# Patient Record
Sex: Female | Born: 1980 | Race: White | Hispanic: No | Marital: Married | Smoking: Never smoker
Health system: Southern US, Community
[De-identification: ages and names within clinical notes are randomized; demographics above are authoritative.]

## PROBLEM LIST (undated history)

## (undated) ENCOUNTER — Inpatient Hospital Stay (HOSPITAL_COMMUNITY): Payer: Self-pay

## (undated) DIAGNOSIS — E079 Disorder of thyroid, unspecified: Secondary | ICD-10-CM

## (undated) DIAGNOSIS — R112 Nausea with vomiting, unspecified: Secondary | ICD-10-CM

## (undated) DIAGNOSIS — E282 Polycystic ovarian syndrome: Secondary | ICD-10-CM

## (undated) DIAGNOSIS — Z8639 Personal history of other endocrine, nutritional and metabolic disease: Secondary | ICD-10-CM

## (undated) DIAGNOSIS — D649 Anemia, unspecified: Secondary | ICD-10-CM

## (undated) DIAGNOSIS — H15009 Unspecified scleritis, unspecified eye: Secondary | ICD-10-CM

## (undated) DIAGNOSIS — N809 Endometriosis, unspecified: Secondary | ICD-10-CM

## (undated) DIAGNOSIS — N83209 Unspecified ovarian cyst, unspecified side: Secondary | ICD-10-CM

## (undated) DIAGNOSIS — Z9889 Other specified postprocedural states: Secondary | ICD-10-CM

## (undated) DIAGNOSIS — E039 Hypothyroidism, unspecified: Secondary | ICD-10-CM

## (undated) DIAGNOSIS — R519 Headache, unspecified: Secondary | ICD-10-CM

## (undated) DIAGNOSIS — D259 Leiomyoma of uterus, unspecified: Secondary | ICD-10-CM

## (undated) DIAGNOSIS — R7303 Prediabetes: Secondary | ICD-10-CM

## (undated) HISTORY — PX: OTHER SURGICAL HISTORY: SHX169

## (undated) HISTORY — PX: BREAST BIOPSY: SHX20

## (undated) HISTORY — PX: OVARIAN CYST REMOVAL: SHX89

## (undated) HISTORY — PX: TONSILLECTOMY: SUR1361

## (undated) HISTORY — PX: THYROIDECTOMY: SHX17

## (undated) HISTORY — DX: Unspecified scleritis, unspecified eye: H15.009

---

## 2004-08-23 HISTORY — PX: THYROIDECTOMY: SHX17

## 2010-10-21 ENCOUNTER — Ambulatory Visit: Payer: Self-pay | Admitting: Internal Medicine

## 2011-08-27 ENCOUNTER — Ambulatory Visit: Payer: Self-pay

## 2011-08-27 LAB — PREGNANCY, URINE: Pregnancy Test, Urine: NEGATIVE m[IU]/mL

## 2012-04-21 IMAGING — CR DG ABDOMEN 2V
1 series · 4 of 4 positions shown · non-contrast
Comparison: none

REASON FOR EXAM: Adbominal Pain; Nausea
COMMENTS:   LMP: One week ago

[Series 1: view not recorded · 0.17mm/px · 4 of 4 slices shown]
[im 1/4]
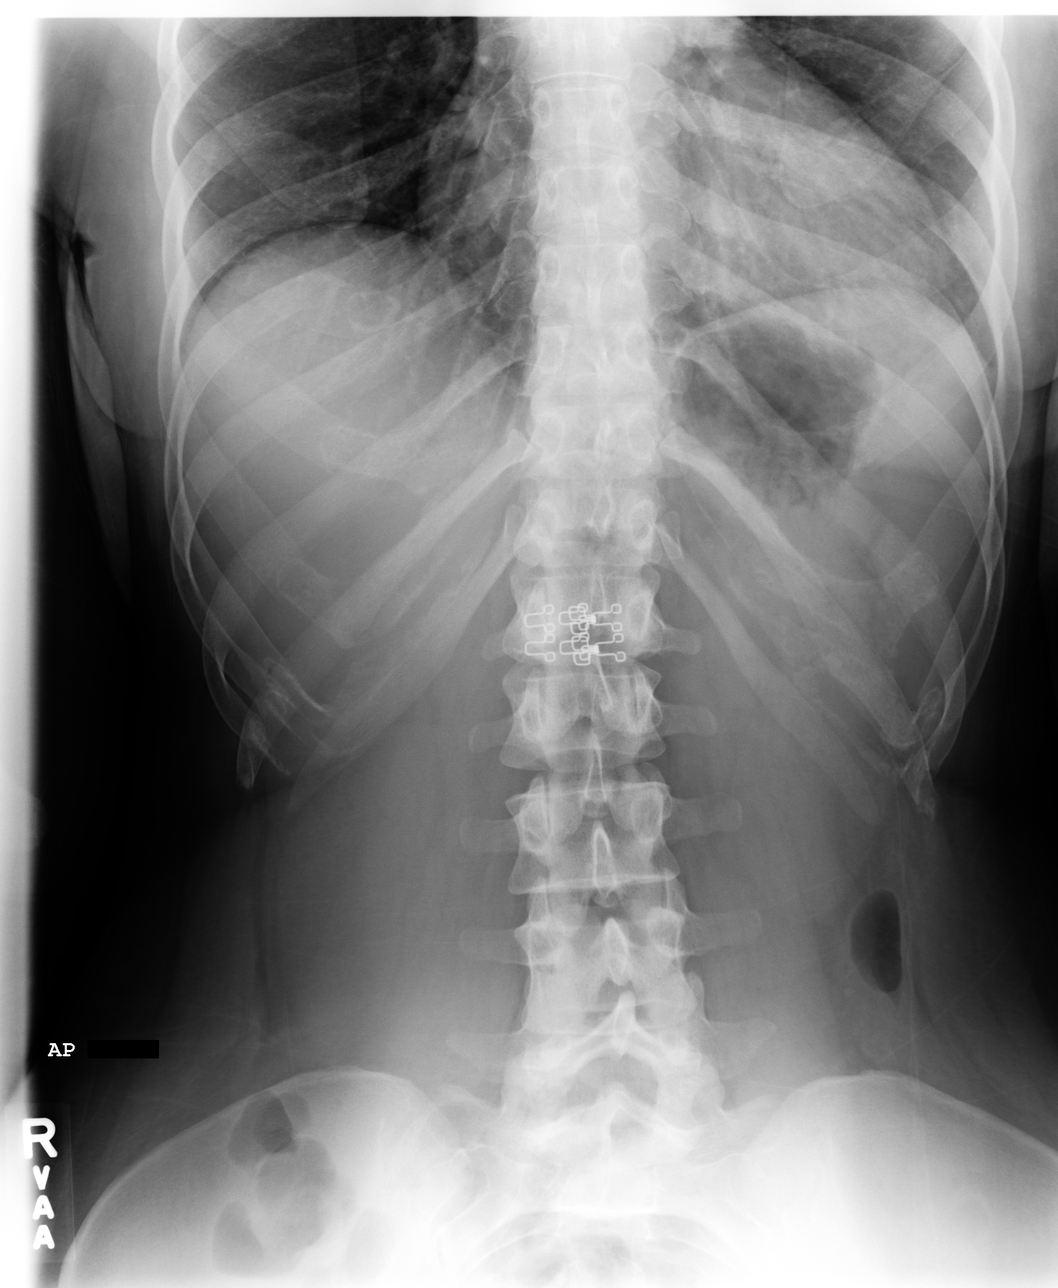
[im 2/4]
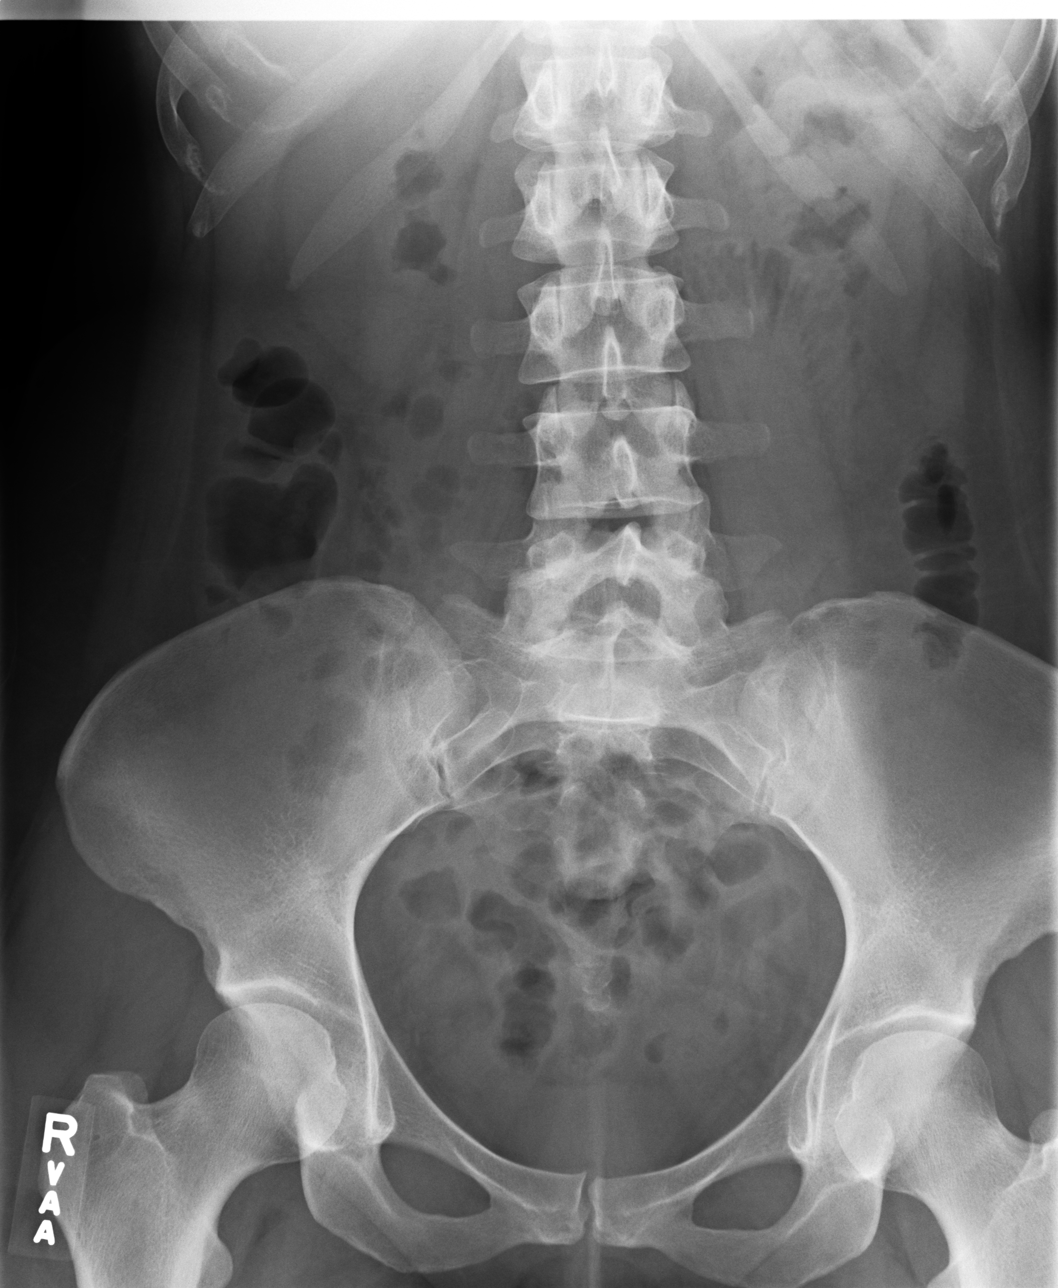
[im 3/4]
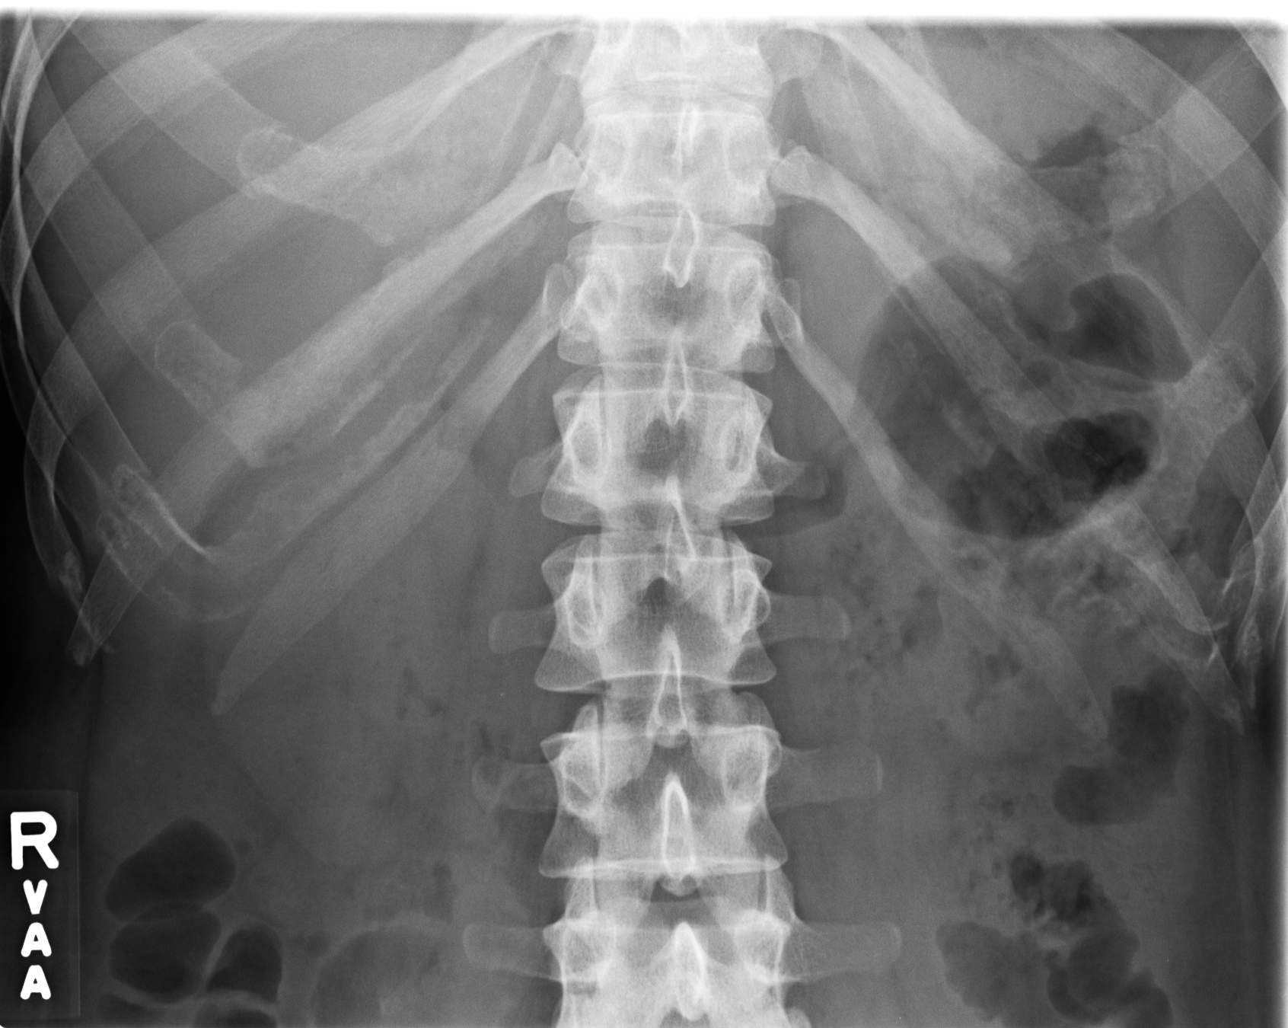
[im 4/4]
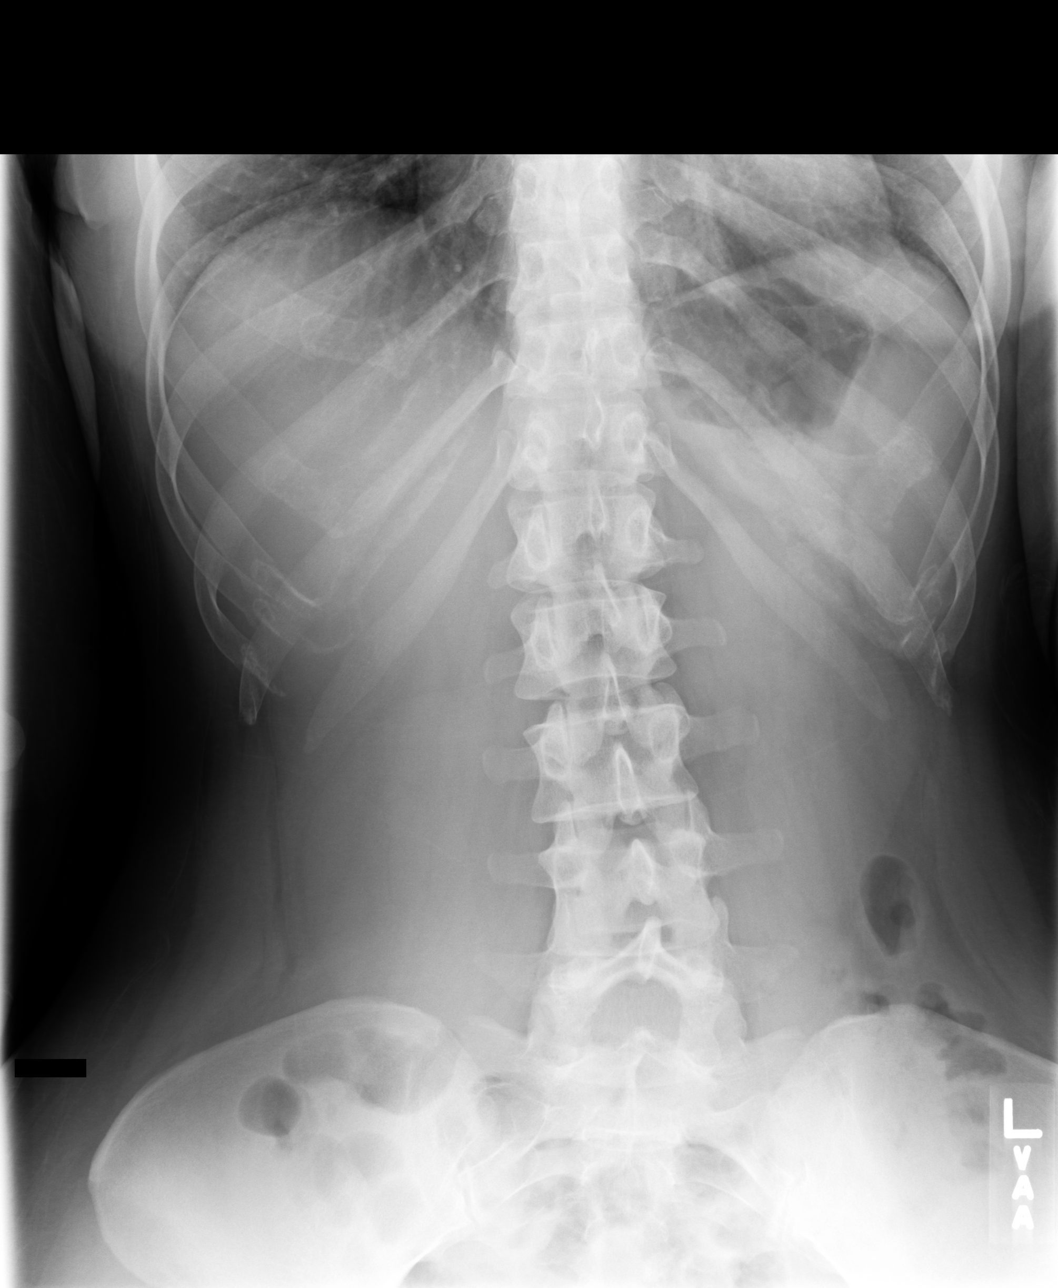

[4 of 4 positions shown; findings below may reference images not displayed]

PROCEDURE:     MDR - MDR ABDOMEN 2V FLAT AND ERECT  - October 21, 2010 [DATE]

RESULT:     Flat and erect views of the abdomen were obtained. No
subdiaphragmatic free air is seen. The bowel gas pattern is normal. No
abnormal intra-abdominal calcifications are seen. The osseous structures are
normal in appearance.
IMPRESSION: No significant abnormalities are noted.

## 2013-10-09 ENCOUNTER — Ambulatory Visit: Payer: Self-pay | Admitting: Family Medicine

## 2013-10-11 ENCOUNTER — Ambulatory Visit (INDEPENDENT_AMBULATORY_CARE_PROVIDER_SITE_OTHER): Payer: Federal, State, Local not specified - PPO | Admitting: Podiatrist

## 2013-10-11 ENCOUNTER — Ambulatory Visit (INDEPENDENT_AMBULATORY_CARE_PROVIDER_SITE_OTHER): Payer: Federal, State, Local not specified - PPO

## 2013-10-11 ENCOUNTER — Encounter: Payer: Self-pay | Admitting: Podiatrist

## 2013-10-11 VITALS — BP 132/85 | HR 81 | Resp 16 | Ht 65.0 in | Wt 200.0 lb

## 2013-10-11 DIAGNOSIS — R52 Pain, unspecified: Secondary | ICD-10-CM

## 2013-10-11 DIAGNOSIS — L923 Foreign body granuloma of the skin and subcutaneous tissue: Secondary | ICD-10-CM

## 2013-10-11 NOTE — Patient Instructions (Signed)
Wood Splinters Wood splinters need to be removed because they can cause skin irritation and infection. If they are close to the surface, splinters can usually be removed easily. Deep splinters may be hard to locate and need treatment by a surgeon. SPLINTER REMOVAL Removal of splinters by your caregiver is considered a surgical procedure.   The area is carefully cleaned. You may require a small amount of anaesthesia (medicine injected near the splinter to numb the tissue and lessen pain). After the splinter is removed, the area will be cleaned again. A bandage is applied.  If your splinter is under a fingernail or toenail, then a small section of the nail may need to be removed. As long as the splinter did not extend to the base of the nail, the nail usually grows back normally.  A splinter that is deeper, more contaminated, or that gets near a structure such as a bone, nerve or blood vessel may need to be removed by a surgeon.  You may need special X-rays or scans if the splinter is hard to locate.  Every attempt is made to remove the entire splinter. However, small particles may remain. Tell your caregiver if you feel that a part of the splinter was left behind. HOME CARE INSTRUCTIONS   Keep the injured area high up (elevated).  Use the injured area as little as possible.  Keep the injured area clean and dry. Follow any directions from your caregiver.  Keep any follow-up or wound check appointments. You might need a tetanus shot now if:  You have no idea when you had the last one.  You have never had a tetanus shot before.  The injured area had dirt in it. Even if you have already removed the splinter, call your caregiver to get a tetanus shot if you need one.  If you need a tetanus shot, and you decide not to get one, there is a rare chance of getting tetanus. Sickness from tetanus can be serious. If you did get a tetanus shot, your arm may swell, get red and warm to the touch at the  shot site. This is common and not a problem. SEEK MEDICAL CARE IF:   A splinter has been removed, but you are not better in a day or two.  You develop a temperature.  Signs of infection develop such as:  Redness, swelling or pus around the wound.  Red streaks spreading back from your wound towards your body. Document Released: 09/16/2004 Document Revised: 11/01/2011 Document Reviewed: 08/19/2008 [employer] Patient Information 2014 [employer].  

## 2013-10-11 NOTE — Progress Notes (Signed)
   Subjective:    Patient ID: Meghan Leach, female    DOB: 03/17/1981, 33 y.o.   MRN: [id number]  HPI Comments: Two weeks ago got a splinter from wood floors in my foot, thought had got it all out , but now it is raised and i cant put any pressure on that part of foot, went to urgent care on Monday they put me on a antibiotic.   patient was seen at the urgent care for bronchitis and now she's on it about for this. She was not put on antibiotics for the splinter. Relates she cannot walk normally on the foot and that she's been trying to get the splinter out and has been unable to.    Review of Systems  Allergic/Immunologic: Positive for environmental allergies.  All other systems reviewed and are negative.       Objective:   Physical Exam  GENERAL APPEARANCE: Alert, conversant. Appropriately groomed. No acute distress.  VASCULAR: Pedal pulses palpable at 2/4 DP and PT bilateral.  Capillary refill time is immediate to all digits,  Proximal to distal cooling it warm to warm.  Digital hair growth is present bilateral  NEUROLOGIC: sensation is intact epicritically and protectively to 5.07 monofilament at 5/5 sites bilateral.  Light touch is intact bilateral, vibratory sensation intact bilateral, achilles tendon reflex is intact bilateral.  MUSCULOSKELETAL: acceptable muscle strength, tone and stability bilateral.  Intrinsic muscluature intact bilateral.  Rectus appearance of foot and digits noted bilateral.   DERMATOLOGIC: Splinter/foreign body is present on the lateral aspect of the left foot mid diaphyseal region of the fifth metatarsal. Direct pressure and lateral compression is uncomfortable. X-rays reveal no sign of a foreign body at the marked site.    Assessment & Plan:  Foreign body/splint her left foot  Plan: Recommended excision of the splinter and the patient agreed I prepped the skin with alcohol and infiltrated 2% lidocaine plain in the area of the splinter. The patient tolerated  this well. I then prepped the skin with Betadine solution and was able to extract the splinter with a #15 blade. The entirety of the splinter was removed. I then applied iodine ointment and a Band-Aid. Instructed the patient to do Epsom salts soaks for a couple of days and the pain should resolve. Offloading pads were also dispensed for her use. She'll be seen back if the pain does not resolve completely.

## 2015-04-10 IMAGING — CR DG CHEST 2V
1 series · 2 of 2 positions shown · non-contrast
Comparison: None.

CLINICAL DATA: Cough.

EXAM:
CHEST  2 VIEW

[Series 1: pa · 0.17mm/px · 2 of 2 slices shown]
[im 1/2]
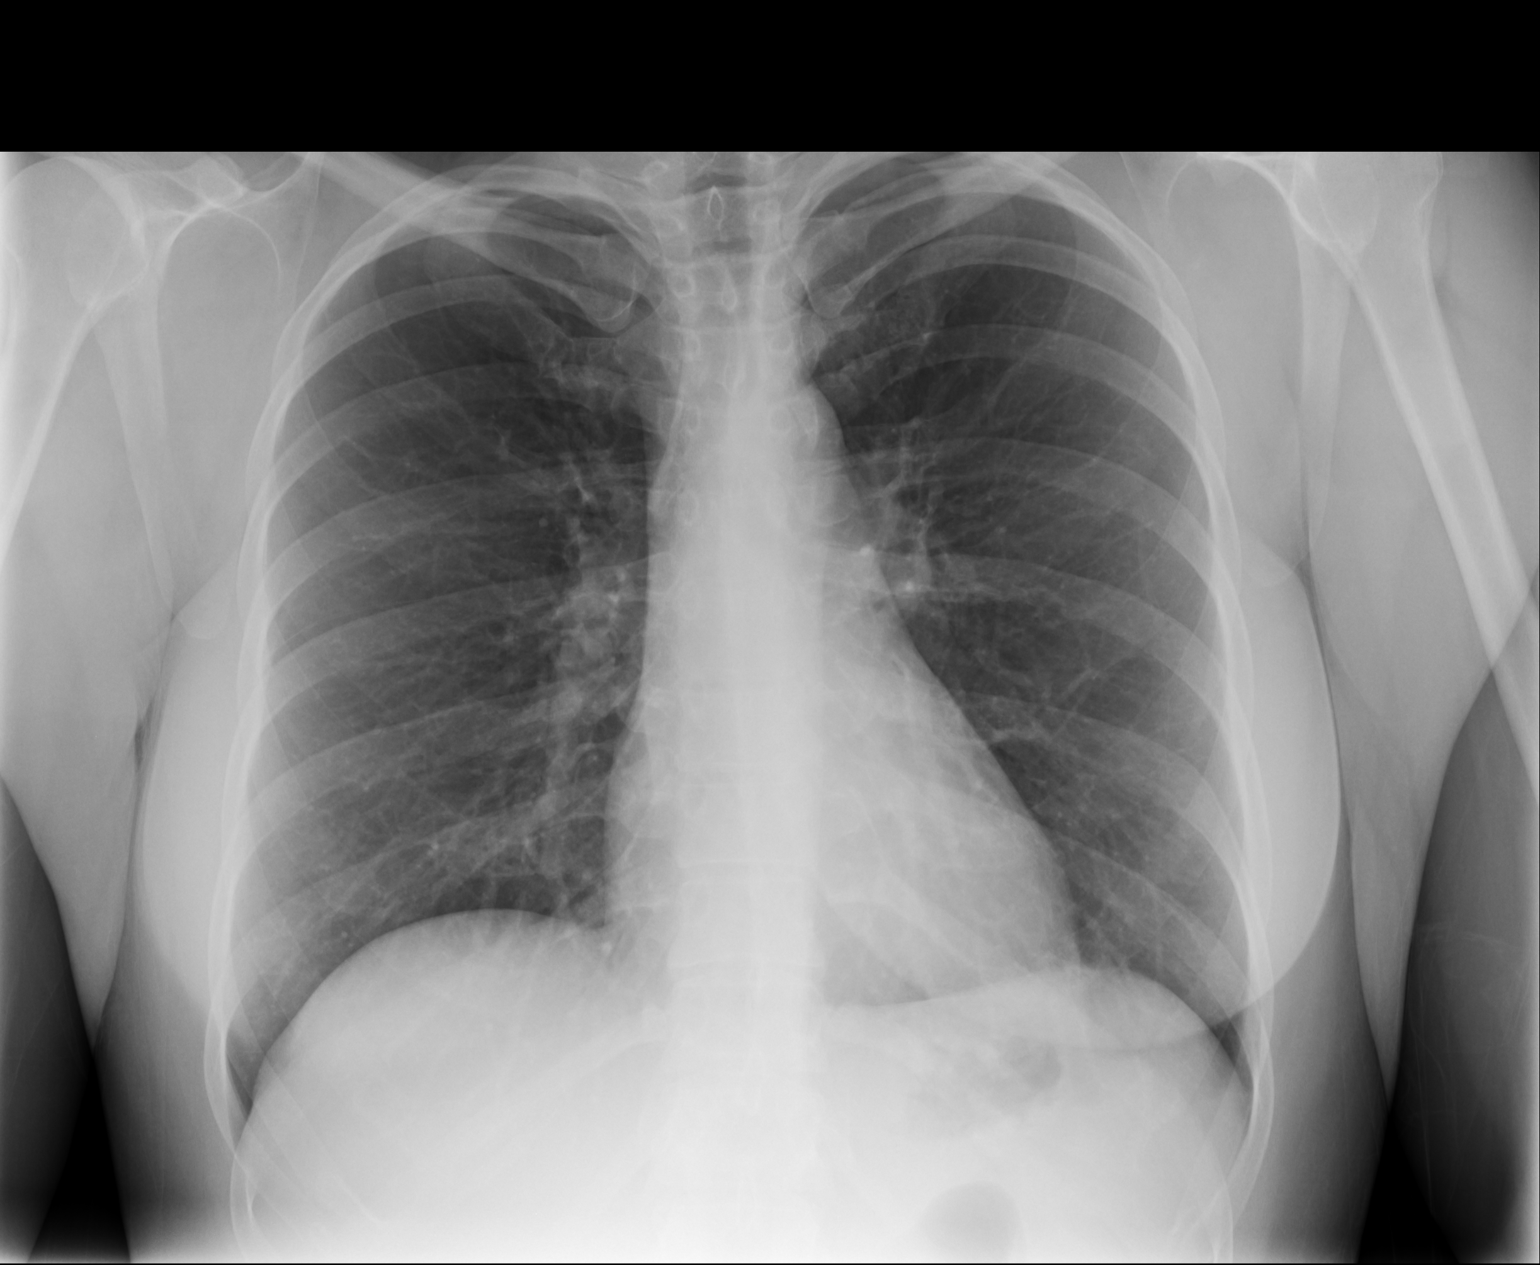
[im 2/2]
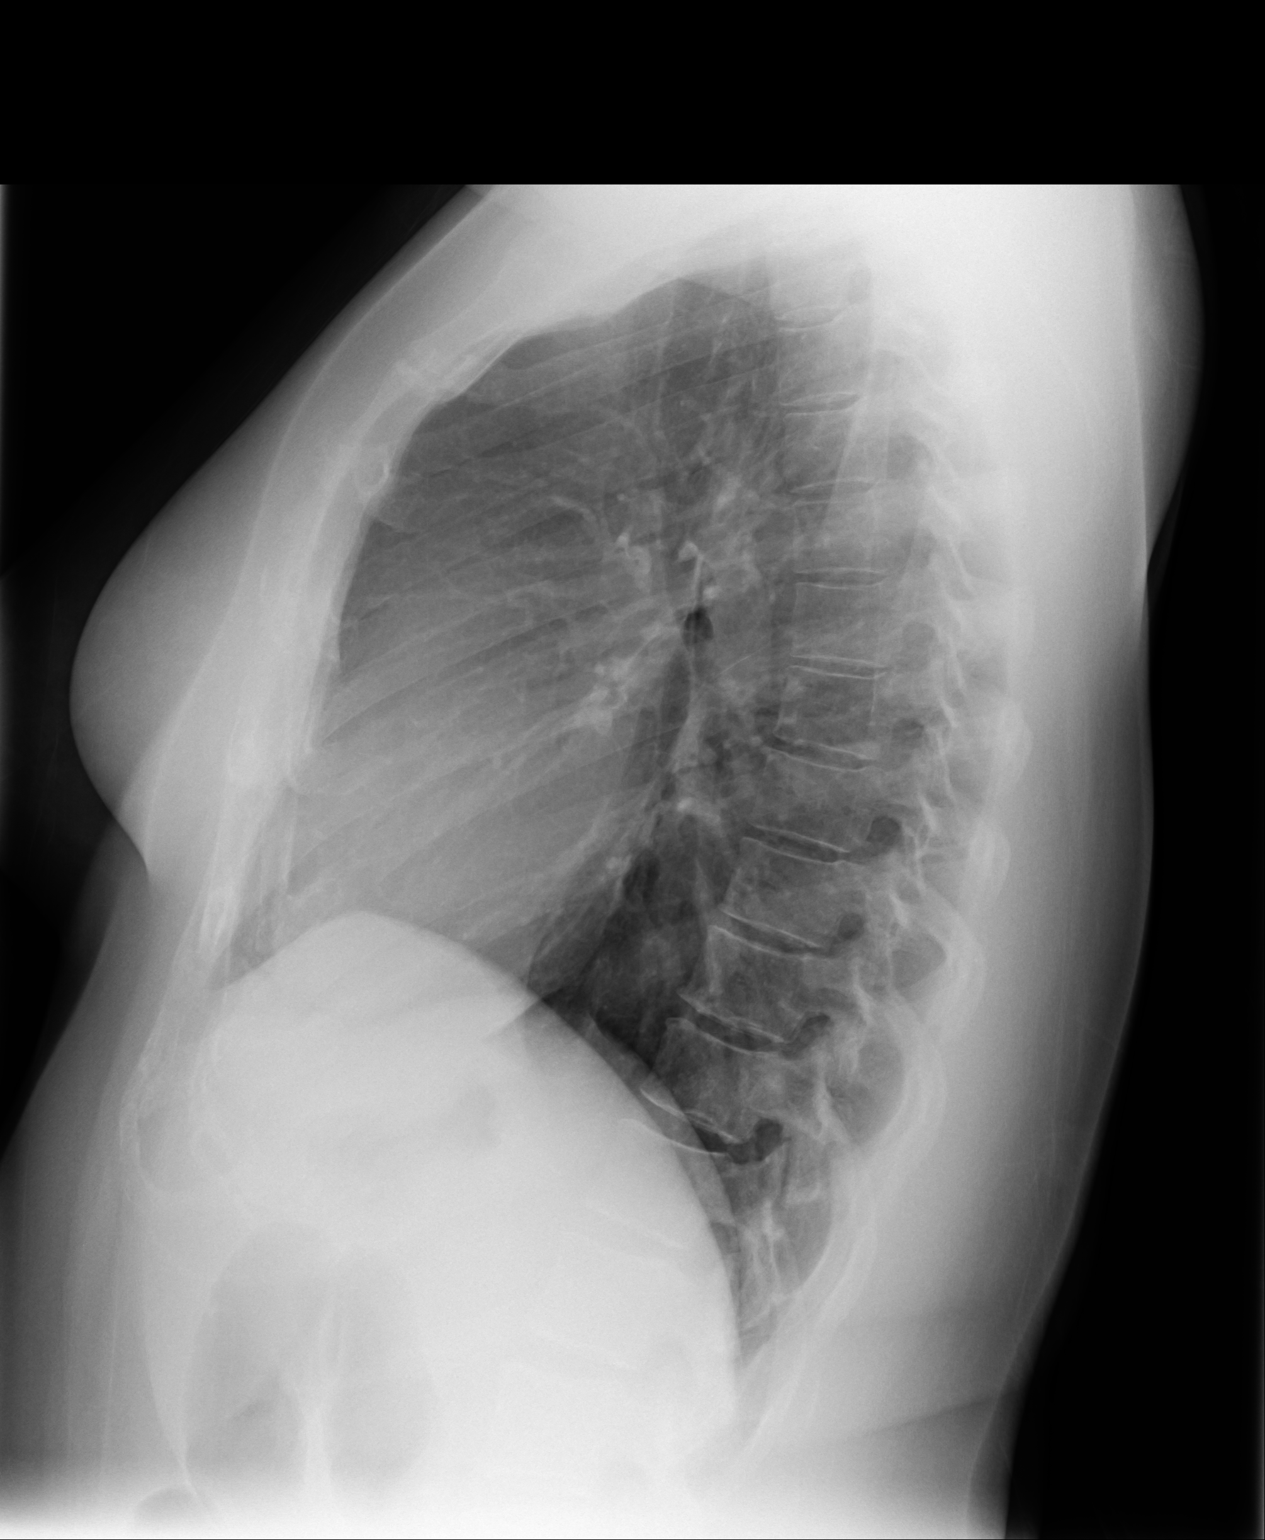

[2 of 2 positions shown; findings below may reference images not displayed]

FINDINGS: The heart size and mediastinal contours are within normal limits.
Both lungs are clear. The visualized skeletal structures are
unremarkable.
IMPRESSION: Normal chest x-ray.

## 2015-05-18 ENCOUNTER — Ambulatory Visit
Admission: EM | Admit: 2015-05-18 | Discharge: 2015-05-18 | Disposition: A | Discharge status: Home / Self Care | Payer: Federal, State, Local not specified - PPO | Attending: Internal Medicine | Admitting: Internal Medicine

## 2015-05-18 ENCOUNTER — Encounter: Payer: Self-pay | Admitting: Gynecology

## 2015-05-18 DIAGNOSIS — J0110 Acute frontal sinusitis, unspecified: Secondary | ICD-10-CM

## 2015-05-18 DIAGNOSIS — J0100 Acute maxillary sinusitis, unspecified: Secondary | ICD-10-CM | POA: Diagnosis not present

## 2015-05-18 DIAGNOSIS — J029 Acute pharyngitis, unspecified: Secondary | ICD-10-CM

## 2015-05-18 HISTORY — DX: Polycystic ovarian syndrome: E28.2

## 2015-05-18 HISTORY — DX: Prediabetes: R73.03

## 2015-05-18 HISTORY — DX: Disorder of thyroid, unspecified: E07.9

## 2015-05-18 LAB — CBC WITH DIFFERENTIAL/PLATELET
BASOS ABS: 0.1 10*3/uL (ref 0–0.1)
Basophils Relative: 1 %
EOS PCT: 1 %
Eosinophils Absolute: 0.1 10*3/uL (ref 0–0.7)
HCT: 41.4 % (ref 35.0–47.0)
Hemoglobin: 14.1 g/dL (ref 12.0–16.0)
LYMPHS PCT: 7 %
Lymphs Abs: 1.1 10*3/uL (ref 1.0–3.6)
MCH: 26.5 pg (ref 26.0–34.0)
MCHC: 34.1 g/dL (ref 32.0–36.0)
MCV: 77.6 fL — AB (ref 80.0–100.0)
MONO ABS: 1.1 10*3/uL — AB (ref 0.2–0.9)
MONOS PCT: 7 %
Neutro Abs: 12.2 10*3/uL — ABNORMAL HIGH (ref 1.4–6.5)
Neutrophils Relative %: 84 %
PLATELETS: 233 10*3/uL (ref 150–440)
RBC: 5.33 MIL/uL — ABNORMAL HIGH (ref 3.80–5.20)
RDW: 13.1 % (ref 11.5–14.5)
WBC: 14.5 10*3/uL — ABNORMAL HIGH (ref 3.6–11.0)

## 2015-05-18 LAB — MONONUCLEOSIS SCREEN: Mono Screen: NEGATIVE

## 2015-05-18 LAB — RAPID STREP SCREEN (MED CTR MEBANE ONLY): STREPTOCOCCUS, GROUP A SCREEN (DIRECT): NEGATIVE

## 2015-05-18 MED ORDER — DEXAMETHASONE SODIUM PHOSPHATE 10 MG/ML IJ SOLN
10.0000 mg | Freq: Once | INTRAMUSCULAR | Status: AC
  Administered 2015-05-18: 10 mg via INTRAMUSCULAR

## 2015-05-18 MED ORDER — AZITHROMYCIN 250 MG PO TABS: ORAL_TABLET | ORAL | Status: DC

## 2015-05-18 MED ORDER — IBUPROFEN 800 MG PO TABS: 800.0000 mg | ORAL_TABLET | Freq: Three times a day (TID) | ORAL | Status: DC | PRN

## 2015-05-18 NOTE — Discharge Instructions (Signed)
Take medication as prescribed. Rest. Drink plenty of fluids.   Follow up with your primary care physician closely this week. Return to Urgent care for new or worsening concerns.   Sinusitis Sinusitis is redness, soreness, and inflammation of the paranasal sinuses. Paranasal sinuses are air pockets within the bones of your face (beneath the eyes, the middle of the forehead, or above the eyes). In healthy paranasal sinuses, mucus is able to drain out, and air is able to circulate through them by way of your nose. However, when your paranasal sinuses are inflamed, mucus and air can become trapped. This can allow bacteria and other germs to grow and cause infection. Sinusitis can develop quickly and last only a short time (acute) or continue over a long period (chronic). Sinusitis that lasts for more than 12 weeks is considered chronic.  CAUSES  Causes of sinusitis include:  Allergies.  Structural abnormalities, such as displacement of the cartilage that separates your nostrils (deviated septum), which can decrease the air flow through your nose and sinuses and affect sinus drainage.  Functional abnormalities, such as when the small hairs (cilia) that line your sinuses and help remove mucus do not work properly or are not present. SIGNS AND SYMPTOMS  Symptoms of acute and chronic sinusitis are the same. The primary symptoms are pain and pressure around the affected sinuses. Other symptoms include:  Upper toothache.  Earache.  Headache.  Bad breath.  Decreased sense of smell and taste.  A cough, which worsens when you are lying flat.  Fatigue.  Fever.  Thick drainage from your nose, which often is green and may contain pus (purulent).  Swelling and warmth over the affected sinuses. DIAGNOSIS  Your health care provider will perform a physical exam. During the exam, your health care provider may:  Look in your nose for signs of abnormal growths in your nostrils (nasal polyps).  Tap  over the affected sinus to check for signs of infection.  View the inside of your sinuses (endoscopy) using an imaging device that has a light attached (endoscope). If your health care provider suspects that you have chronic sinusitis, one or more of the following tests may be recommended:  Allergy tests.  Nasal culture. A sample of mucus is taken from your nose, sent to a lab, and screened for bacteria.  Nasal cytology. A sample of mucus is taken from your nose and examined by your health care provider to determine if your sinusitis is related to an allergy. TREATMENT  Most cases of acute sinusitis are related to a viral infection and will resolve on their own within 10 days. Sometimes medicines are prescribed to help relieve symptoms (pain medicine, decongestants, nasal steroid sprays, or saline sprays).  However, for sinusitis related to a bacterial infection, your health care provider will prescribe antibiotic medicines. These are medicines that will help kill the bacteria causing the infection.  Rarely, sinusitis is caused by a fungal infection. In theses cases, your health care provider will prescribe antifungal medicine. For some cases of chronic sinusitis, surgery is needed. Generally, these are cases in which sinusitis recurs more than 3 times per year, despite other treatments. HOME CARE INSTRUCTIONS   Drink plenty of water. Water helps thin the mucus so your sinuses can drain more easily.  Use a humidifier.  Inhale steam 3 to 4 times a day (for example, sit in the bathroom with the shower running).  Apply a warm, moist washcloth to your face 3 to 4 times a day,  or as directed by your health care provider.  Use saline nasal sprays to help moisten and clean your sinuses.  Take medicines only as directed by your health care provider.  If you were prescribed either an antibiotic or antifungal medicine, finish it all even if you start to feel better. SEEK IMMEDIATE MEDICAL CARE  IF:  You have increasing pain or severe headaches.  You have nausea, vomiting, or drowsiness.  You have swelling around your face.  You have vision problems.  You have a stiff neck.  You have difficulty breathing. MAKE SURE YOU:   Understand these instructions.  Will watch your condition.  Will get help right away if you are not doing well or get worse. Document Released: 08/09/2005 Document Revised: 12/24/2013 Document Reviewed: 08/24/2011 [facility] Patient Information 2015 [place], [place]. This information is not intended to replace advice given to you by your health care provider. Make sure you discuss any questions you have with your health care provider.

## 2015-05-18 NOTE — ED Notes (Signed)
Patient c/o x 2 days sinus infection / sore throat / mucous drainage/ nasal drip. Per patient just came back vacation x 2 days ago.

## 2015-05-18 NOTE — ED Provider Notes (Signed)
[facility] Emergency Department Provider Note  ____________________________________________  Time seen: Approximately 5:36 PM  I have reviewed the triage vital signs and the nursing notes.   HISTORY  Chief Complaint Sore Throat and URI   HPI Meghan Leach is a 34 y.o. female  presents with a complaint of 1.5 weeks of runny nose, congestion, sore throat and sinus pressure. Patient reports intermittent cough and states cough is mostly at night with nasal drainage. States she has been taking over-the-counter Mucinex, ibuprofen, Allegra-D and Flonase with some improvement but no resolution. States that she has been trying to rest "to get this to go away" at home but does not seem to be resolving.States sore throat and nasal congestion increased over last two days.   Patient reports that she has been around her family recently this past week with several people who had bronchitis as well as sinusitis. Denies recent insect or tick bite.   Reports continues to eat and drink well. Denies chest pain, shortness of breath, abdominal pain, headache, neck or back pain, rash or other complaints. Denies recent travel out of the country. Denies dysuria, abdominal pain, pelvic pain.  Past Medical History  Diagnosis Date  . Pre-diabetes   . PCOS (polycystic ovarian syndrome)   . Thyroid disease     There are no active problems to display for this patient.   Past Surgical History  Procedure Laterality Date  . Thyriod    . Ovarian cyst removal     Denies pregnancy. Denies chance of current pregnancy.   Current Outpatient Rx  Name  Route  Sig  Dispense  Refill  . DiphenhydrAMINE HCl (BENADRYL ALLERGY PO)   Oral   Take by mouth.         . Fexofenadine HCl (ALLEGRA PO)   Oral   Take by mouth.         . liothyronine (CYTOMEL) 25 MCG tablet   Oral   Take by mouth daily.         Marland Kitchen thyroid (ARMOUR) 60 MG tablet   Oral   Take 60 mg by mouth daily before  breakfast.         .           .             Allergies Metformin and related  No family history on file.  Social History Social History  Substance Use Topics  . Smoking status: Never Smoker   . Smokeless tobacco: Never Used  . Alcohol Use: No    Review of Systems Constitutional: No fever/chills Eyes: No visual changes. JQG:BEEFEOFH runny nose, congestion, sinus pressure and sore throat.  Cardiovascular: Denies chest pain. Respiratory: Denies shortness of breath. Gastrointestinal: No abdominal pain.  No nausea, no vomiting.  No diarrhea.  No constipation. Genitourinary: Negative for dysuria. Musculoskeletal: Negative for back pain. Skin: Negative for rash. Neurological: Negative for headaches, focal weakness or numbness.  10-point ROS otherwise negative.  ____________________________________________   PHYSICAL EXAM:  VITAL SIGNS: ED Triage Vitals  Enc Vitals Group     BP 05/18/15 1552 127/84 mmHg     Pulse Rate 05/18/15 1552 108, recheck 92     Resp 05/18/15 1552 18     Temp 05/18/15 1552 98.5 F (36.9 C)     Temp Source 05/18/15 1552 Oral     SpO2 05/18/15 1552 100 %     Weight 05/18/15 1552 211 lb (95.709 kg)     Height 05/18/15  1552 5\' 5"  (1.651 m)     Head Cir --      Peak Flow --      Pain Score 05/18/15 1602 8     Pain Loc --      Pain Edu? --      Excl. in [place]? --     Constitutional: Alert and oriented. Well appearing and in no acute distress. Eyes: Conjunctivae are normal. PERRL. EOMI. Head: Atraumatic.mod TTP maxillary and frontal sinus TTP. No erythema or swelling noted.   Ears: no erythema, bilateral dullness. TM intact. Right serous otitis.   Nose: mod purulent nasal drainage, bilateral nasal turbinate edema with bogginess, nares patent.   Mouth/Throat: Mucous membranes are moist.  Mild to mod pharyngeal erythema, no tonsillar swelling or exudate. No uvular shift or deviation.  Neck: No stridor.  No cervical spine tenderness to  palpation. Hematological/Lymphatic/Immunilogical: No cervical lymphadenopathy. Cardiovascular: Normal rate, regular rhythm. Grossly normal heart sounds.  Good peripheral circulation. Respiratory: Normal respiratory effort.  No retractions. Lungs CTAB. Gastrointestinal: Soft and nontender. No distention. Normal Bowel sounds.  No abdominal bruits. No CVA tenderness.No hepatomegaly, No splenomegaly, palpated.  Musculoskeletal: No lower or upper extremity tenderness nor edema.  No joint effusions. Bilateral pedal pulses equal and easily palpated.  Neurologic:  Normal speech and language. No gross focal neurologic deficits are appreciated. No gait instability. Skin:  Skin is warm, dry and intact. No rash noted. Psychiatric: Mood and affect are normal. Speech and behavior are normal.  ____________________________________________   LABS (all labs ordered are listed, but only abnormal results are displayed)  Labs Reviewed  CBC WITH DIFFERENTIAL/PLATELET - Abnormal; Notable for the following:    WBC 14.5 (*)    RBC 5.33 (*)    MCV 77.6 (*)    Neutro Abs 12.2 (*)    Monocytes Absolute 1.1 (*)    All other components within normal limits  RAPID STREP SCREEN (NOT AT [facility])  CULTURE, GROUP A STREP ([facility] ONLY)  MONONUCLEOSIS SCREEN   _________________________________________   INITIAL IMPRESSION / ASSESSMENT AND PLAN / ED COURSE  Pertinent labs & imaging results that were available during my care of the patient were reviewed by me and considered in my medical decision making (see chart for details).  Well-appearing patient. No acute distress. Presents for the complaints of runny nose, nasal congestion, sinus pressure as well as sore throat 1.5 weeks. Reports recently several sick contacts including family members with bronchitis and sinusitis. Lungs clear throughout. Abdomen soft and nontender.   Strep negative, mono negative, CBC reviewed. WBC 14.5. Patient tolerating fluids in room.  Afebrile. Moderate tenderness palpation maxillary and frontal sinus with nasal turbinate edema and bogginess. Will treat patient sinusitis with oral azithromycin as well as supportive treatments. Discussed supportive treatments such as rest, fluids, otc ibuprofen or Tylenol as needed. Will also proceed with 10 mg IM Decadron at urgent care for pharyngitis as well as sinusitis . Counseled as patient reports pre-diabetic to drink plenty of water, and monitor glucose as decadron may increase. Discussed follow up with Primary care physician, Dr Moshe Cipro,  this week. Discussed follow up and return parameters including no resolution or any worsening concerns. Patient verbalized understanding and agreed to plan.   ____________________________________________    FINAL CLINICAL IMPRESSION(S) / ED DIAGNOSES  Final diagnoses:  Acute maxillary sinusitis, recurrence not specified  Acute frontal sinusitis, recurrence not specified  Pharyngitis       Marylene Land, NP 05/18/15 1841  Marylene Land, NP 05/18/15 1900

## 2015-05-20 LAB — CULTURE, GROUP A STREP (THRC)

## 2015-07-02 ENCOUNTER — Ambulatory Visit (INDEPENDENT_AMBULATORY_CARE_PROVIDER_SITE_OTHER): Payer: Federal, State, Local not specified - PPO

## 2015-07-02 ENCOUNTER — Encounter: Payer: Self-pay | Admitting: Podiatry

## 2015-07-02 ENCOUNTER — Ambulatory Visit (INDEPENDENT_AMBULATORY_CARE_PROVIDER_SITE_OTHER): Payer: Federal, State, Local not specified - PPO | Admitting: Podiatry

## 2015-07-02 VITALS — BP 136/92 | HR 74 | Resp 16

## 2015-07-02 DIAGNOSIS — M779 Enthesopathy, unspecified: Secondary | ICD-10-CM | POA: Diagnosis not present

## 2015-07-02 DIAGNOSIS — M79673 Pain in unspecified foot: Secondary | ICD-10-CM | POA: Diagnosis not present

## 2015-07-02 DIAGNOSIS — R600 Localized edema: Secondary | ICD-10-CM | POA: Diagnosis not present

## 2015-07-02 MED ORDER — PREDNISONE 10 MG PO TABS: ORAL_TABLET | ORAL | Status: DC

## 2015-07-02 NOTE — Patient Instructions (Addendum)

## 2015-07-02 NOTE — Progress Notes (Signed)
Subjective:     Patient ID: Meghan Leach, female   DOB: 01-21-1981, 34 y.o.   MRN: [id number]  HPI patient presents with pain in the left foot and also the right foot with the discomfort in the left being worse and present for about 2 months. States it's hard to walk comfortably and there is swelling   Review of Systems  All other systems reviewed and are negative.      Objective:   Physical Exam  Constitutional: She is oriented to person, place, and time.  Cardiovascular: Intact distal pulses.   Musculoskeletal: Normal range of motion.  Neurological: She is oriented to person, place, and time.  Skin: Skin is warm.  Nursing note and vitals reviewed.  neurovascular status intact muscle strength adequate range of motion within normal limits with patient noted to have significant discomfort in the plantar aspect left foot proximal to the metatarsal heads diffuse in nature and mild discomfort in the dorsum of the right foot. Good digital perfusion noted well oriented 3     Assessment:      inflammatory changes left which may be tenderness or capsule or related or possibly could be systemic    Plan:      H&P and x-rays reviewed with patient. Applied air fracture walker left with instructions on usage and placed on sterile DS 12 day Dosepak and reappoint to recheck again in 2 weeks.

## 2015-07-02 NOTE — Progress Notes (Signed)
   Subjective:    Patient ID: Meghan Leach, female    DOB: 1981-04-20, 34 y.o.   MRN: [id number]  HPI Pt presents with ongoing foot pain x 2 months. Left foot pain is located plantar forefoot and dorsal, also radiates laterally. Right foot pain in located on the plantar forefoot aspect. She has tried adjustments, nsaids and othe therapies with no relief   Review of Systems  All other systems reviewed and are negative.      Objective:   Physical Exam        Assessment & Plan:

## 2015-07-16 ENCOUNTER — Encounter: Payer: Self-pay | Admitting: Podiatry

## 2015-07-16 ENCOUNTER — Ambulatory Visit (INDEPENDENT_AMBULATORY_CARE_PROVIDER_SITE_OTHER): Payer: Federal, State, Local not specified - PPO | Admitting: Podiatry

## 2015-07-16 ENCOUNTER — Ambulatory Visit (INDEPENDENT_AMBULATORY_CARE_PROVIDER_SITE_OTHER): Payer: Federal, State, Local not specified - PPO

## 2015-07-16 VITALS — BP 99/71 | HR 59 | Resp 16

## 2015-07-16 DIAGNOSIS — M779 Enthesopathy, unspecified: Secondary | ICD-10-CM | POA: Diagnosis not present

## 2015-07-16 DIAGNOSIS — R600 Localized edema: Secondary | ICD-10-CM | POA: Diagnosis not present

## 2015-07-17 NOTE — Progress Notes (Signed)
Subjective:     Patient ID: Meghan Leach, female   DOB: 09/05/1980, 34 y.o.   MRN: [id number]  HPI patient states I'm still getting a lot of pain on top of my left foot and it seems swollen still. The boot helps but what I don't wear it the pain is still quite noticeable   Review of Systems     Objective:   Physical Exam Neurovascular status intact muscle strength adequate with dorsal discomfort left midfoot and distal with edema noted and pain when palpated. It is localized in nature and I did not note any proximal indications of problems or positive Homans sign    Assessment:     Appears to be localized inflammatory process which may be systemic or local in nature and cannot rule out the possibility for stress fracture or underlying bone condition    Plan:     Reviewed condition with patient and discussed continued immobilization and placed on diclofenac 75 mg twice a day. Reviewed x-rays indicating no stress fracture and at this time I applied an Unna boot to try to reduce the edematous process and advised her to wear for 4 days and less it bother her. Patient will be seen back in about 3 weeks and may need more extensive testing if symptoms persist

## 2015-07-21 ENCOUNTER — Telehealth: Payer: Self-pay | Admitting: *Deleted

## 2015-07-21 MED ORDER — DICLOFENAC SODIUM 75 MG PO TBEC: 75.0000 mg | DELAYED_RELEASE_TABLET | Freq: Two times a day (BID) | ORAL | Status: DC

## 2015-07-21 NOTE — Telephone Encounter (Signed)
Pt states the antiinflammatory medication was not called to the pharmacy after her last appt with Dr. Paulla Dolly.  Dr. Paulla Dolly ordered Voltaren 75mg  #60 1 tablet bid.

## 2015-07-24 ENCOUNTER — Encounter: Payer: Self-pay | Admitting: Emergency Medicine

## 2015-07-24 ENCOUNTER — Ambulatory Visit
Admission: EM | Admit: 2015-07-24 | Discharge: 2015-07-24 | Disposition: A | Discharge status: Home / Self Care | Payer: Federal, State, Local not specified - PPO | Attending: Emergency Medicine | Admitting: Emergency Medicine

## 2015-07-24 DIAGNOSIS — N390 Urinary tract infection, site not specified: Secondary | ICD-10-CM | POA: Diagnosis not present

## 2015-07-24 LAB — URINALYSIS COMPLETE WITH MICROSCOPIC (ARMC ONLY)
BILIRUBIN URINE: NEGATIVE
Glucose, UA: NEGATIVE mg/dL
KETONES UR: NEGATIVE mg/dL
Nitrite: NEGATIVE
Protein, ur: NEGATIVE mg/dL
SPECIFIC GRAVITY, URINE: 1.01 (ref 1.005–1.030)
pH: 7 (ref 5.0–8.0)

## 2015-07-24 LAB — PREGNANCY, URINE: Preg Test, Ur: NEGATIVE

## 2015-07-24 MED ORDER — PHENAZOPYRIDINE HCL 100 MG PO TABS: 200.0000 mg | ORAL_TABLET | Freq: Three times a day (TID) | ORAL | Status: DC | PRN

## 2015-07-24 MED ORDER — SULFAMETHOXAZOLE-TRIMETHOPRIM 800-160 MG PO TABS: 1.0000 | ORAL_TABLET | Freq: Two times a day (BID) | ORAL | Status: AC

## 2015-07-24 NOTE — Discharge Instructions (Signed)
Take medication as prescribed. Drink plenty of fluids. Rest.   Follow up your primary care physician next week.   Return to Urgent care for new or worsening concerns.   Urinary Tract Infection Urinary tract infections (UTIs) can develop anywhere along your urinary tract. Your urinary tract is your body's drainage system for removing wastes and extra water. Your urinary tract includes two kidneys, two ureters, a bladder, and a urethra. Your kidneys are a pair of bean-shaped organs. Each kidney is about the size of your fist. They are located below your ribs, one on each side of your spine. CAUSES Infections are caused by microbes, which are microscopic organisms, including fungi, viruses, and bacteria. These organisms are so small that they can only be seen through a microscope. Bacteria are the microbes that most commonly cause UTIs. SYMPTOMS  Symptoms of UTIs may vary by age and gender of the patient and by the location of the infection. Symptoms in young women typically include a frequent and intense urge to urinate and a painful, burning feeling in the bladder or urethra during urination. Older women and men are more likely to be tired, shaky, and weak and have muscle aches and abdominal pain. A fever may mean the infection is in your kidneys. Other symptoms of a kidney infection include pain in your back or sides below the ribs, nausea, and vomiting. DIAGNOSIS To diagnose a UTI, your caregiver will ask you about your symptoms. Your caregiver will also ask you to provide a urine sample. The urine sample will be tested for bacteria and white blood cells. White blood cells are made by your body to help fight infection. TREATMENT  Typically, UTIs can be treated with medication. Because most UTIs are caused by a bacterial infection, they usually can be treated with the use of antibiotics. The choice of antibiotic and length of treatment depend on your symptoms and the type of bacteria causing your  infection. HOME CARE INSTRUCTIONS  If you were prescribed antibiotics, take them exactly as your caregiver instructs you. Finish the medication even if you feel better after you have only taken some of the medication.  Drink enough water and fluids to keep your urine clear or pale yellow.  Avoid caffeine, tea, and carbonated beverages. They tend to irritate your bladder.  Empty your bladder often. Avoid holding urine for long periods of time.  Empty your bladder before and after sexual intercourse.  After a bowel movement, women should cleanse from front to back. Use each tissue only once. SEEK MEDICAL CARE IF:   You have back pain.  You develop a fever.  Your symptoms do not begin to resolve within 3 days. SEEK IMMEDIATE MEDICAL CARE IF:   You have severe back pain or lower abdominal pain.  You develop chills.  You have nausea or vomiting.  You have continued burning or discomfort with urination. MAKE SURE YOU:   Understand these instructions.  Will watch your condition.  Will get help right away if you are not doing well or get worse.   This information is not intended to replace advice given to you by your health care provider. Make sure you discuss any questions you have with your health care provider.   Document Released: 05/19/2005 Document Revised: 04/30/2015 Document Reviewed: 09/17/2011 Elsevier Interactive Patient Education [employer].

## 2015-07-24 NOTE — ED Notes (Signed)
Patient c/o lower back pain, increase in urinary frequency and bladder pressure since Tuesday.  Patient denies fevers.

## 2015-07-24 NOTE — ED Provider Notes (Signed)
[facility]  ____________________________________________  Time seen: Approximately 10:32 AM  I have reviewed the triage vital signs and the nursing notes.   HISTORY  Chief Complaint Dysuria   HPI VERALYNN SKUBIC is a 34 y.o. female presents for complaints of 2 days of dysuria. Reports urinary frequency and urgency as well as some suprapubic pressure discomfort described as 3 out of 10 pain. Also reports some burning during urination. Denies back pain, nausea, vomiting, diarrhea, fever. Reports continues to eat and drink well. Denies fall or trauma. Denies vaginal bleeding, vaginal discharge vaginal odor or other vaginal complaints. Denies recent change in sexual partners.States last uti 3-4 years ago. Denies recent sickness.   Denies other complaints.  Last menstrual 1 month ago. : Denies chance of pregnancy.    Past Medical History  Diagnosis Date  . Pre-diabetes   . PCOS (polycystic ovarian syndrome)   . Thyroid disease   Left foot walking boot following with podiatry due to pain x months, no wound and no fracture per patient.   There are no active problems to display for this patient.   Past Surgical History  Procedure Laterality Date  . Thyriod    . Ovarian cyst removal      PCP: [facility]en   Current Outpatient Rx  Name  Route  Sig  Dispense  Refill  .           .           .           .           .           . letrozole (FEMARA) 2.5 MG tablet   Oral   Take 2.5 mg by mouth daily.         Marland Kitchen liothyronine (CYTOMEL) 25 MCG tablet   Oral   Take by mouth daily.         . norethindrone (AYGESTIN) 5 MG tablet   Oral   Take by mouth daily.         .           . thyroid (ARMOUR) 60 MG tablet   Oral   Take 60 mg by mouth daily before breakfast.           Allergies Metformin and related  History reviewed. No pertinent family history.  Social History Social History  Substance Use Topics  . Smoking status: Never Smoker   . Smokeless  tobacco: Never Used  . Alcohol Use: No    Review of Systems Constitutional: No fever/chills Eyes: No visual changes. ENT: No sore throat. Cardiovascular: Denies chest pain. Respiratory: Denies shortness of breath. Gastrointestinal: No abdominal pain.  No nausea, no vomiting.  No diarrhea.  No constipation. Genitourinary: positive for dysuria. Musculoskeletal: Negative for back pain. Skin: Negative for rash. Neurological: Negative for headaches, focal weakness or numbness.  10-point ROS otherwise negative.  ____________________________________________   PHYSICAL EXAM:  VITAL SIGNS: ED Triage Vitals  Enc Vitals Group     BP 07/24/15 0943 156/89 mmHg     Pulse Rate 07/24/15 0943 100 Recheck 84     Resp 07/24/15 0943 16     Temp 07/24/15 0943 97.9 F (36.6 C)     Temp Source 07/24/15 0943 Tympanic     SpO2 07/24/15 0943 100 %     Weight 07/24/15 0943 210 lb (95.255 kg)     Height 07/24/15 0943 5\' 5"  (1.651 m)  Head Cir --      Peak Flow --      Pain Score 07/24/15 0947 4     Pain Loc --      Pain Edu? --      Excl. in [place]? --     Constitutional: Alert and oriented. Well appearing and in no acute distress. Eyes: Conjunctivae are normal. PERRL. EOMI. Head: Atraumatic.Marland Kitchen   Nose: No congestion/rhinnorhea.  Mouth/Throat: Mucous membranes are moist.  Oropharynx non-erythematous. Neck: No stridor.  No cervical spine tenderness to palpation. Hematological/Lymphatic/Immunilogical: No cervical lymphadenopathy. Cardiovascular: Normal rate, regular rhythm. Grossly normal heart sounds.  Good peripheral circulation. Respiratory: Normal respiratory effort.  No retractions. Lungs CTAB. No wheezes, rales or rhonchi. Good air movement. Gastrointestinal: Soft and nontender. No distention. Normal Bowel sounds.   No CVA tenderness. Musculoskeletal: No lower or upper extremity tenderness nor edema.  Bilateral pedal pulses equal and easily palpated. Left foot walking boot.  Neurologic:   Normal speech and language. No gross focal neurologic deficits are appreciated. No gait instability. Skin:  Skin is warm, dry and intact. No rash noted. Psychiatric: Mood and affect are normal. Speech and behavior are normal.  ____________________________________________   LABS (all labs ordered are listed, but only abnormal results are displayed)  Labs Reviewed  URINALYSIS COMPLETEWITH MICROSCOPIC ([place]) - Abnormal; Notable for the following:    Color, Urine STRAW (*)    Hgb urine dipstick TRACE (*)    Leukocytes, UA TRACE (*)    Bacteria, UA RARE (*)    Squamous Epithelial / LPF 0-5 (*)    All other components within normal limits  URINE CULTURE  PREGNANCY, URINE   _  INITIAL IMPRESSION / ASSESSMENT AND PLAN / ED COURSE  Pertinent labs & imaging results that were available during my care of the patient were reviewed by me and considered in my medical decision making (see chart for details).   Very well-appearing patient. No acute distress. Presents with complaints of 2 days of dysuria complaints. Urine pregnancy negative. Urinalysis positive for rare bacteria, trace hgb, trace leukocytes and straw urine culture. Will culture urine and treat urinary tract infection with 3 day course of Bactrim as well as when necessary pyridium. Discussed supportive treatments including rest, fluids, PCP follow up.  Discussed follow up with Primary care physician this week. Discussed follow up and return parameters including no resolution or any worsening concerns. Patient verbalized understanding and agreed to plan.   ____________________________________________   FINAL CLINICAL IMPRESSION(S) / ED DIAGNOSES  Final diagnoses:  UTI (lower urinary tract infection)       Marylene Land, NP 07/24/15 1145  Marylene Land, NP 07/24/15 1148

## 2015-07-26 LAB — URINE CULTURE
Culture: NO GROWTH
SPECIAL REQUESTS: NORMAL

## 2015-07-30 ENCOUNTER — Ambulatory Visit: Payer: Federal, State, Local not specified - PPO | Admitting: Podiatry

## 2015-08-01 ENCOUNTER — Encounter: Payer: Self-pay | Admitting: Podiatry

## 2015-08-01 ENCOUNTER — Ambulatory Visit (INDEPENDENT_AMBULATORY_CARE_PROVIDER_SITE_OTHER): Payer: Federal, State, Local not specified - PPO | Admitting: Podiatry

## 2015-08-01 VITALS — BP 131/97 | HR 103 | Resp 16

## 2015-08-01 DIAGNOSIS — M779 Enthesopathy, unspecified: Secondary | ICD-10-CM | POA: Diagnosis not present

## 2015-08-01 DIAGNOSIS — R600 Localized edema: Secondary | ICD-10-CM | POA: Diagnosis not present

## 2015-08-03 NOTE — Progress Notes (Signed)
Subjective:     Patient ID: Meghan Leach, female   DOB: [birth year], 34 y.o.   MRN: [id number]  HPI patient states my left foot seems some improved and I been able to go several days without wearing the boot   Review of Systems     Objective:   Physical Exam Neurovascular status intact muscle strength adequate with diminished discomfort on the dorsum of the left foot around the second and third metatarsals with pain still present with excessive activity    Assessment:     Inflammatory complex left improved but present    Plan:     Reviewed condition and at this point we are going to gradually increase her activity levels and I did dispensed ankle stocking to try to control edema that's present. Reappoint if symptoms persist and may require MRI if we get persistent pain swelling

## 2015-08-25 ENCOUNTER — Other Ambulatory Visit: Payer: Self-pay | Admitting: Podiatry

## 2015-08-26 NOTE — Telephone Encounter (Signed)
Pt will need an appt prior to future refills. 

## 2016-07-12 ENCOUNTER — Ambulatory Visit (INDEPENDENT_AMBULATORY_CARE_PROVIDER_SITE_OTHER): Payer: Federal, State, Local not specified - PPO | Admitting: Podiatry

## 2016-07-12 ENCOUNTER — Ambulatory Visit (INDEPENDENT_AMBULATORY_CARE_PROVIDER_SITE_OTHER): Payer: Federal, State, Local not specified - PPO

## 2016-07-12 DIAGNOSIS — M722 Plantar fascial fibromatosis: Secondary | ICD-10-CM

## 2016-07-12 DIAGNOSIS — M779 Enthesopathy, unspecified: Secondary | ICD-10-CM

## 2016-07-12 MED ORDER — TRIAMCINOLONE ACETONIDE 10 MG/ML IJ SUSP
10.0000 mg | Freq: Once | INTRAMUSCULAR | Status: AC
Start: 2016-07-12 — End: 2016-07-12
  Administered 2016-07-12: 10 mg

## 2016-07-12 MED ORDER — DICLOFENAC SODIUM 75 MG PO TBEC: 75.0000 mg | DELAYED_RELEASE_TABLET | Freq: Two times a day (BID) | ORAL | 2 refills | Status: DC

## 2016-07-12 NOTE — Progress Notes (Signed)
Subjective:     Patient ID: Meghan Leach, female   DOB: 1980-10-30, 35 y.o.   MRN: [id number]  HPI patient presents stating she's getting more problems right over left foot with inflammation that's occurred over the last year. The left foot that we worked on is doing pretty good but the right foot is becoming increasingly tender   Review of Systems     Objective:   Physical Exam Neurovascular status intact with quite a bit of discomfort around the anterior tibial tendon right as it comes across the ankle with no indication of muscle dysfunction when I tested. On the left there is mild discomfort and also pain in the plantar fascia of both feet    Assessment:     Appears to be acute anterior tibial tendinitis right with moderate plantar fascial symptomatology bilateral    Plan:     H&P condition reviewed and careful sheath injection administered after discussing chances for rupture associated with injection. I injected with 3 mg Dexon some Kenalog 5 mg Xylocaine and instructed on ice and utilization of brace to hold the arch. Patient will be seen back to recheck again in the next several weeks and also I discussed long-term orthotics to help with plantar fasciitis and reviewed exercises to do  X-ray report indicates mild depression of the arch with no indication of current bone injury

## 2016-07-26 ENCOUNTER — Ambulatory Visit (INDEPENDENT_AMBULATORY_CARE_PROVIDER_SITE_OTHER): Payer: Federal, State, Local not specified - PPO | Admitting: Podiatry

## 2016-07-26 DIAGNOSIS — M779 Enthesopathy, unspecified: Secondary | ICD-10-CM

## 2016-07-26 DIAGNOSIS — T148XXA Other injury of unspecified body region, initial encounter: Secondary | ICD-10-CM

## 2016-07-26 DIAGNOSIS — M7750 Other enthesopathy of unspecified foot: Secondary | ICD-10-CM | POA: Diagnosis not present

## 2016-07-29 NOTE — Progress Notes (Signed)
Subjective:     Patient ID: Meghan Leach, female   DOB: 10/21/1980, 35 y.o.   MRN: [id number]  HPI patient states she continues to get discomfort and she had relief for a short period of time followed by reoccurrence   Review of Systems     Objective:   Physical Exam Neurovascular status intact with continued discomfort in the right foot that's localized with no indication currently of tendon dysfunction but the possibility exists that there may be trauma to the tendon itself within the tendon    Assessment:     Possibility for interstitial tear of the tendon right foot versus an inflammatory condition that's localized in nature with no indication of tendon dysfunction at this time    Plan:     Advised on aggressive ice therapy and placed into a cam walker to immobilize and discussed possibility for MRI if symptoms persist. Reappoint after immobilization of 3 weeks to reevaluate

## 2016-08-18 ENCOUNTER — Ambulatory Visit: Payer: Federal, State, Local not specified - PPO | Admitting: Podiatry

## 2016-08-19 ENCOUNTER — Ambulatory Visit (INDEPENDENT_AMBULATORY_CARE_PROVIDER_SITE_OTHER): Payer: Federal, State, Local not specified - PPO | Admitting: Podiatry

## 2016-08-19 ENCOUNTER — Encounter: Payer: Self-pay | Admitting: Podiatry

## 2016-08-19 DIAGNOSIS — R600 Localized edema: Secondary | ICD-10-CM

## 2016-08-19 DIAGNOSIS — M779 Enthesopathy, unspecified: Secondary | ICD-10-CM | POA: Diagnosis not present

## 2016-08-19 DIAGNOSIS — T148XXA Other injury of unspecified body region, initial encounter: Secondary | ICD-10-CM

## 2016-08-19 NOTE — Progress Notes (Signed)
Subjective:     Patient ID: Meghan Leach, female   DOB: 1981-02-03, 35 y.o.   MRN: [id number]  HPI patient states that she still getting some pain in the right ankle but it seems to have eased up some and the boot definitely helped and the patient will present is more tolerable   Review of Systems     Objective:   Physical Exam Neurovascular status intact with mild edema around the anterior tibial tendon as it comes across the ankle with good function of the tendon noted upon testing today. There is still discomfort when I palpated but it has lessened    Assessment:     Probability for continued tendon irritation with no current indication of complete tear but still cannot rule out interstitial tear    Plan:     Since improvement is occurring we will continue with conservative care consisting of immobilization ice therapy and gradual range of motion activity. She will tested more fully prior to see me back next visit and at that should intensify work and the need to get MRI of this or if any issues prior to that occur or we will need to get MRI. Continue diclofenac and reappoint in 4 weeks

## 2016-09-20 ENCOUNTER — Ambulatory Visit: Payer: Federal, State, Local not specified - PPO | Admitting: Podiatry

## 2016-09-24 ENCOUNTER — Ambulatory Visit
Admission: EM | Admit: 2016-09-24 | Discharge: 2016-09-24 | Disposition: A | Discharge status: Home / Self Care | Payer: Federal, State, Local not specified - PPO | Attending: Internal Medicine | Admitting: Internal Medicine

## 2016-09-24 ENCOUNTER — Encounter: Payer: Self-pay | Admitting: Emergency Medicine

## 2016-09-24 DIAGNOSIS — R69 Illness, unspecified: Secondary | ICD-10-CM | POA: Diagnosis not present

## 2016-09-24 DIAGNOSIS — J111 Influenza due to unidentified influenza virus with other respiratory manifestations: Secondary | ICD-10-CM

## 2016-09-24 MED ORDER — BENZONATATE 200 MG PO CAPS: 200.0000 mg | ORAL_CAPSULE | Freq: Three times a day (TID) | ORAL | 1 refills | Status: DC | PRN

## 2016-09-24 MED ORDER — PREDNISONE 50 MG PO TABS: 50.0000 mg | ORAL_TABLET | Freq: Every day | ORAL | 0 refills | Status: DC

## 2016-09-24 MED ORDER — OSELTAMIVIR PHOSPHATE 75 MG PO CAPS: 75.0000 mg | ORAL_CAPSULE | Freq: Two times a day (BID) | ORAL | 0 refills | Status: DC

## 2016-09-24 NOTE — ED Triage Notes (Signed)
Patient c/o cough, sore throat, congestion, and bodyaches that started yesterday.  Patient denies fevers.

## 2016-09-24 NOTE — Discharge Instructions (Addendum)
Recheck if sustained fever >100.5, increasing phlegm production/nasal discharge, or if not starting to see some improvement in several days.

## 2016-09-24 NOTE — ED Provider Notes (Signed)
[facility]    CSN: [id number] Arrival date & time: 09/24/16  1307     History   Chief Complaint Chief Complaint  Patient presents with  . Cough    HPI Meghan Leach is a 36 y.o. female.  She presents today with onset yesterday of achiness, cough, sore throat, chest tightness. Malaise. Little bit of runny nose.  No nausea/vomiting, no diarrhea.  HPI  Past Medical History:  Diagnosis Date  . PCOS (polycystic ovarian syndrome)   . Pre-diabetes   . Thyroid disease      Past Surgical History:  Procedure Laterality Date  . OVARIAN CYST REMOVAL    . thyriod        Home Medications    Prior to Admission medications   Medication Sig Start Date End Date Taking? Authorizing Provider  benzonatate (TESSALON) 200 MG capsule Take 1 capsule (200 mg total) by mouth 3 (three) times daily as needed for cough. 09/24/16   Sherlene Shams, MD  DiphenhydrAMINE HCl (BENADRYL ALLERGY PO) Take by mouth.    Historical Provider, MD  ibuprofen (ADVIL,MOTRIN) 800 MG tablet Take 1 tablet (800 mg total) by mouth every 8 (eight) hours as needed for mild pain or moderate pain. 05/18/15   Marylene Land, NP  liothyronine (CYTOMEL) 25 MCG tablet Take by mouth daily.    Historical Provider, MD  NALTREXONE HCL PO Take 4.5 mg by mouth daily.     Historical Provider, MD  oseltamivir (TAMIFLU) 75 MG capsule Take 1 capsule (75 mg total) by mouth every 12 (twelve) hours. 09/24/16   Sherlene Shams, MD  predniSONE (DELTASONE) 50 MG tablet Take 1 tablet (50 mg total) by mouth daily. 09/24/16   Sherlene Shams, MD  thyroid (ARMOUR) 60 MG tablet Take 60 mg by mouth 2 (two) times daily.     Historical Provider, MD    Family History History reviewed. No pertinent family history.  Social History Social History  Substance Use Topics  . Smoking status: Never Smoker  . Smokeless tobacco: Never Used  . Alcohol use No     Allergies   Metformin and related   Review of Systems Review of Systems  All  other systems reviewed and are negative.    Physical Exam Triage Vital Signs ED Triage Vitals  Enc Vitals Group     BP 09/24/16 1407 (S) (!) 140/112     Pulse Rate 09/24/16 1407 (!) 114     Resp 09/24/16 1407 17     Temp 09/24/16 1407 99.1 F (37.3 C)     Temp Source 09/24/16 1407 Oral     SpO2 09/24/16 1407 100 %     Weight 09/24/16 1404 210 lb (95.3 kg)     Height 09/24/16 1404 5\' 5"  (1.651 m)     Pain Score 09/24/16 1408 7     Pain Loc --    Updated Vital Signs BP (S) (!) 140/112 (BP Location: Left Arm)   Pulse (!) 114   Temp 99.1 F (37.3 C) (Oral)   Resp 17   Ht 5\' 5"  (1.651 m)   Wt 210 lb (95.3 kg)   LMP 08/31/2016 (Exact Date)   SpO2 100%   BMI 34.95 kg/m   Physical Exam  Constitutional: She is oriented to person, place, and time. No distress.  Alert, nicely groomed  HENT:  Head: Atraumatic.  Bilateral TMs are dull, no erythema Moderate nasal congestion bilaterally Throat is a little bit red  Eyes:  Conjugate gaze, no eye drainage. Both eyes are the tiniest bit injected.  Neck: Neck supple.  Cardiovascular: Regular rhythm.   Heart rate 110s  Pulmonary/Chest: No respiratory distress.  Lungs clear, symmetric breath sounds  Abdominal: She exhibits no distension.  Musculoskeletal: Normal range of motion.  No leg swelling  Neurological: She is alert and oriented to person, place, and time.  Skin: Skin is warm and dry.  No cyanosis  Nursing note and vitals reviewed.    UC Treatments / Results   Procedures Procedures (including critical care time) None today  Final Clinical Impressions(s) / UC Diagnoses   Final diagnoses:  Influenza-like illness   Recheck if sustained fever >100.5, increasing phlegm production/nasal discharge, or if not starting to see some improvement in several days.    New Prescriptions New Prescriptions   BENZONATATE (TESSALON) 200 MG CAPSULE    Take 1 capsule (200 mg total) by mouth 3 (three) times daily as needed for  cough.   OSELTAMIVIR (TAMIFLU) 75 MG CAPSULE    Take 1 capsule (75 mg total) by mouth every 12 (twelve) hours.   PREDNISONE (DELTASONE) 50 MG TABLET    Take 1 tablet (50 mg total) by mouth daily.     Sherlene Shams, MD 09/25/16 2118

## 2016-09-29 ENCOUNTER — Ambulatory Visit
Admission: EM | Admit: 2016-09-29 | Discharge: 2016-09-29 | Disposition: A | Discharge status: Home / Self Care | Payer: Federal, State, Local not specified - PPO | Attending: Family Medicine | Admitting: Family Medicine

## 2016-09-29 ENCOUNTER — Ambulatory Visit (INDEPENDENT_AMBULATORY_CARE_PROVIDER_SITE_OTHER): Payer: Federal, State, Local not specified - PPO

## 2016-09-29 ENCOUNTER — Encounter: Payer: Self-pay | Admitting: Emergency Medicine

## 2016-09-29 DIAGNOSIS — R059 Cough, unspecified: Secondary | ICD-10-CM

## 2016-09-29 DIAGNOSIS — R69 Illness, unspecified: Secondary | ICD-10-CM | POA: Diagnosis not present

## 2016-09-29 DIAGNOSIS — R05 Cough: Secondary | ICD-10-CM | POA: Diagnosis not present

## 2016-09-29 DIAGNOSIS — J111 Influenza due to unidentified influenza virus with other respiratory manifestations: Secondary | ICD-10-CM

## 2016-09-29 NOTE — ED Triage Notes (Signed)
Patient states that she still has a dry cough and chest congestion.  Patient states that she was seen on Friday and has not improved. Patient denies fevers.

## 2016-09-29 NOTE — ED Provider Notes (Signed)
[facility]    CSN: [id number] Arrival date & time: 09/29/16  1200     History   Chief Complaint Chief Complaint  Patient presents with  . Cough    HPI Meghan Leach is a 36 y.o. female.   The history is provided by the patient.  URI  Presenting symptoms: congestion, cough and fatigue   Severity:  Moderate Onset quality:  Sudden Duration:  10 days Timing:  Constant Progression:  Unchanged Chronicity:  New Relieved by:  Prescription medications Associated symptoms: no headaches, no sinus pain and no wheezing   Risk factors: recent illness (flu last week)   Risk factors: not elderly, no chronic cardiac disease, no chronic kidney disease, no chronic respiratory disease, no diabetes mellitus, no immunosuppression, no recent travel and no sick contacts     Past Medical History:  Diagnosis Date  . PCOS (polycystic ovarian syndrome)   . Pre-diabetes   . Thyroid disease     There are no active problems to display for this patient.   Past Surgical History:  Procedure Laterality Date  . OVARIAN CYST REMOVAL    . thyriod      OB History    No data available       Home Medications    Prior to Admission medications   Medication Sig Start Date End Date Taking? Authorizing Provider  benzonatate (TESSALON) 200 MG capsule Take 1 capsule (200 mg total) by mouth 3 (three) times daily as needed for cough. 09/24/16   Sherlene Shams, MD  DiphenhydrAMINE HCl (BENADRYL ALLERGY PO) Take by mouth.    Historical Provider, MD  ibuprofen (ADVIL,MOTRIN) 800 MG tablet Take 1 tablet (800 mg total) by mouth every 8 (eight) hours as needed for mild pain or moderate pain. 05/18/15   Marylene Land, NP  liothyronine (CYTOMEL) 25 MCG tablet Take by mouth daily.    Historical Provider, MD  NALTREXONE HCL PO Take 4.5 mg by mouth daily.     Historical Provider, MD  thyroid (ARMOUR) 60 MG tablet Take 60 mg by mouth 2 (two) times daily.     Historical Provider, MD    Family  History History reviewed. No pertinent family history.  Social History Social History  Substance Use Topics  . Smoking status: Never Smoker  . Smokeless tobacco: Never Used  . Alcohol use No     Allergies   Metformin and related   Review of Systems Review of Systems  Constitutional: Positive for fatigue.  HENT: Positive for congestion. Negative for sinus pain.   Respiratory: Positive for cough. Negative for wheezing.   Neurological: Negative for headaches.     Physical Exam Triage Vital Signs ED Triage Vitals  Enc Vitals Group     BP 09/29/16 1301 (!) 155/90     Pulse Rate 09/29/16 1301 93     Resp 09/29/16 1301 16     Temp 09/29/16 1301 98.1 F (36.7 C)     Temp Source 09/29/16 1301 Oral     SpO2 09/29/16 1301 100 %     Weight 09/29/16 1259 210 lb (95.3 kg)     Height 09/29/16 1259 5\' 5"  (1.651 m)     Head Circumference --      Peak Flow --      Pain Score 09/29/16 1300 0     Pain Loc --      Pain Edu? --      Excl. in [place]? --    No data found.  Updated Vital Signs BP (!) 155/90 (BP Location: Left Arm)   Pulse 93   Temp 98.1 F (36.7 C) (Oral)   Resp 16   Ht 5\' 5"  (1.651 m)   Wt 210 lb (95.3 kg)   LMP 09/27/2016 (Exact Date) Comment: lmp now  SpO2 100%   BMI 34.95 kg/m   Visual Acuity Right Eye Distance:   Left Eye Distance:   Bilateral Distance:    Right Eye Near:   Left Eye Near:    Bilateral Near:     Physical Exam  Constitutional: She appears well-developed and well-nourished. No distress.  HENT:  Head: Normocephalic and atraumatic.  Right Ear: Tympanic membrane, external ear and ear canal normal.  Left Ear: Tympanic membrane, external ear and ear canal normal.  Nose: No mucosal edema, rhinorrhea, nose lacerations, sinus tenderness, nasal deformity, septal deviation or nasal septal hematoma. No epistaxis.  No foreign bodies. Right sinus exhibits no maxillary sinus tenderness and no frontal sinus tenderness. Left sinus exhibits no  maxillary sinus tenderness and no frontal sinus tenderness.  Mouth/Throat: Uvula is midline, oropharynx is clear and moist and mucous membranes are normal. No oropharyngeal exudate.  Eyes: Conjunctivae and EOM are normal. Pupils are equal, round, and reactive to light. Right eye exhibits no discharge. Left eye exhibits no discharge. No scleral icterus.  Neck: Normal range of motion. Neck supple. No thyromegaly present.  Cardiovascular: Normal rate, regular rhythm and normal heart sounds.   Pulmonary/Chest: Effort normal and breath sounds normal. No respiratory distress. She has no wheezes. She has no rales.  Lymphadenopathy:    She has no cervical adenopathy.  Skin: She is not diaphoretic.  Nursing note and vitals reviewed.    UC Treatments / Results  Labs (all labs ordered are listed, but only abnormal results are displayed) Labs Reviewed - No data to display  EKG  EKG Interpretation None       Radiology Dg Chest 2 View  Result Date: 09/29/2016 CLINICAL DATA:  Nonproductive cough. EXAM: CHEST  2 VIEW COMPARISON:  Radiographs of October 09, 2013. FINDINGS: The heart size and mediastinal contours are within normal limits. Both lungs are clear. No pneumothorax or pleural effusion is noted. The visualized skeletal structures are unremarkable. IMPRESSION: No active cardiopulmonary disease. Electronically Signed   By: Marijo Conception, M.D.   On: 09/29/2016 15:23    Procedures Procedures (including critical care time)  Medications Ordered in UC Medications - No data to display   Initial Impression / Assessment and Plan / UC Course  I have reviewed the triage vital signs and the nursing notes.  Pertinent labs & imaging results that were available during my care of the patient were reviewed by me and considered in my medical decision making (see chart for details).       Final Clinical Impressions(s) / UC Diagnoses   Final diagnoses:  Cough  Influenza-like illness    New  Prescriptions Discharge Medication List as of 09/29/2016  3:34 PM     1. x-ray results and diagnosis reviewed with patient 2. Recommend supportive treatment with rest, fluids, cough medication 3. Follow-up prn if symptoms worsen or don't improve   Norval Gable, MD 09/29/16 1538

## 2016-09-29 NOTE — Discharge Instructions (Signed)
Continue current cough medication

## 2017-04-05 ENCOUNTER — Ambulatory Visit
Admission: EM | Admit: 2017-04-05 | Discharge: 2017-04-05 | Disposition: A | Discharge status: Home / Self Care | Payer: Federal, State, Local not specified - PPO | Attending: Family Medicine | Admitting: Family Medicine

## 2017-04-05 DIAGNOSIS — R350 Frequency of micturition: Secondary | ICD-10-CM | POA: Diagnosis not present

## 2017-04-05 DIAGNOSIS — R300 Dysuria: Secondary | ICD-10-CM

## 2017-04-05 LAB — URINALYSIS, COMPLETE (UACMP) WITH MICROSCOPIC
BILIRUBIN URINE: NEGATIVE
Bacteria, UA: NONE SEEN
Glucose, UA: NEGATIVE mg/dL
HGB URINE DIPSTICK: NEGATIVE
Ketones, ur: NEGATIVE mg/dL
Leukocytes, UA: NEGATIVE
NITRITE: NEGATIVE
PROTEIN: NEGATIVE mg/dL
RBC / HPF: NONE SEEN RBC/hpf (ref 0–5)
Specific Gravity, Urine: 1.015 (ref 1.005–1.030)
WBC UA: NONE SEEN WBC/hpf (ref 0–5)
pH: 7 (ref 5.0–8.0)

## 2017-04-05 MED ORDER — TRIAMCINOLONE ACETONIDE 0.1 % EX CREA: 1.0000 "application " | TOPICAL_CREAM | Freq: Two times a day (BID) | CUTANEOUS | 0 refills | Status: DC

## 2017-04-05 MED ORDER — CEPHALEXIN 500 MG PO CAPS: 500.0000 mg | ORAL_CAPSULE | Freq: Two times a day (BID) | ORAL | 0 refills | Status: DC

## 2017-04-05 NOTE — Discharge Instructions (Signed)
Use triamcinolone twice daily to the labia. Do Not use more than 7 days. If you are not improving return to our clinic for further evaluation. Urine culture results will be available in 48 hours.

## 2017-04-05 NOTE — ED Provider Notes (Signed)
[facility]    CSN: [id number] Arrival date & time: 04/05/17  1713     History   Chief Complaint Chief Complaint  Patient presents with  . Dysuria    HPI Meghan Leach is a 36 y.o. female.   HPI   36 year old female who presents with frequency and urgency pressure with urination. Symptoms started this morning. She performed a home test for UTI and a dipstick was positive for leukocyte Estrace. She has recently returned from [facility]. He sates that she do would wear her wet bathing suit for longer periods of time. He also used suntan lotion and may have actually gotten some  on the genitalia. He denies any vaginal discharge or odor. She states that she is near time to start her period. He usually will have pelvic pain prior to starting the period.       Past Medical History:  Diagnosis Date  . PCOS (polycystic ovarian syndrome)   . Pre-diabetes   . Thyroid disease     There are no active problems to display for this patient.   Past Surgical History:  Procedure Laterality Date  . OVARIAN CYST REMOVAL    . thyriod      OB History    No data available       Home Medications    Prior to Admission medications   Medication Sig Start Date End Date Taking? Authorizing Provider  benzonatate (TESSALON) 200 MG capsule Take 1 capsule (200 mg total) by mouth 3 (three) times daily as needed for cough. 09/24/16   Sherlene Shams, MD  cephALEXin (KEFLEX) 500 MG capsule Take 1 capsule (500 mg total) by mouth 2 (two) times daily. 04/05/17   Lorin Picket, PA-C  DiphenhydrAMINE HCl (BENADRYL ALLERGY PO) Take by mouth.    [provider]  ibuprofen (ADVIL,MOTRIN) 800 MG tablet Take 1 tablet (800 mg total) by mouth every 8 (eight) hours as needed for mild pain or moderate pain. 05/18/15   Marylene Land, NP  liothyronine (CYTOMEL) 25 MCG tablet Take by mouth daily.    [provider]  NALTREXONE HCL PO Take 4.5 mg by mouth daily.     [provider]  thyroid (ARMOUR) 60 MG tablet Take 60 mg by mouth 2 (two) times daily.     [provider]  triamcinolone cream (KENALOG) 0.1 % Apply 1 application topically 2 (two) times daily. 04/05/17   Lorin Picket, PA-C    Family History No family history on file.  Social History Social History  Substance Use Topics  . Smoking status: Never Smoker  . Smokeless tobacco: Never Used  . Alcohol use No     Allergies   Metformin and related   Review of Systems Review of Systems  Constitutional: Negative for activity change, appetite change, fatigue and fever.  Genitourinary: Positive for dysuria, frequency and urgency. Negative for vaginal discharge and vaginal pain.  All other systems reviewed and are negative.    Physical Exam Triage Vital Signs ED Triage Vitals  Enc Vitals Group     BP 04/05/17 1727 (!) 146/87     Pulse Rate 04/05/17 1727 75     Resp 04/05/17 1727 18     Temp 04/05/17 1727 97.8 F (36.6 C)     Temp Source 04/05/17 1727 Oral     SpO2 04/05/17 1727 100 %     Weight 04/05/17 1725 220 lb (99.8 kg)     Height 04/05/17 1725  5\' 5"  (1.651 m)     Head Circumference --      Peak Flow --      Pain Score --      Pain Loc --      Pain Edu? --      Excl. in [place]? --    No data found.   Updated Vital Signs BP (!) 146/87 (BP Location: Left Arm)   Pulse 75   Temp 97.8 F (36.6 C) (Oral)   Resp 18   Ht 5\' 5"  (1.651 m)   Wt 220 lb (99.8 kg)   LMP 04/03/2017   SpO2 100%   BMI 36.61 kg/m   Visual Acuity Right Eye Distance:   Left Eye Distance:   Bilateral Distance:    Right Eye Near:   Left Eye Near:    Bilateral Near:     Physical Exam  Constitutional: She is oriented to person, place, and time. She appears well-developed and well-nourished. No distress.  HENT:  Head: Normocephalic.  Eyes: Pupils are equal, round, and reactive to light.  Neck: Normal range of motion.  Musculoskeletal: Normal range of motion.  Neurological: She  is alert and oriented to person, place, and time.  Skin: Skin is warm and dry. She is not diaphoretic.  Psychiatric: She has a normal mood and affect. Her behavior is normal. Judgment and thought content normal.  Nursing note and vitals reviewed.    UC Treatments / Results  Labs (all labs ordered are listed, but only abnormal results are displayed) Labs Reviewed  URINALYSIS, COMPLETE (UACMP) WITH MICROSCOPIC - Abnormal; Notable for the following:       Result Value   Color, Urine STRAW (*)    Squamous Epithelial / LPF 0-5 (*)    All other components within normal limits  URINE CULTURE    EKG  EKG Interpretation None       Radiology No results found.  Procedures Procedures (including critical care time)  Medications Ordered in UC Medications - No data to display   Initial Impression / Assessment and Plan / UC Course  I have reviewed the triage vital signs and the nursing notes.  Pertinent labs & imaging results that were available during my care of the patient were reviewed by me and considered in my medical decision making (see chart for details). Plan: 1. Test/x-ray results and diagnosis reviewed with patient 2. rx as per orders; risks, benefits, potential side effects reviewed with patient 3. Recommend supportive treatment with the area clean and dry. Apply triamcinolone cream to the labia twice daily. Urine cultures should be available in 48 hours. Because of her symptoms we will also treat her with Keflex empirically for UTI pending results of the urine. Told her that it's likely that she does not have a urinary tract infection and since started irritated from the lotion that she applied for suntan. May be from the wet bathing suits. If she is not improving she should follow-up in our clinic for further evaluation. 4. F/u prn if symptoms worsen or don't improve       Final Clinical Impressions(s) / UC Diagnoses   Final diagnoses:  Dysuria    New  Prescriptions Discharge Medication List as of 04/05/2017  6:21 PM    START taking these medications   Details  cephALEXin (KEFLEX) 500 MG capsule Take 1 capsule (500 mg total) by mouth 2 (two) times daily., Starting Tue 04/05/2017, Normal    triamcinolone cream (KENALOG) 0.1 % Apply 1 application  topically 2 (two) times daily., Starting Tue 04/05/2017, Normal         Controlled Substance Prescriptions [place] Controlled Substance Registry consulted? Not Applicable   Lorin Picket, PA-C 04/05/17 1831

## 2017-04-05 NOTE — ED Triage Notes (Signed)
Pt with frequency and urgency and some pressure with urination. Sx started this morning.

## 2017-04-07 LAB — URINE CULTURE: Culture: <10000 — AB

## 2017-08-05 ENCOUNTER — Inpatient Hospital Stay (HOSPITAL_COMMUNITY)
Admission: AD | Admit: 2017-08-05 | Discharge: 2017-08-05 | Disposition: A | Discharge status: Home / Self Care | Payer: Federal, State, Local not specified - PPO | Source: Ambulatory Visit | Attending: Family Medicine | Admitting: Family Medicine

## 2017-08-05 ENCOUNTER — Inpatient Hospital Stay (HOSPITAL_COMMUNITY): Payer: Federal, State, Local not specified - PPO

## 2017-08-05 ENCOUNTER — Encounter (HOSPITAL_COMMUNITY): Payer: Self-pay | Admitting: *Deleted

## 2017-08-05 DIAGNOSIS — O26851 Spotting complicating pregnancy, first trimester: Secondary | ICD-10-CM | POA: Diagnosis not present

## 2017-08-05 DIAGNOSIS — O3411 Maternal care for benign tumor of corpus uteri, first trimester: Secondary | ICD-10-CM | POA: Insufficient documentation

## 2017-08-05 DIAGNOSIS — N83201 Unspecified ovarian cyst, right side: Secondary | ICD-10-CM | POA: Diagnosis not present

## 2017-08-05 DIAGNOSIS — D259 Leiomyoma of uterus, unspecified: Secondary | ICD-10-CM

## 2017-08-05 DIAGNOSIS — Z3A01 Less than 8 weeks gestation of pregnancy: Secondary | ICD-10-CM | POA: Insufficient documentation

## 2017-08-05 DIAGNOSIS — D252 Subserosal leiomyoma of uterus: Secondary | ICD-10-CM | POA: Insufficient documentation

## 2017-08-05 DIAGNOSIS — N939 Abnormal uterine and vaginal bleeding, unspecified: Secondary | ICD-10-CM | POA: Diagnosis present

## 2017-08-05 DIAGNOSIS — O3481 Maternal care for other abnormalities of pelvic organs, first trimester: Secondary | ICD-10-CM | POA: Insufficient documentation

## 2017-08-05 DIAGNOSIS — R109 Unspecified abdominal pain: Secondary | ICD-10-CM | POA: Diagnosis not present

## 2017-08-05 DIAGNOSIS — Z3491 Encounter for supervision of normal pregnancy, unspecified, first trimester: Secondary | ICD-10-CM

## 2017-08-05 DIAGNOSIS — N83202 Unspecified ovarian cyst, left side: Secondary | ICD-10-CM | POA: Diagnosis not present

## 2017-08-05 DIAGNOSIS — O26899 Other specified pregnancy related conditions, unspecified trimester: Secondary | ICD-10-CM

## 2017-08-05 DIAGNOSIS — O26891 Other specified pregnancy related conditions, first trimester: Secondary | ICD-10-CM

## 2017-08-05 HISTORY — DX: Endometriosis, unspecified: N80.9

## 2017-08-05 HISTORY — DX: Unspecified ovarian cyst, unspecified side: N83.209

## 2017-08-05 HISTORY — DX: Leiomyoma of uterus, unspecified: D25.9

## 2017-08-05 LAB — CBC
HCT: 43.5 % (ref 36.0–46.0)
Hemoglobin: 14.4 g/dL (ref 12.0–15.0)
MCH: 27.6 pg (ref 26.0–34.0)
MCHC: 33.1 g/dL (ref 30.0–36.0)
MCV: 83.3 fL (ref 78.0–100.0)
Platelets: 248 10*3/uL (ref 150–400)
RBC: 5.22 MIL/uL — ABNORMAL HIGH (ref 3.87–5.11)
RDW: 13.6 % (ref 11.5–15.5)
WBC: 11 10*3/uL — ABNORMAL HIGH (ref 4.0–10.5)

## 2017-08-05 LAB — URINALYSIS, ROUTINE W REFLEX MICROSCOPIC
Bilirubin Urine: NEGATIVE
GLUCOSE, UA: NEGATIVE mg/dL
HGB URINE DIPSTICK: NEGATIVE
Ketones, ur: 5 mg/dL — AB
LEUKOCYTES UA: NEGATIVE
Nitrite: NEGATIVE
PH: 6 (ref 5.0–8.0)
PROTEIN: NEGATIVE mg/dL
Specific Gravity, Urine: 1.004 — ABNORMAL LOW (ref 1.005–1.030)

## 2017-08-05 LAB — WET PREP, GENITAL
Clue Cells Wet Prep HPF POC: NONE SEEN
Sperm: NONE SEEN
Trich, Wet Prep: NONE SEEN
Yeast Wet Prep HPF POC: NONE SEEN

## 2017-08-05 LAB — ABO/RH: ABO/RH(D): A POS

## 2017-08-05 LAB — POCT PREGNANCY, URINE: Preg Test, Ur: POSITIVE — AB

## 2017-08-05 LAB — HCG, QUANTITATIVE, PREGNANCY: HCG, BETA CHAIN, QUANT, S: 7796 m[IU]/mL — AB (ref <5)

## 2017-08-05 NOTE — MAU Provider Note (Signed)
History     CSN: [id number]  Arrival date and time: 08/05/17 1510   Chief Complaint  Patient presents with  . Possible Pregnancy  . Vaginal Bleeding   HPI Meghan Leach is a 36 y.o. G1P0 at [redacted]w[redacted]d who presents with vaginal spotting and lower abdominal pain. She states she noticed the bleeding when she wiped today and it is light pink. She also reports lower abdominal cramping x2 days that she rates a 5/10 and has not tried anything for the pain. She reports feeling frequency with urination and feeling like she cannot completely empty her bladder. She reports a hx of fibroids, cysts and endometriosis. She plans to [facility].  OB History    Gravida Para Term Preterm AB Living   1             SAB TAB Ectopic Multiple Live Births                  Past Medical History:  Diagnosis Date  . Endometriosis   . Fibroid, uterine   . Ovarian cyst   . PCOS (polycystic ovarian syndrome)   . Pre-diabetes   . Thyroid disease     Past Surgical History:  Procedure Laterality Date  . OVARIAN CYST REMOVAL    . thyriod    . THYROIDECTOMY    . TONSILLECTOMY      No family history on file.  Social History   Tobacco Use  . Smoking status: Never Smoker  . Smokeless tobacco: Never Used  Substance Use Topics  . Alcohol use: No  . Drug use: No    Allergies:  Allergies  Allergen Reactions  . Metformin And Related     sick    Medications Prior to Admission  Medication Sig Dispense Refill Last Dose  . benzonatate (TESSALON) 200 MG capsule Take 1 capsule (200 mg total) by mouth 3 (three) times daily as needed for cough. 30 capsule 1   . cephALEXin (KEFLEX) 500 MG capsule Take 1 capsule (500 mg total) by mouth 2 (two) times daily. 6 capsule 0   . DiphenhydrAMINE HCl (BENADRYL ALLERGY PO) Take by mouth.   Taking  . ibuprofen (ADVIL,MOTRIN) 800 MG tablet Take 1 tablet (800 mg total) by mouth every 8 (eight) hours as needed for mild pain or moderate  pain. 15 tablet 0 Taking  . liothyronine (CYTOMEL) 25 MCG tablet Take by mouth daily.   Taking  . NALTREXONE HCL PO Take 4.5 mg by mouth daily.      Marland Kitchen thyroid (ARMOUR) 60 MG tablet Take 60 mg by mouth 2 (two) times daily.    Taking  . triamcinolone cream (KENALOG) 0.1 % Apply 1 application topically 2 (two) times daily. 30 g 0     Review of Systems  Constitutional: Negative.  Negative for fatigue and fever.  HENT: Negative.   Respiratory: Negative.  Negative for shortness of breath.   Cardiovascular: Negative.  Negative for chest pain.  Gastrointestinal: Positive for abdominal pain. Negative for constipation, diarrhea, nausea and vomiting.  Genitourinary: Positive for frequency and vaginal bleeding. Negative for dysuria.  Neurological: Negative.  Negative for dizziness and headaches.   Physical Exam   Blood pressure (!) 156/86, pulse (!) 118, temperature 98.6 F (37 C), temperature source Oral, resp. rate 18, height 5\' 5"  (1.651 m), weight 225 lb (102.1 kg), last menstrual period 06/23/2017, SpO2 100 %.  Physical Exam  Nursing note and vitals reviewed. Constitutional: She  is oriented to person, place, and time. She appears well-developed and well-nourished. No distress.  HENT:  Head: Normocephalic.  Eyes: Pupils are equal, round, and reactive to light.  Cardiovascular: Normal rate, regular rhythm and normal heart sounds.  Respiratory: Effort normal and breath sounds normal. No respiratory distress.  GI: Soft. Bowel sounds are normal. She exhibits no distension. There is no tenderness.  Genitourinary: Vaginal discharge found.  Neurological: She is alert and oriented to person, place, and time.  Skin: Skin is warm and dry.  Psychiatric: She has a normal mood and affect. Her behavior is normal. Judgment and thought content normal.   Pelvic exam: Cervix pink, visually closed, without lesion, scant white creamy discharge, vaginal walls and external genitalia normal Bimanual exam:  Cervix 0/long/high, firm, anterior, neg CMT, uterus nontender, nonenlarged, adnexa without tenderness, enlargement, or mass  MAU Course  Procedures Results for orders placed or performed during the hospital encounter of 08/05/17 (from the past 24 hour(s))  Urinalysis, Routine w reflex microscopic     Status: Abnormal   Collection Time: 08/05/17  3:50 PM  Result Value Ref Range   Color, Urine YELLOW YELLOW   APPearance CLEAR CLEAR   Specific Gravity, Urine 1.004 (L) 1.005 - 1.030   pH 6.0 5.0 - 8.0   Glucose, UA NEGATIVE NEGATIVE mg/dL   Hgb urine dipstick NEGATIVE NEGATIVE   Bilirubin Urine NEGATIVE NEGATIVE   Ketones, ur 5 (A) NEGATIVE mg/dL   Protein, ur NEGATIVE NEGATIVE mg/dL   Nitrite NEGATIVE NEGATIVE   Leukocytes, UA NEGATIVE NEGATIVE  Pregnancy, urine POC     Status: Abnormal   Collection Time: 08/05/17  4:10 PM  Result Value Ref Range   Preg Test, Ur POSITIVE (A) NEGATIVE  CBC     Status: Abnormal   Collection Time: 08/05/17  4:43 PM  Result Value Ref Range   WBC 11.0 (H) 4.0 - 10.5 K/uL   RBC 5.22 (H) 3.87 - 5.11 MIL/uL   Hemoglobin 14.4 12.0 - 15.0 g/dL   HCT 43.5 36.0 - 46.0 %   MCV 83.3 78.0 - 100.0 fL   MCH 27.6 26.0 - 34.0 pg   MCHC 33.1 30.0 - 36.0 g/dL   RDW 13.6 11.5 - 15.5 %   Platelets 248 150 - 400 K/uL  hCG, quantitative, pregnancy     Status: Abnormal   Collection Time: 08/05/17  4:43 PM  Result Value Ref Range   hCG, Beta Chain, Quant, S 7,796 (H) <5 mIU/mL  ABO/Rh     Status: None (Preliminary result)   Collection Time: 08/05/17  4:43 PM  Result Value Ref Range   ABO/RH(D) A POS   Wet prep, genital     Status: Abnormal   Collection Time: 08/05/17  5:50 PM  Result Value Ref Range   Yeast Wet Prep HPF POC NONE SEEN NONE SEEN   Trich, Wet Prep NONE SEEN NONE SEEN   Clue Cells Wet Prep HPF POC NONE SEEN NONE SEEN   WBC, Wet Prep HPF POC MODERATE (A) NONE SEEN   Sperm NONE SEEN    US Ob Comp Less 14 Wks  Result Date: 08/05/2017 CLINICAL DATA:   Abdominal and pelvic pain/ cramping and vaginal bleeding for 1 week. EXAM: OBSTETRIC <14 WK [place] AND TRANSVAGINAL OB US TECHNIQUE: Both transabdominal and transvaginal ultrasound examinations were performed for complete evaluation of the gestation as well as the maternal uterus, adnexal regions, and pelvic cul-de-sac. Transvaginal technique was performed to assess early pregnancy. COMPARISON:  None.  FINDINGS: Intrauterine gestational sac: Single Yolk sac:  Present Embryo:  Not visualized MSD: 6.8  mm   5 w   2  d Subchorionic hemorrhage:  None visualized. Maternal uterus/adnexae: Several intramural or subserosal fibroids were noted, the largest measuring 3.6 x 3.5 x 3.1 cm on the right. Benign-appearing bilateral ovarian cysts measure 3.4 x 2.8 x 2.6 cm on the left and 2.1 x 2.6 x 2.2 cm on the right. No free fluid. IMPRESSION: 1. Early IUP with yolk sac but no embryo yet visualized. 2. Multiple uterine fibroids measuring up to 3.6 cm. Electronically Signed   By: Logan Bores M.D.   On: 08/05/2017 17:45   US Ob Transvaginal  Result Date: 08/05/2017 CLINICAL DATA:  Abdominal and pelvic pain/ cramping and vaginal bleeding for 1 week. EXAM: OBSTETRIC <14 WK [place] AND TRANSVAGINAL OB US TECHNIQUE: Both transabdominal and transvaginal ultrasound examinations were performed for complete evaluation of the gestation as well as the maternal uterus, adnexal regions, and pelvic cul-de-sac. Transvaginal technique was performed to assess early pregnancy. COMPARISON:  None. FINDINGS: Intrauterine gestational sac: Single Yolk sac:  Present Embryo:  Not visualized MSD: 6.8  mm   5 w   2  d Subchorionic hemorrhage:  None visualized. Maternal uterus/adnexae: Several intramural or subserosal fibroids were noted, the largest measuring 3.6 x 3.5 x 3.1 cm on the right. Benign-appearing bilateral ovarian cysts measure 3.4 x 2.8 x 2.6 cm on the left and 2.1 x 2.6 x 2.2 cm on the right. No free fluid. IMPRESSION: 1. Early IUP with yolk sac  but no embryo yet visualized. 2. Multiple uterine fibroids measuring up to 3.6 cm. Electronically Signed   By: Logan Bores M.D.   On: 08/05/2017 17:45   MDM UA, UPT Urine culture CBC, HCG, ABO/Rh Wet prep and gc/chlamydia US OB Transvaginal  US OB Comp Less 14 weeks- Normal IUP with yolk sac, no HR yet, multiple uterine fibroids  Assessment and Plan   1. Normal intrauterine pregnancy on prenatal ultrasound in first trimester   2. Vaginal spotting   3. Abdominal pain affecting pregnancy   4. Uterine fibroids affecting pregnancy in first trimester    -Discharge home in stable condition -Vaginal bleeding and abdominal pain precautions discussed -Patient advised to follow-up with [facility] for prenatal care -Patient may return to MAU as needed or if her condition were to change or worsen  Wende Mott CNM 08/05/2017, 6:10 PM

## 2017-08-05 NOTE — MAU Note (Signed)
Pt reports brown discharge for one week and cramping on right side. Today had some pink discharge.

## 2017-08-05 NOTE — Discharge Instructions (Signed)

## 2017-08-06 LAB — CULTURE, OB URINE: Culture: NO GROWTH

## 2017-08-08 LAB — GC/CHLAMYDIA PROBE AMP (~~LOC~~) NOT AT ARMC
Chlamydia: NEGATIVE
NEISSERIA GONORRHEA: NEGATIVE

## 2017-10-22 ENCOUNTER — Other Ambulatory Visit: Payer: Self-pay

## 2017-10-22 ENCOUNTER — Emergency Department: Payer: Federal, State, Local not specified - PPO

## 2017-10-22 ENCOUNTER — Encounter: Payer: Self-pay | Admitting: Emergency Medicine

## 2017-10-22 ENCOUNTER — Emergency Department
Admission: EM | Admit: 2017-10-22 | Discharge: 2017-10-22 | Disposition: A | Discharge status: Home / Self Care | Payer: Federal, State, Local not specified - PPO | Attending: Emergency Medicine | Admitting: Emergency Medicine

## 2017-10-22 DIAGNOSIS — R102 Pelvic and perineal pain: Secondary | ICD-10-CM

## 2017-10-22 DIAGNOSIS — Z79899 Other long term (current) drug therapy: Secondary | ICD-10-CM | POA: Insufficient documentation

## 2017-10-22 DIAGNOSIS — N83202 Unspecified ovarian cyst, left side: Secondary | ICD-10-CM | POA: Diagnosis not present

## 2017-10-22 DIAGNOSIS — N730 Acute parametritis and pelvic cellulitis: Secondary | ICD-10-CM

## 2017-10-22 LAB — COMPREHENSIVE METABOLIC PANEL
ALT: 20 U/L (ref 14–54)
ANION GAP: 10 (ref 5–15)
AST: 19 U/L (ref 15–41)
Albumin: 4.3 g/dL (ref 3.5–5.0)
Alkaline Phosphatase: 58 U/L (ref 38–126)
BUN: 7 mg/dL (ref 6–20)
CALCIUM: 8.8 mg/dL — AB (ref 8.9–10.3)
CHLORIDE: 104 mmol/L (ref 101–111)
CO2: 25 mmol/L (ref 22–32)
Creatinine, Ser: 0.59 mg/dL (ref 0.44–1.00)
GFR calc non Af Amer: >60 mL/min (ref >60)
GLUCOSE: 88 mg/dL (ref 65–99)
POTASSIUM: 3.7 mmol/L (ref 3.5–5.1)
SODIUM: 139 mmol/L (ref 135–145)
Total Bilirubin: 1 mg/dL (ref 0.3–1.2)
Total Protein: 7.4 g/dL (ref 6.5–8.1)

## 2017-10-22 LAB — POCT PREGNANCY, URINE: Preg Test, Ur: NEGATIVE

## 2017-10-22 LAB — URINALYSIS, COMPLETE (UACMP) WITH MICROSCOPIC
BILIRUBIN URINE: NEGATIVE
Glucose, UA: NEGATIVE mg/dL
Hgb urine dipstick: NEGATIVE
KETONES UR: NEGATIVE mg/dL
LEUKOCYTES UA: NEGATIVE
NITRITE: NEGATIVE
PH: 7 (ref 5.0–8.0)
PROTEIN: NEGATIVE mg/dL
Specific Gravity, Urine: 1.003 — ABNORMAL LOW (ref 1.005–1.030)

## 2017-10-22 LAB — CHLAMYDIA/NGC RT PCR (ARMC ONLY)
Chlamydia Tr: NOT DETECTED
N gonorrhoeae: NOT DETECTED

## 2017-10-22 LAB — CBC WITH DIFFERENTIAL/PLATELET
BASOS PCT: 1 %
Basophils Absolute: 0.1 10*3/uL (ref 0–0.1)
EOS ABS: 0.1 10*3/uL (ref 0–0.7)
EOS PCT: 2 %
HCT: 42.2 % (ref 35.0–47.0)
HEMOGLOBIN: 14.2 g/dL (ref 12.0–16.0)
Lymphocytes Relative: 14 %
Lymphs Abs: 1.2 10*3/uL (ref 1.0–3.6)
MCH: 27 pg (ref 26.0–34.0)
MCHC: 33.8 g/dL (ref 32.0–36.0)
MCV: 80.1 fL (ref 80.0–100.0)
Monocytes Absolute: 0.6 10*3/uL (ref 0.2–0.9)
Monocytes Relative: 6 %
NEUTROS PCT: 77 %
Neutro Abs: 6.8 10*3/uL — ABNORMAL HIGH (ref 1.4–6.5)
PLATELETS: 285 10*3/uL (ref 150–440)
RBC: 5.27 MIL/uL — ABNORMAL HIGH (ref 3.80–5.20)
RDW: 13 % (ref 11.5–14.5)
WBC: 8.8 10*3/uL (ref 3.6–11.0)

## 2017-10-22 LAB — WET PREP, GENITAL
SPERM: NONE SEEN
TRICH WET PREP: NONE SEEN

## 2017-10-22 MED ORDER — ACETAMINOPHEN 500 MG PO TABS
ORAL_TABLET | ORAL | Status: AC
  Filled 2017-10-22: qty 2

## 2017-10-22 MED ORDER — DOXYCYCLINE HYCLATE 100 MG PO TABS
100.0000 mg | ORAL_TABLET | Freq: Once | ORAL | Status: AC
  Administered 2017-10-22: 100 mg via ORAL
  Filled 2017-10-22: qty 1

## 2017-10-22 MED ORDER — IOPAMIDOL (ISOVUE-300) INJECTION 61%
100.0000 mL | Freq: Once | INTRAVENOUS | Status: AC | PRN
  Administered 2017-10-22: 100 mL via INTRAVENOUS

## 2017-10-22 MED ORDER — DOXYCYCLINE HYCLATE 100 MG PO TABS: 100.0000 mg | ORAL_TABLET | Freq: Two times a day (BID) | ORAL | 0 refills | Status: DC

## 2017-10-22 MED ORDER — DEXTROSE 5 % IV SOLN
250.0000 mg | Freq: Once | INTRAVENOUS | Status: DC
  Filled 2017-10-22: qty 250

## 2017-10-22 MED ORDER — ACETAMINOPHEN 500 MG PO TABS
1000.0000 mg | ORAL_TABLET | Freq: Once | ORAL | Status: AC
  Administered 2017-10-22: 1000 mg via ORAL

## 2017-10-22 MED ORDER — SODIUM CHLORIDE 0.9 % IV SOLN
250.0000 mg | Freq: Once | INTRAVENOUS | Status: AC
Start: 2017-10-22 — End: 2017-10-22
  Administered 2017-10-22: 250 mg via INTRAVENOUS
  Filled 2017-10-22: qty 250

## 2017-10-22 MED ORDER — OXYCODONE-ACETAMINOPHEN 5-325 MG PO TABS: 1.0000 | ORAL_TABLET | ORAL | 0 refills | Status: DC | PRN

## 2017-10-22 MED ORDER — DOXYCYCLINE HYCLATE 100 MG PO TABS: 100.0000 mg | ORAL_TABLET | Freq: Two times a day (BID) | ORAL | 0 refills | Status: AC

## 2017-10-22 MED ORDER — FLUCONAZOLE 50 MG PO TABS
150.0000 mg | ORAL_TABLET | Freq: Once | ORAL | Status: AC
  Administered 2017-10-22: 18:00:00 150 mg via ORAL
  Filled 2017-10-22: qty 1

## 2017-10-22 MED ORDER — SODIUM CHLORIDE 0.9 % IV BOLUS (SEPSIS)
1000.0000 mL | Freq: Once | INTRAVENOUS | Status: AC
  Administered 2017-10-22: 1000 mL via INTRAVENOUS

## 2017-10-22 NOTE — Consult Note (Signed)
Consult History and Physical   SERVICE: Gynecology   Patient Name: Meghan Leach Patient MRN:   [id number]  CC: LLQ pain  HPI: Meghan Leach is a 37 y.o. G1P0010 with sudden onset LLQ pain Friday morning at 3am that did not wake her, but that she found on waking. It worsened through the day and limited her movement. It is worse with walking, moving her legs. It was 8/10 at worst. She tried Excedrin "6 pills", heating pad without relief. No flank pain, no hx of kidney stones. No constipation, diarrhea, no hx of diverticulosis. 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 She did have upper abdominal bloating 2 days ago that resolved spontaneously. Normal bowel movement last night.  She does have hx of infertility, and just had her first pregnancy with a subsequent miscarriage in December. She does note frequent pain with ovulation but that resolves within an hour; it feels similar but not as bad as this pain. She is not on hormones. Last birth control last year when she stopped them to try to conceive. She is seeing REI Carmela Rima. Her OBGYN is in [place].  She also has a hx of endometriosis and chronic pelvic pain.  No vaginal discharge, burning, irritation, discharge. +pain with urination in the area of her LLQ pain (internally painful) if she has to strain to release urine, but no dysuria.  She is sitting quietly in bed, and pain is improved significantly with holding still.  Her TVUS found 3 small fibroids, intramural, no free fluid or complex fluid in pelvis. Normal right ovary, left ovary with a 62mm complex cyst with tiny internal septations and a 3cm complex cyst with internal ground glass appearance, but no evidence of torsion or cyst rupture.   Her WBC is normal, other labs wnl. Pregnancy test is negative.   Review of Systems: positives in bold GEN:   fevers, chills, weight changes, appetite changes, fatigue, night sweats HEENT:  HA, vision changes, hearing loss, congestion,  rhinorrhea, sinus pressure, dysphagia CV:   CP, palpitations PULM:  SOB, cough GI:  abd pain, N/V/D/C GU:  dysuria, urgency, frequency MSK:  arthralgias, myalgias, back pain, swelling SKIN:  rashes, color changes, pallor NEURO:  numbness, weakness, tingling, seizures, dizziness, tremors PSYCH:  depression, anxiety, behavioral problems, confusion  HEME/LYMPH:  easy bruising or bleeding ENDO:  heat/cold intolerance  Past Obstetrical History: OB History    Gravida Para Term Preterm AB Living   1             SAB TAB Ectopic Multiple Live Births                  Past Gynecologic History: Patient's last menstrual period was 10/07/2017.    Past Medical History: Past Medical History:  Diagnosis Date  . Endometriosis   . Fibroid, uterine   . Ovarian cyst   . PCOS (polycystic ovarian syndrome)   . Pre-diabetes   . Thyroid disease     Past Surgical History:   Past Surgical History:  Procedure Laterality Date  . OVARIAN CYST REMOVAL    . thyriod    . THYROIDECTOMY    . TONSILLECTOMY      Family History:  family history is not on file.  Social History:  Social History   Socioeconomic History  . Marital status: Single    Spouse name: Not on file  . Number of children: Not on file  . Years of education: Not on file  . Highest education level: Not  on file  Social Needs  . Financial resource strain: Not on file  . Food insecurity - worry: Not on file  . Food insecurity - inability: Not on file  . Transportation needs - medical: Not on file  . Transportation needs - non-medical: Not on file  Occupational History  . Not on file  Tobacco Use  . Smoking status: Never Smoker  . Smokeless tobacco: Never Used  Substance and Sexual Activity  . Alcohol use: No  . Drug use: No  . Sexual activity: Yes  Other Topics Concern  . Not on file  Social History Narrative  . Not on file    Home Medications:  Medications reconciled in EPIC  No current facility-administered  medications on file prior to encounter.    Current Outpatient Medications on File Prior to Encounter  Medication Sig Dispense Refill  . DiphenhydrAMINE HCl (BENADRYL ALLERGY PO) Take 1 capsule by mouth daily as needed.     . progesterone (PROMETRIUM) 100 MG capsule Take 100 mg by mouth at bedtime. Take 1 capsule at bedtime on days 17-28 of menstrual cycle.    . thyroid (ARMOUR) 60 MG tablet Take 60 mg by mouth 2 (two) times daily.       Allergies:  Allergies  Allergen Reactions  . Metformin And Related     sick    Physical Exam:  Temp:  [98.2 F (36.8 C)] 98.2 F (36.8 C) (03/02 0941) Pulse Rate:  [79-104] 79 (03/02 1000) Resp:  [20] 20 (03/02 0941) BP: (148-157)/(92-101) 148/92 (03/02 1000) SpO2:  [95 %-97 %] 97 % (03/02 1000) Weight:  [99.8 kg (220 lb)] 99.8 kg (220 lb) (03/02 0942)   General Appearance:  Well developed, well nourished, no acute distress, alert and oriented x3. Sitting quietly in bed, does not appear distressed. HEENT:  Normocephalic atraumatic, extraocular movements intact, moist mucous membranes Cardiovascular:  Normal S1/S2, regular rate and rhythm, no murmurs Pulmonary:  clear to auscultation, no wheezes, rales or rhonchi, symmetric air entry, good air exchange Abdomen:  Bowel sounds present, soft, nondistended, no abnormal masses, no epigastric pain. No peritoneal signs. +pain over LLQ with palpation, Carnett's negative, rovsings negative.No rebound tenderness. No RUQ pain.  Extremities:  Full range of motion, no pedal edema, 2+ distal pulses, no tenderness Skin:  normal coloration and turgor, no rashes, no suspicious skin lesions noted  Neurologic:  Cranial nerves 2-12 grossly intact, normal muscle tone, strength 5/5 all four extremities Psychiatric:  Normal mood and affect, appropriate, no AH/VH Pelvic:  NEFG,    Labs/Studies:   CBC and Coags:  Lab Results  Component Value Date   WBC 8.8 10/22/2017   NEUTOPHILPCT 77 10/22/2017   EOSPCT 2  10/22/2017   BASOPCT 1 10/22/2017   LYMPHOPCT 14 10/22/2017   HGB 14.2 10/22/2017   HCT 42.2 10/22/2017   MCV 80.1 10/22/2017   PLT 285 10/22/2017   CMP:  Lab Results  Component Value Date   NA 139 10/22/2017   K 3.7 10/22/2017   CL 104 10/22/2017   CO2 25 10/22/2017   BUN 7 10/22/2017   CREATININE 0.59 10/22/2017   PROT 7.4 10/22/2017   BILITOT 1.0 10/22/2017   ALT 20 10/22/2017   AST 19 10/22/2017   ALKPHOS 58 10/22/2017    Other Imaging: US Pelvis Transvanginal Non-ob (tv Only)  Result Date: 10/22/2017 CLINICAL DATA:  Left pelvic pain for 1 day. EXAM: TRANSABDOMINAL AND TRANSVAGINAL ULTRASOUND OF PELVIS DOPPLER ULTRASOUND OF OVARIES TECHNIQUE: Both transabdominal and transvaginal  ultrasound examinations of the pelvis were performed. Transabdominal technique was performed for global imaging of the pelvis including uterus, ovaries, adnexal regions, and pelvic cul-de-sac. It was necessary to proceed with endovaginal exam following the transabdominal exam to visualize the endometrium and ovaries to better advantage. Color and duplex Doppler ultrasound was utilized to evaluate blood flow to the ovaries. COMPARISON:  None. FINDINGS: Uterus Measurements: 9.1 x 5.1 x 4.5 cm. Three discrete fibroids are noted, a mural fibroid from the posterior mid uterine segment measuring 3.1 x 2.5 x 2.6 cm, a mural to slightly subserosal fibroid, left mid to upper uterine segment, measuring 12 mm and a subserosal fundal fibroid measuring 12 mm. No other uterine masses. Endometrium Thickness: 8 mm.  No focal abnormality visualized. Right ovary Measurements: 2.4 x 1.5 x 1.8 cm. Normal appearance/no adnexal mass. Left ovary Measurements: 5.3 x 3.1 x 3.8 cm. Dominant cyst measuring 2.8 x 2.9 x 3.5 cm with some internal debris. There is a smaller adjacent hemorrhagic cyst with internal septations measuring 1.1 x 1.8 x 1.3 cm. No left adnexal masses. Pulsed Doppler evaluation of both ovaries demonstrates normal  low-resistance arterial and venous waveforms. Other findings No abnormal free fluid. IMPRESSION: 1. No acute findings. 2. 3 discrete uterine fibroids. 3. Left ovarian cysts, 1 appears to be a small, 18 mm hemorrhagic cyst. Largest cyst measures 3.5 cm. This is almost certainly benign, and no specific imaging follow up is recommended according to the Society of Radiologists in [place] Statement (D Clovis Riley et al. Management of Asymptomatic Ovarian and Other Adnexal Cysts Imaged at [place]: Society of Radiologists in [place] Statement 2010. Radiology 256 (Sept 2010): 943-954.). 4. No other abnormalities. Electronically Signed   By: Lajean Manes M.D.   On: 10/22/2017 12:14   US Pelvis Complete  Result Date: 10/22/2017 CLINICAL DATA:  Left pelvic pain for 1 day. EXAM: TRANSABDOMINAL AND TRANSVAGINAL ULTRASOUND OF PELVIS DOPPLER ULTRASOUND OF OVARIES TECHNIQUE: Both transabdominal and transvaginal ultrasound examinations of the pelvis were performed. Transabdominal technique was performed for global imaging of the pelvis including uterus, ovaries, adnexal regions, and pelvic cul-de-sac. It was necessary to proceed with endovaginal exam following the transabdominal exam to visualize the endometrium and ovaries to better advantage. Color and duplex Doppler ultrasound was utilized to evaluate blood flow to the ovaries. COMPARISON:  None. FINDINGS: Uterus Measurements: 9.1 x 5.1 x 4.5 cm. Three discrete fibroids are noted, a mural fibroid from the posterior mid uterine segment measuring 3.1 x 2.5 x 2.6 cm, a mural to slightly subserosal fibroid, left mid to upper uterine segment, measuring 12 mm and a subserosal fundal fibroid measuring 12 mm. No other uterine masses. Endometrium Thickness: 8 mm.  No focal abnormality visualized. Right ovary Measurements: 2.4 x 1.5 x 1.8 cm. Normal appearance/no adnexal mass. Left ovary Measurements: 5.3 x 3.1 x 3.8 cm. Dominant cyst measuring  2.8 x 2.9 x 3.5 cm with some internal debris. There is a smaller adjacent hemorrhagic cyst with internal septations measuring 1.1 x 1.8 x 1.3 cm. No left adnexal masses. Pulsed Doppler evaluation of both ovaries demonstrates normal low-resistance arterial and venous waveforms. Other findings No abnormal free fluid. IMPRESSION: 1. No acute findings. 2. 3 discrete uterine fibroids. 3. Left ovarian cysts, 1 appears to be a small, 18 mm hemorrhagic cyst. Largest cyst measures 3.5 cm. This is almost certainly benign, and no specific imaging follow up is recommended according to the Society of Radiologists in [place] Statement Gordy Levan et  al. Management of Asymptomatic Ovarian and Other Adnexal Cysts Imaged at [place]: Society of Radiologists in [place] Statement 2010. Radiology 256 (Sept 2010): 943-954.). 4. No other abnormalities. Electronically Signed   By: Lajean Manes M.D.   On: 10/22/2017 12:14   US Pelvic Doppler (torsion R/o Or Mass Arterial Flow)  Result Date: 10/22/2017 CLINICAL DATA:  Left pelvic pain for 1 day. EXAM: TRANSABDOMINAL AND TRANSVAGINAL ULTRASOUND OF PELVIS DOPPLER ULTRASOUND OF OVARIES TECHNIQUE: Both transabdominal and transvaginal ultrasound examinations of the pelvis were performed. Transabdominal technique was performed for global imaging of the pelvis including uterus, ovaries, adnexal regions, and pelvic cul-de-sac. It was necessary to proceed with endovaginal exam following the transabdominal exam to visualize the endometrium and ovaries to better advantage. Color and duplex Doppler ultrasound was utilized to evaluate blood flow to the ovaries. COMPARISON:  None. FINDINGS: Uterus Measurements: 9.1 x 5.1 x 4.5 cm. Three discrete fibroids are noted, a mural fibroid from the posterior mid uterine segment measuring 3.1 x 2.5 x 2.6 cm, a mural to slightly subserosal fibroid, left mid to upper uterine segment, measuring 12 mm and a subserosal  fundal fibroid measuring 12 mm. No other uterine masses. Endometrium Thickness: 8 mm.  No focal abnormality visualized. Right ovary Measurements: 2.4 x 1.5 x 1.8 cm. Normal appearance/no adnexal mass. Left ovary Measurements: 5.3 x 3.1 x 3.8 cm. Dominant cyst measuring 2.8 x 2.9 x 3.5 cm with some internal debris. There is a smaller adjacent hemorrhagic cyst with internal septations measuring 1.1 x 1.8 x 1.3 cm. No left adnexal masses. Pulsed Doppler evaluation of both ovaries demonstrates normal low-resistance arterial and venous waveforms. Other findings No abnormal free fluid. IMPRESSION: 1. No acute findings. 2. 3 discrete uterine fibroids. 3. Left ovarian cysts, 1 appears to be a small, 18 mm hemorrhagic cyst. Largest cyst measures 3.5 cm. This is almost certainly benign, and no specific imaging follow up is recommended according to the Society of Radiologists in [place] Statement (D Clovis Riley et al. Management of Asymptomatic Ovarian and Other Adnexal Cysts Imaged at [place]: Society of Radiologists in [place] Statement 2010. Radiology 256 (Sept 2010): 943-954.). 4. No other abnormalities. Electronically Signed   By: Lajean Manes M.D.   On: 10/22/2017 12:14     Assessment / Plan:   Shonta A Leach is a 37 y.o. G1P0010 who presents with LLQ Pain x2 days, worsened with movement and using core muscles, without radiation. Her pregnancy test was negative.  1. We discussed her findings on ultrasound of a small left ovarian cyst. Her pain does not appear to be bowel related, nor does it appear to be musculoskeletal. It does not appear to be bladder related. It is reasonable that she is having left ovarian pain, but without a ruptured cyst and no evidence of torsion either on imaging or on my exam or with her symptoms, I have given her a 50% chance of resolving her pain with a ovarian cystectomy.   Her pain is significant enough that it is reasonable to proceed  with diagnostic laparoscopy.  However, I am not convinced that this cyst is causing her pain, and with our joint decision making we have decided to proceed with expectant management.  Because she has only taken Tylenol for pain control, I recommended that she try stronger narcotics, scheduled ibuprofen x48hrs 800mg  q8 hrs and tylenol 1000mg  q8 hours.   We have talked through the concerning symptoms of pain that is not controlled with oxycodone,  systemic symptoms, peritoneal signs, or fever. If any of these develop, I have recommended that she return promptly. All of her questions and her family's questions were answered to best of my ability.

## 2017-10-22 NOTE — ED Notes (Signed)
ED Provider at bedside. 

## 2017-10-22 NOTE — ED Provider Notes (Signed)
[facility] Emergency Department Provider Note  ____________________________________________   First MD Initiated Contact with Patient 10/22/17 [phone]     (approximate)  I have reviewed the triage vital signs and the nursing notes.   HISTORY  Chief Complaint Abdominal Pain   HPI Meghan Leach is a 37 y.o. female with a history of ovarian cysts as well as endometriosis and fibroids who is presenting to the emergency department today with left lower quadrant abdominal pain.  She says that the abdominal pain started Friday morning at 3 AM.  She says it is an 8 out of 10 right now but sometimes becomes more severe when she moves/walks.  Denies any radiation through to the back.  Denies any nausea vomiting or diarrhea.  Says that she had a miscarriage several months ago and then her last period on February 15.  Does sometimes get midcycle/ovulation pain but usually last for only an hour.  She denies any vaginal bleeding or discharge.  Past Medical History:  Diagnosis Date  . Endometriosis   . Fibroid, uterine   . Ovarian cyst   . PCOS (polycystic ovarian syndrome)   . Pre-diabetes   . Thyroid disease     There are no active problems to display for this patient.   Past Surgical History:  Procedure Laterality Date  . OVARIAN CYST REMOVAL    . thyriod    . THYROIDECTOMY    . TONSILLECTOMY      Prior to Admission medications   Medication Sig Start Date End Date Taking? Authorizing Provider  DiphenhydrAMINE HCl (BENADRYL ALLERGY PO) Take 1 capsule by mouth daily as needed.    Yes [provider]  progesterone (PROMETRIUM) 100 MG capsule Take 100 mg by mouth at bedtime. Take 1 capsule at bedtime on days 17-28 of menstrual cycle. 10/22/17  Yes [provider]  thyroid (ARMOUR) 60 MG tablet Take 60 mg by mouth 2 (two) times daily.    Yes [provider]    Allergies Metformin and related  History reviewed. No pertinent family  history.  Social History Social History   Tobacco Use  . Smoking status: Never Smoker  . Smokeless tobacco: Never Used  Substance Use Topics  . Alcohol use: No  . Drug use: No    Review of Systems  Constitutional: No fever/chills Eyes: No visual changes. ENT: No sore throat. Cardiovascular: Denies chest pain. Respiratory: Denies shortness of breath. Gastrointestinal: No nausea, no vomiting.  No diarrhea.  No constipation. Genitourinary: Negative for dysuria. Musculoskeletal: Negative for back pain. Skin: Negative for rash. Neurological: Negative for headaches, focal weakness or numbness.   ____________________________________________   PHYSICAL EXAM:  VITAL SIGNS: ED Triage Vitals  Enc Vitals Group     BP 10/22/17 0944 (!) 157/101     Pulse Rate 10/22/17 0941 (!) 104     Resp 10/22/17 0941 20     Temp 10/22/17 0941 98.2 F (36.8 C)     Temp Source 10/22/17 0941 Oral     SpO2 10/22/17 0941 95 %     Weight 10/22/17 0942 220 lb (99.8 kg)     Height 10/22/17 0942 5\' 5"  (1.651 m)     Head Circumference --      Peak Flow --      Pain Score 10/22/17 0942 7     Pain Loc --      Pain Edu? --      Excl. in [place]? --     Constitutional:  Alert and oriented. Well appearing and in no acute distress. Eyes: Conjunctivae are normal.  Head: Atraumatic. Nose: No congestion/rhinnorhea. Mouth/Throat: Mucous membranes are moist.  Neck: No stridor.   Cardiovascular: Normal rate, regular rhythm. Grossly normal heart sounds.   Respiratory: Normal respiratory effort.  No retractions. Lungs CTAB. Gastrointestinal: Soft moderate left lower quadrant tenderness to palpation without any rebound or guarding.  No distention. No CVA tenderness. Musculoskeletal: No lower extremity tenderness nor edema.  No joint effusions. Neurologic:  Normal speech and language. No gross focal neurologic deficits are appreciated. Skin:  Skin is warm, dry and intact. No rash noted. Psychiatric: Mood and  affect are normal. Speech and behavior are normal.  ____________________________________________   LABS (all labs ordered are listed, but only abnormal results are displayed)  Labs Reviewed  CBC WITH DIFFERENTIAL/PLATELET - Abnormal; Notable for the following components:      Result Value   RBC 5.27 (*)    Neutro Abs 6.8 (*)    All other components within normal limits  COMPREHENSIVE METABOLIC PANEL - Abnormal; Notable for the following components:   Calcium 8.8 (*)    All other components within normal limits  URINALYSIS, COMPLETE (UACMP) WITH MICROSCOPIC - Abnormal; Notable for the following components:   Color, Urine YELLOW (*)    APPearance CLEAR (*)    Specific Gravity, Urine 1.003 (*)    Bacteria, UA FEW (*)    Squamous Epithelial / LPF 0-5 (*)    All other components within normal limits  POC URINE PREG, ED  POCT PREGNANCY, URINE   ____________________________________________  EKG  ____________________________________________  RADIOLOGY  No acute finding in the ultrasound of the pelvis.  Left ovarian cysts.  18 mm hemorrhagic cyst and another 3.5 cm cyst that appears benign. ____________________________________________   PROCEDURES  Procedure(s) performed:   Procedures  Critical Care performed:   ____________________________________________   INITIAL IMPRESSION / ASSESSMENT AND PLAN / ED COURSE  Pertinent labs & imaging results that were available during my care of the patient were reviewed by me and considered in my medical decision making (see chart for details).  Differential diagnosis includes, but is not limited to, ovarian cyst, ovarian torsion, acute appendicitis, diverticulitis, urinary tract infection/pyelonephritis, endometriosis, bowel obstruction, colitis, renal colic, gastroenteritis, hernia, fibroids, endometriosis, pregnancy related pain including ectopic pregnancy, etc. As part of my medical decision making, I reviewed the following data  within the electronic MEDICAL RECORD NUMBER Notes from prior ED visits   Patient asked and does not want any pain meds at this time.   ----------------------------------------- 3:11 PM on 10/22/2017 -----------------------------------------  Patient evaluated by Dr. Leafy Ro who is suspicious about other causes of pain including diverticulitis.  Patient discussed CAT scan with Dr. Leafy Ro.  We will CAT scan the patient.  However, if the scan is negative she will follow-up with her OB/GYN.  Dr. Leafy Ro recommends several days worth of opiates as needed at home for pain control and that the patient should return for any worsening or concerning symptoms.  Signed out to Dr. Corky Downs for follow-up of CT results and re-eval.  ____________________________________________   FINAL CLINICAL IMPRESSION(S) / ED DIAGNOSES  Final diagnoses:  Pelvic pain  Pelvic pain      NEW MEDICATIONS STARTED DURING THIS VISIT:  New Prescriptions   No medications on file     Note:  This document was prepared using Dragon voice recognition software and may include unintentional dictation errors.     Orbie Pyo, MD 10/22/17 [phone]

## 2017-10-22 NOTE — ED Notes (Signed)
Patient transported to US 

## 2017-10-22 NOTE — ED Notes (Signed)
Pt c/o abd pain in LLQ, however has diffuse abd pain. Pain worse in lower abd. Pt is guarding upon palpation to LLQ and RLQ. Pt reports hx of ovarian cysts.

## 2017-10-22 NOTE — ED Triage Notes (Signed)
Here for LLQ pain since yesterday. Hx PCOS and ovarian cyst.  Not improved with motrin.  No urinary sx. Denies NVD.  Ambulatory. No fevers

## 2017-10-22 NOTE — ED Provider Notes (Signed)
CT scan shows pelvic mesenteric stranding..non-specific. Performed pelvic exam, significant whitish discharge, minimal cmt. D/w dr. Leafy Ro, recommends tx for PID and outpatient followup   Lavonia Drafts, MD 10/22/17 1743

## 2017-10-22 NOTE — ED Notes (Signed)
Patient transported to X-ray 

## 2018-03-17 ENCOUNTER — Other Ambulatory Visit (HOSPITAL_COMMUNITY): Payer: Self-pay | Admitting: Obstetrics and Gynecology

## 2018-03-17 DIAGNOSIS — Z3141 Encounter for fertility testing: Secondary | ICD-10-CM

## 2018-03-24 ENCOUNTER — Ambulatory Visit (HOSPITAL_COMMUNITY)
Admission: RE | Admit: 2018-03-24 | Discharge: 2018-03-24 | Disposition: A | Discharge status: Home / Self Care | Payer: Federal, State, Local not specified - PPO | Source: Ambulatory Visit | Attending: Obstetrics and Gynecology | Admitting: Obstetrics and Gynecology

## 2018-03-24 DIAGNOSIS — Z3141 Encounter for fertility testing: Secondary | ICD-10-CM | POA: Diagnosis not present

## 2018-03-24 MED ORDER — IOPAMIDOL (ISOVUE-300) INJECTION 61%
30.0000 mL | Freq: Once | INTRAVENOUS | Status: AC | PRN
Start: 2018-03-24 — End: 2018-03-24
  Administered 2018-03-24: 11 mL

## 2018-03-31 IMAGING — CR DG CHEST 2V
2 series · 2 of 2 positions shown · non-contrast
Comparison: Radiographs October 09, 2013.

CLINICAL DATA: Nonproductive cough.

EXAM:
CHEST  2 VIEW

[chest pa]
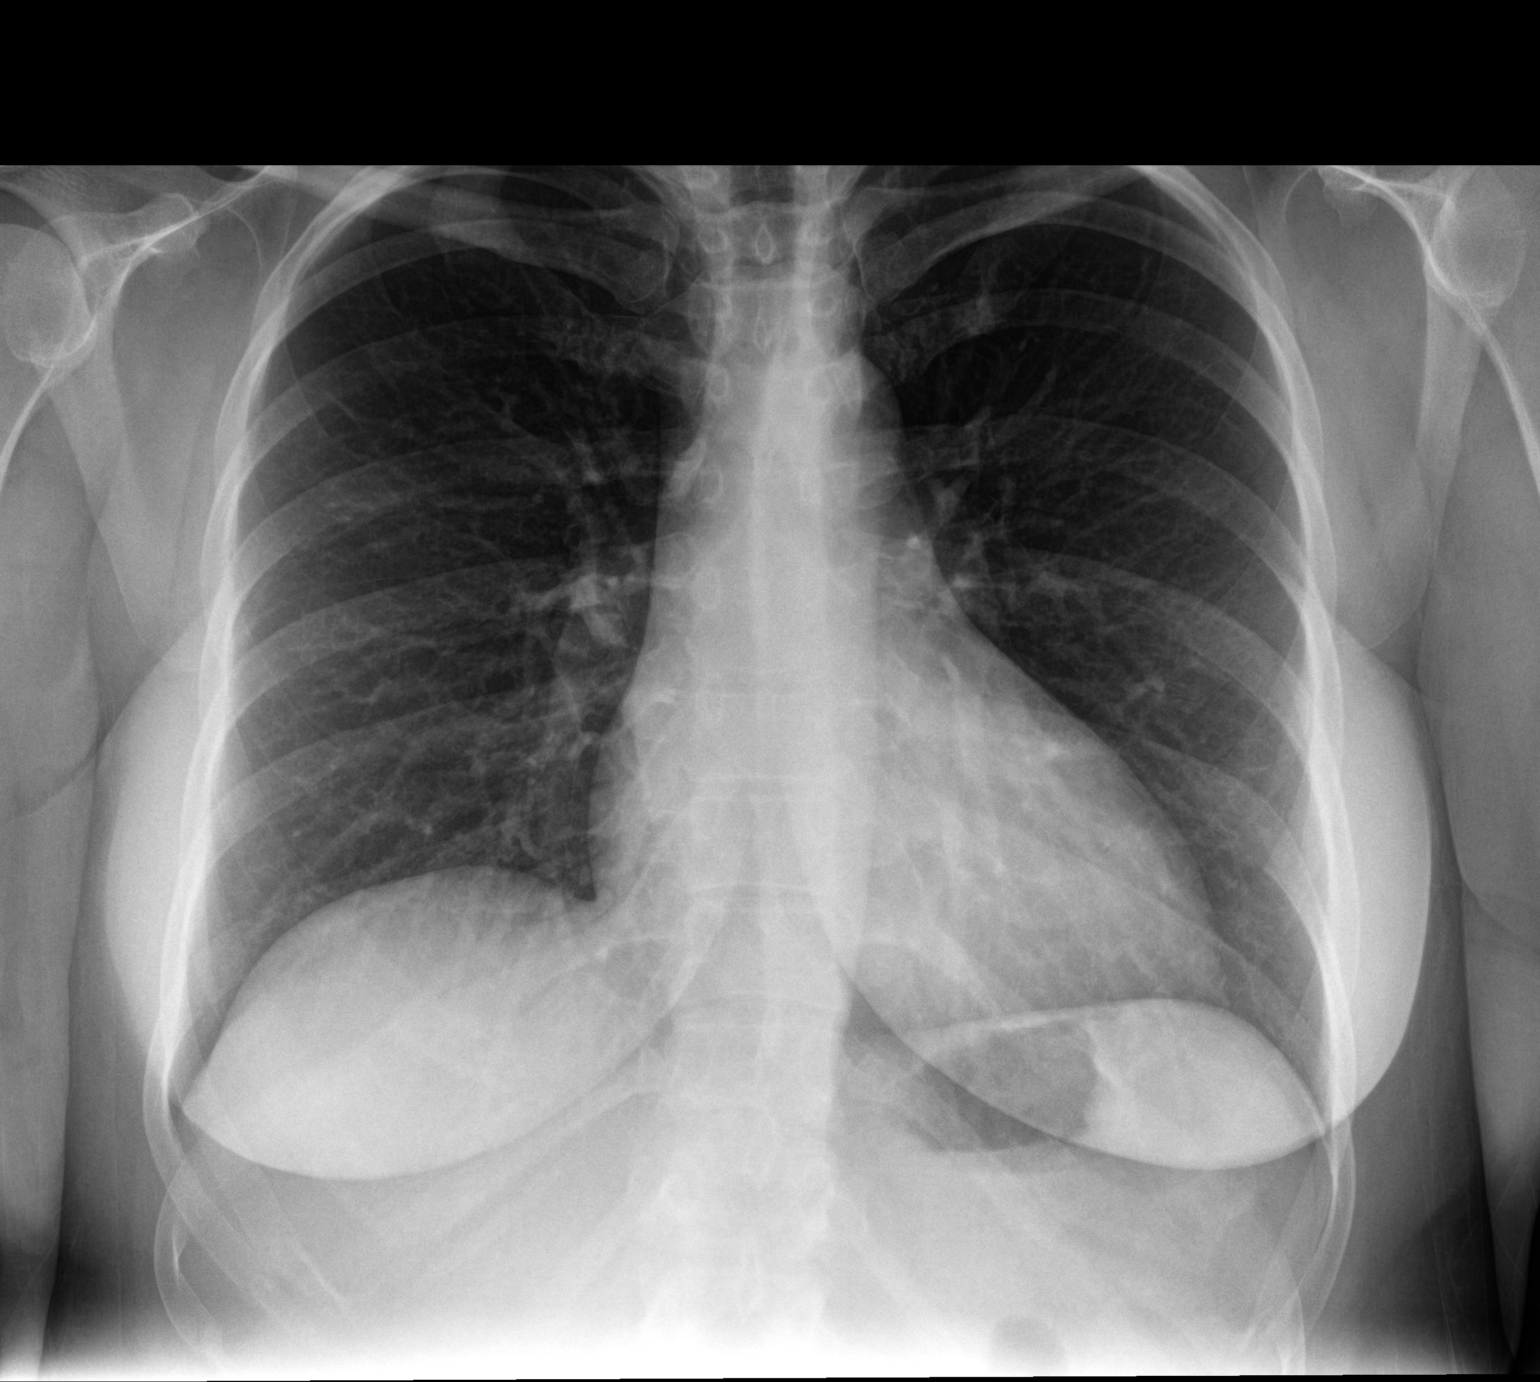

[chest lat]
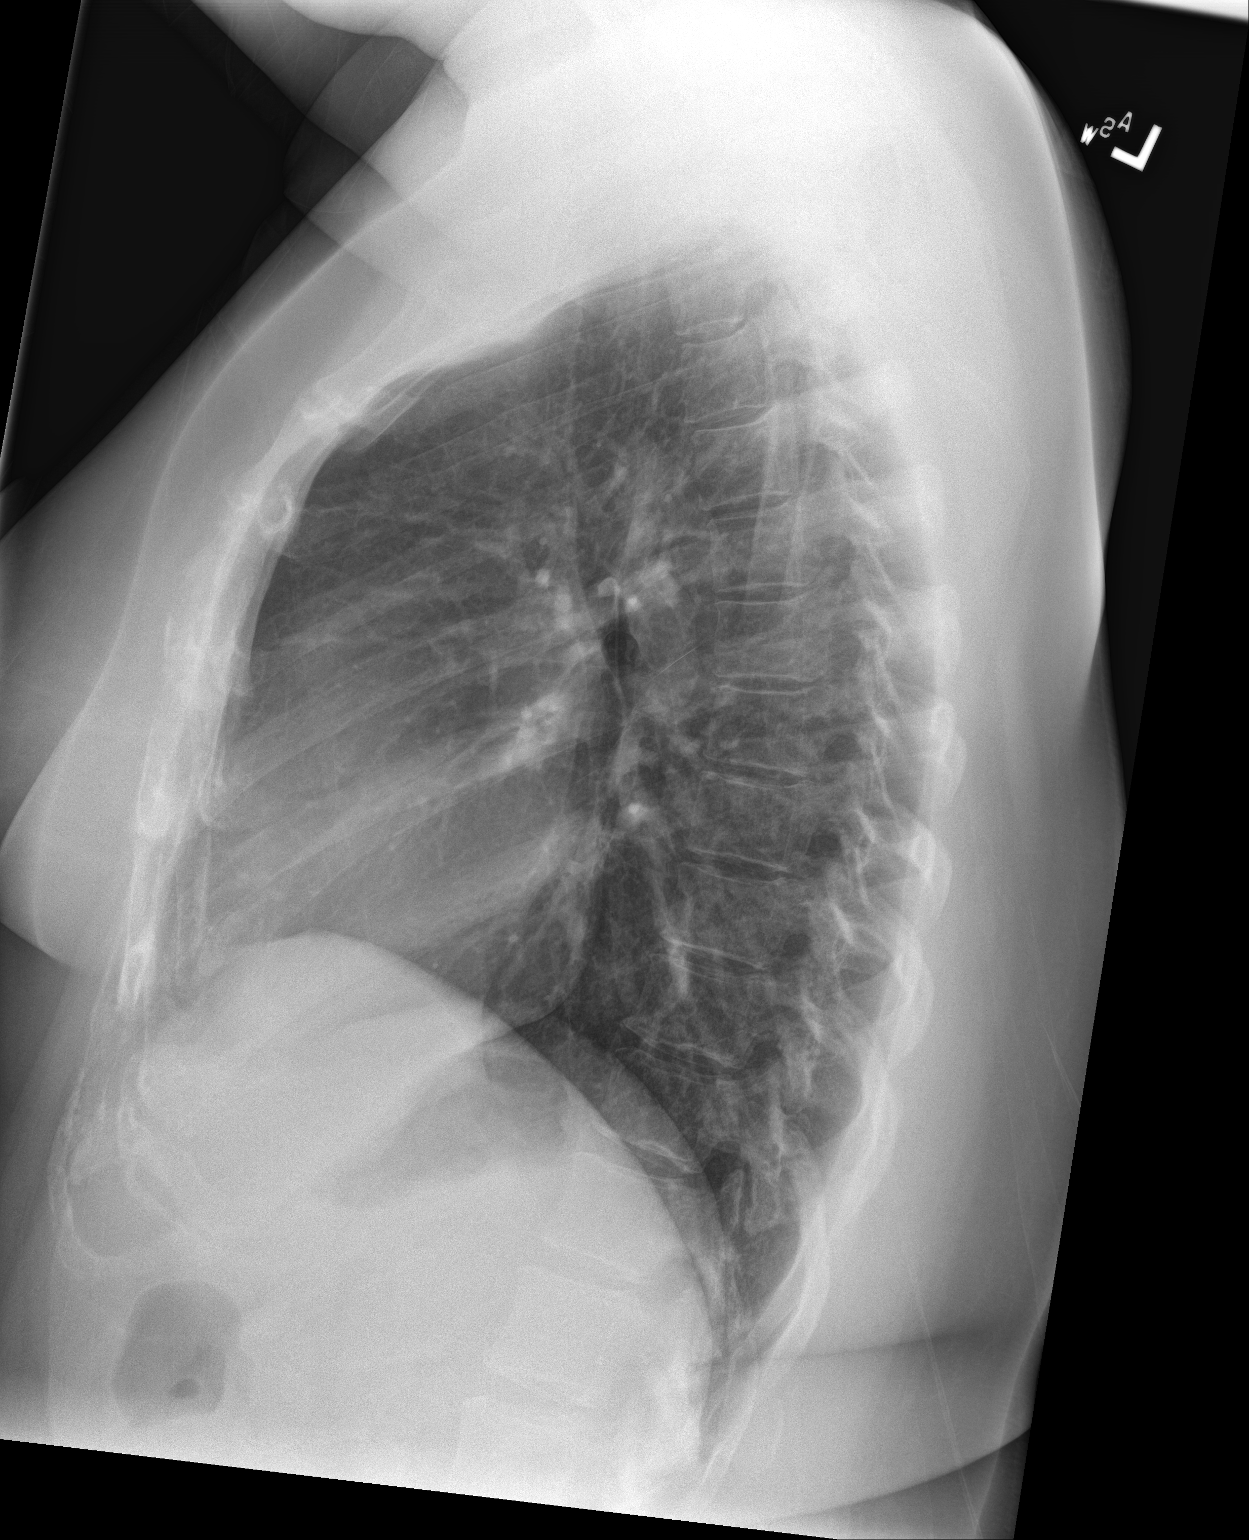

[2 of 2 positions shown; findings below may reference images not displayed]

FINDINGS: The heart size and mediastinal contours are within normal limits.
Both lungs are clear. No pneumothorax or pleural effusion is noted.
The visualized skeletal structures are unremarkable.
IMPRESSION: No active cardiopulmonary disease.

## 2019-02-04 IMAGING — US US OB TRANSVAGINAL
1 series · 15 of 28 positions shown · non-contrast
Comparison: None.

CLINICAL DATA: Abdominal and pelvic pain/ cramping and vaginal
bleeding for 1 week.

EXAM:
OBSTETRIC <14 WK US AND TRANSVAGINAL OB US
TECHNIQUE: Both transabdominal and transvaginal ultrasound examinations were
performed for complete evaluation of the gestation as well as the
maternal uterus, adnexal regions, and pelvic cul-de-sac.
Transvaginal technique was performed to assess early pregnancy.

[Series 1: us ob transvaginal · 15 of 81 slices shown]
[im 1/81]
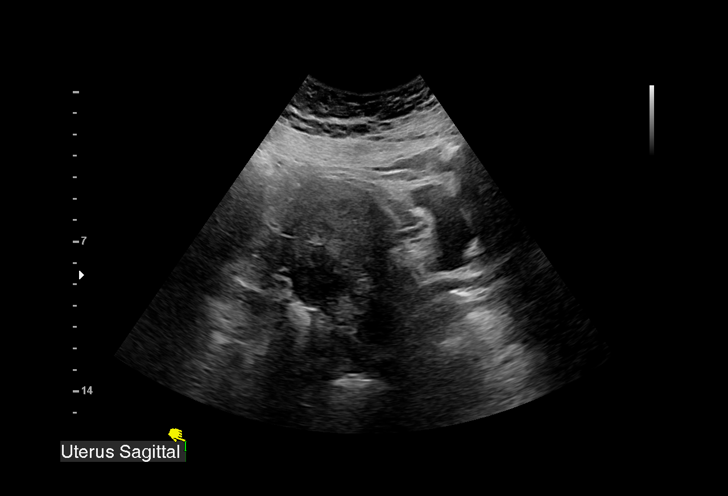
[im 6/81]
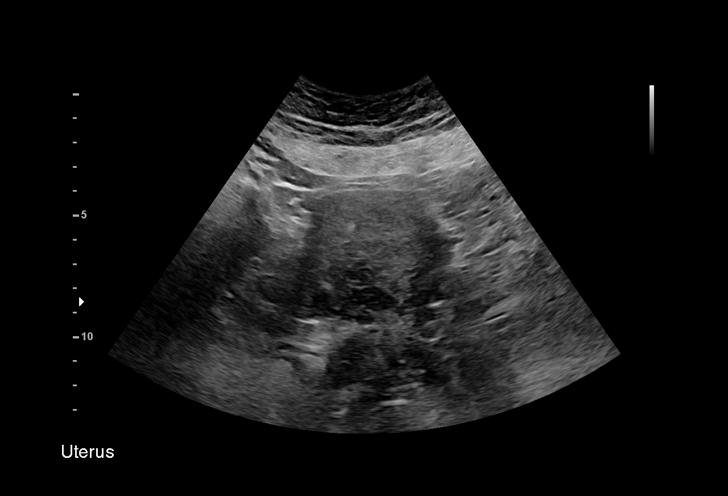
[im 12/81]
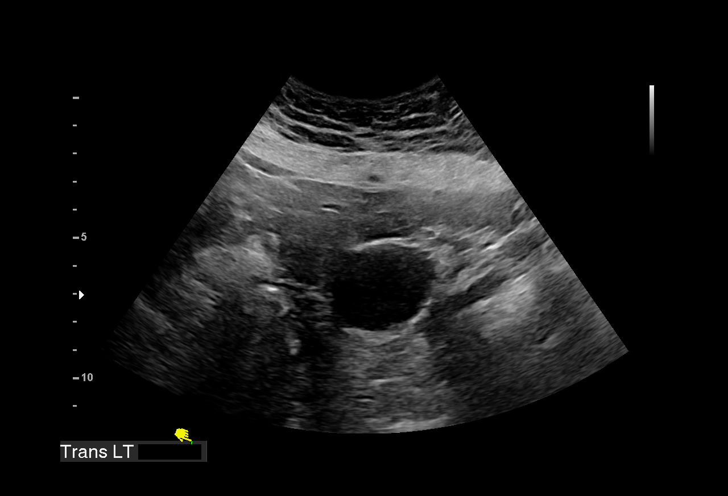
[im 18/81]
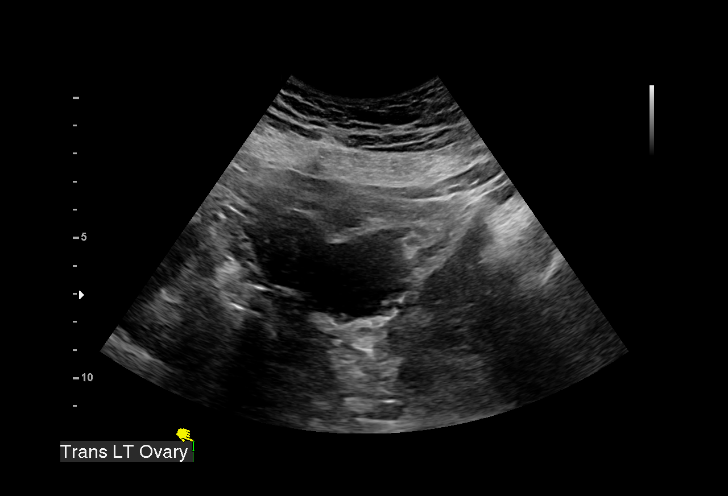
[im 24/81]
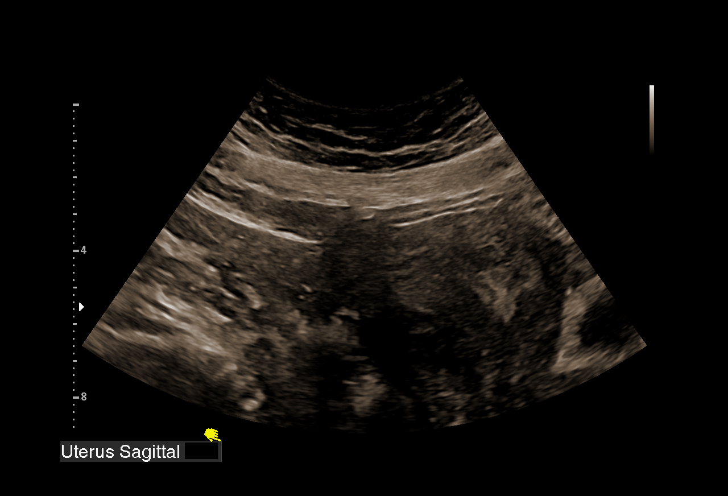
[im 30/81]
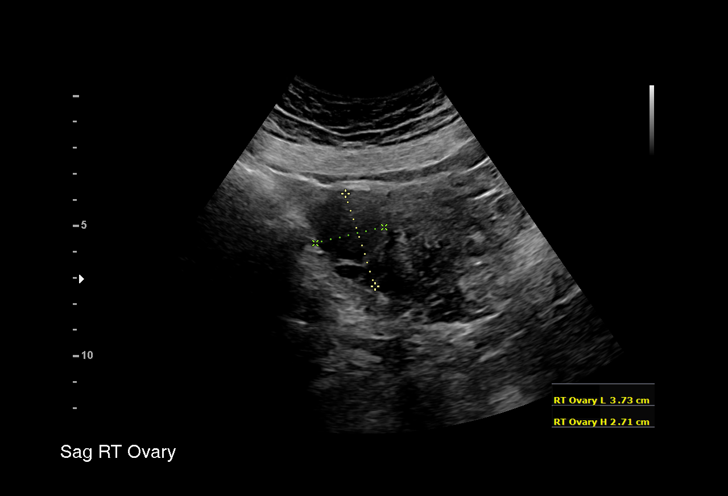
[im 36/81]
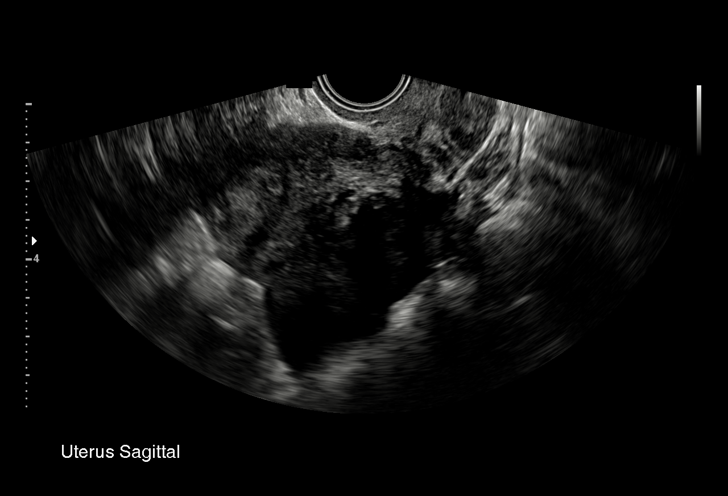
[im 42/81]
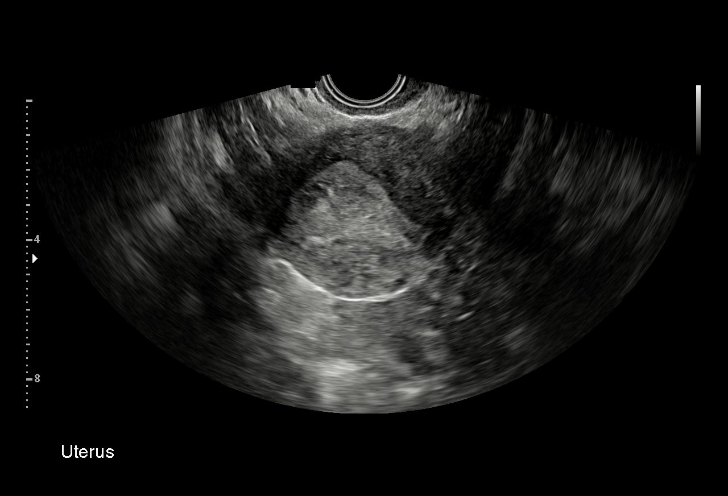
[im 45/81]
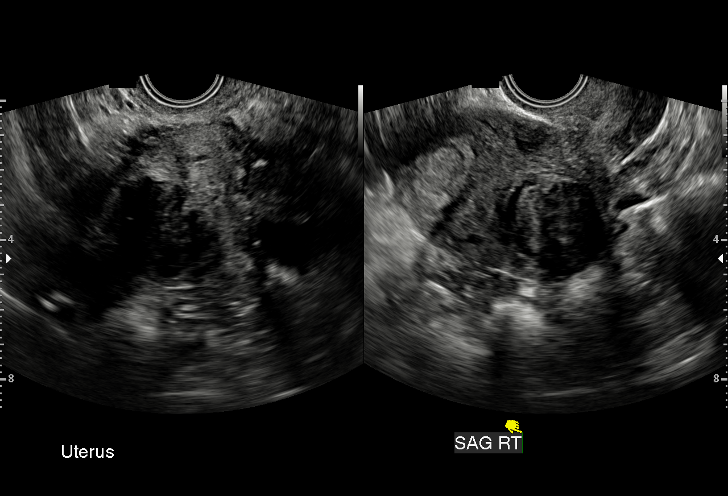
[im 51/81]
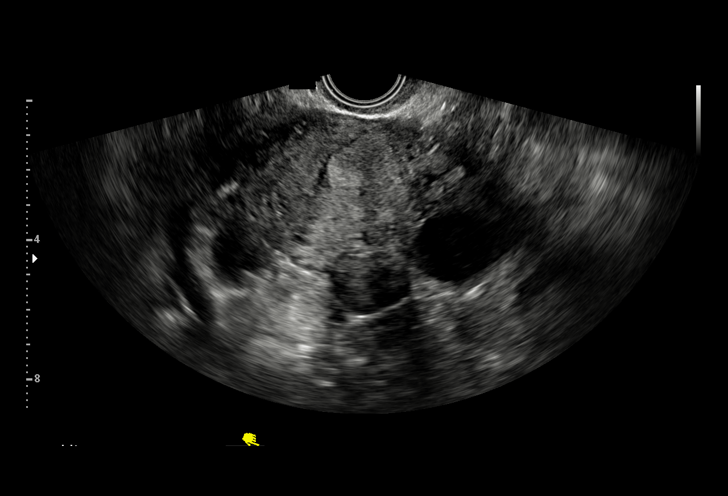
[im 57/81]
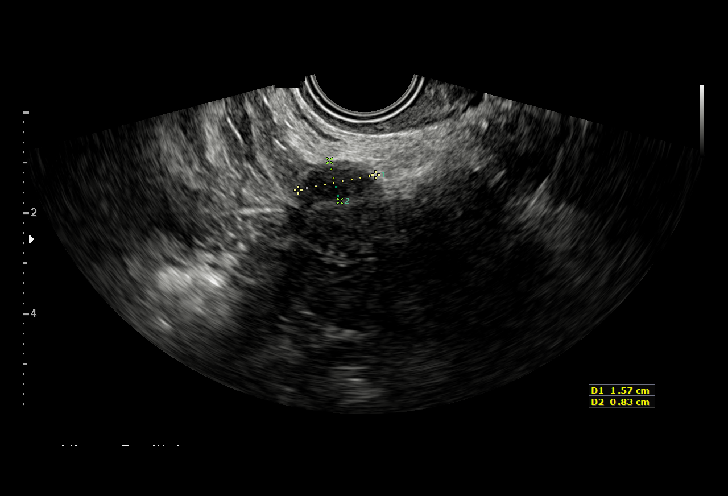
[im 63/81]
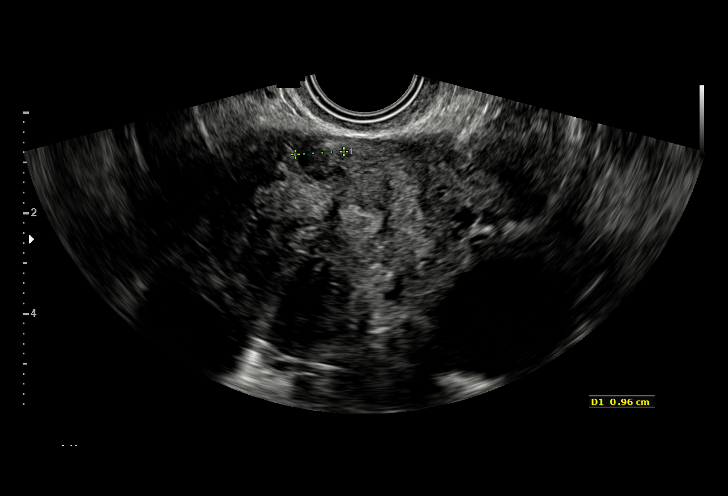
[im 69/81]
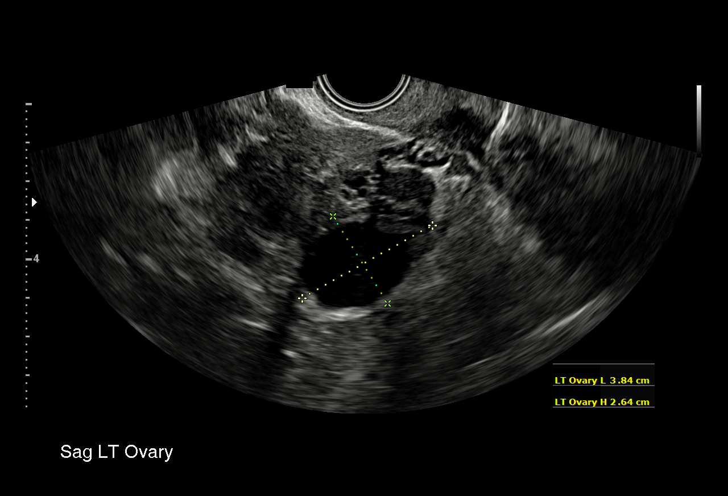
[im 75/81]
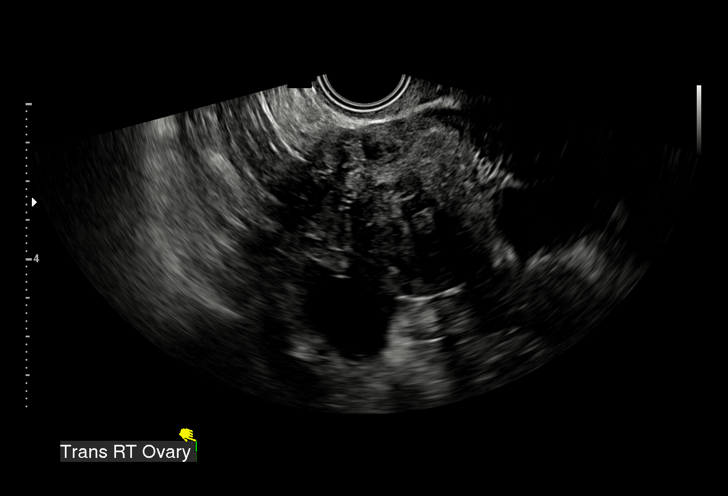
[im 81/81]
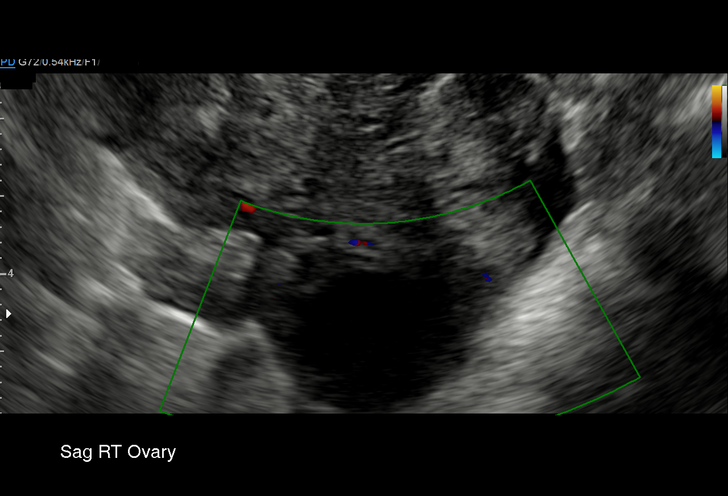

[15 of 28 positions shown; findings below may reference images not displayed]

FINDINGS: Intrauterine gestational sac: Single

Yolk sac:  Present

Embryo:  Not visualized

MSD: 6.8  mm   5 w   2  d

Subchorionic hemorrhage:  None visualized.

Maternal uterus/adnexae: Several intramural or subserosal fibroids
were noted, the largest measuring 3.6 x 3.5 x 3.1 cm on the right.
Benign-appearing bilateral ovarian cysts measure 3.4 x 2.8 x 2.6 cm
on the left and 2.1 x 2.6 x 2.2 cm on the right. No free fluid.
IMPRESSION: 1. Early IUP with yolk sac but no embryo yet visualized.
2. Multiple uterine fibroids measuring up to 3.6 cm.

## 2019-04-23 IMAGING — CT CT ABD-PELV W/ CM
2 of 4 series · 16 of 46 positions shown, 18 images · IV contrast (APPLIED)
Comparison: Pelvic ultrasound dated 10/22/2017.

CLINICAL DATA: Left lower quadrant abdominal pain, history of
endometriosis, uterine fibroids, and ovarian cysts.

EXAM:
CT ABDOMEN AND PELVIS WITH CONTRAST
TECHNIQUE: Multidetector CT imaging of the abdomen and pelvis was performed
using the standard protocol following bolus administration of
intravenous contrast.
CONTRAST:  100mL BWWKJ2-GMM IOPAMIDOL (BWWKJ2-GMM) INJECTION 61%

[Series 2: routine abd/pel with · axial · 0.78mm/px · z∈[-536,-86]mm · 13 of 100 slices shown, 15 images]
[im 5/100  soft-tissue]
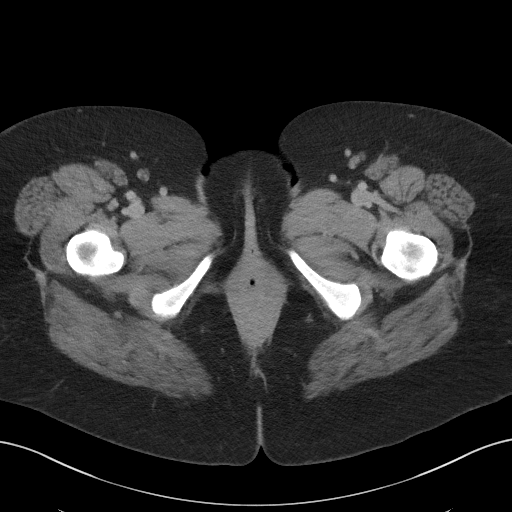
[im 5/100  bone]
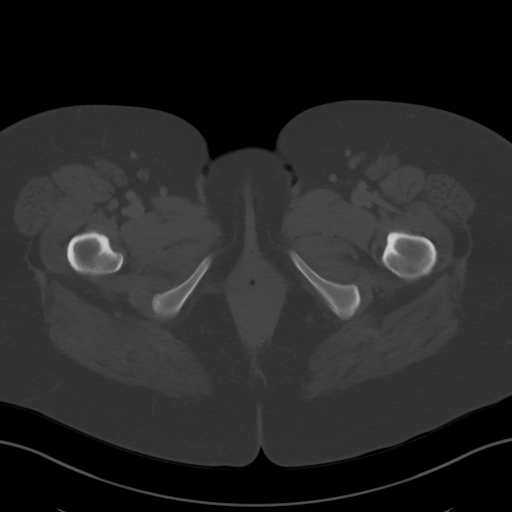
[im 13/100  soft-tissue]
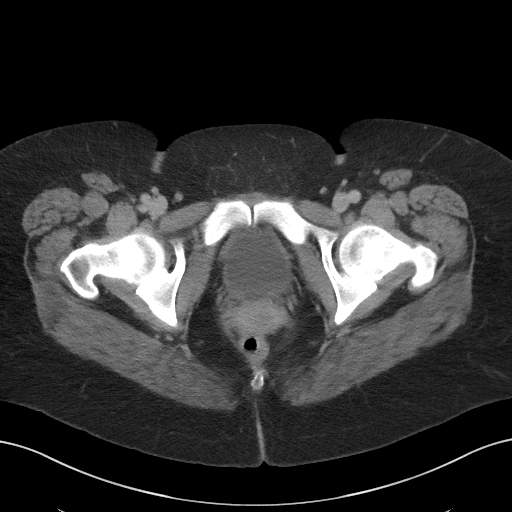
[im 22/100  soft-tissue]
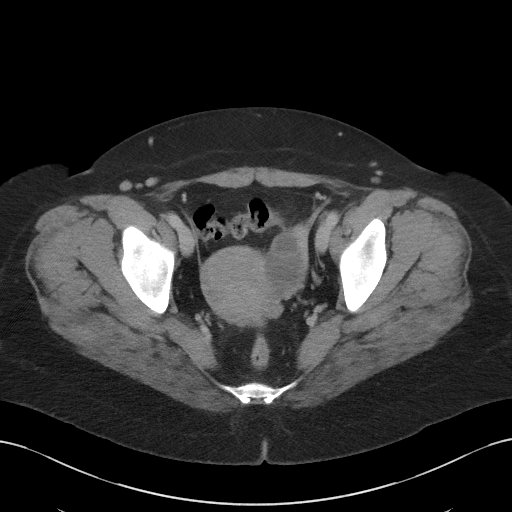
[im 26/100  soft-tissue]
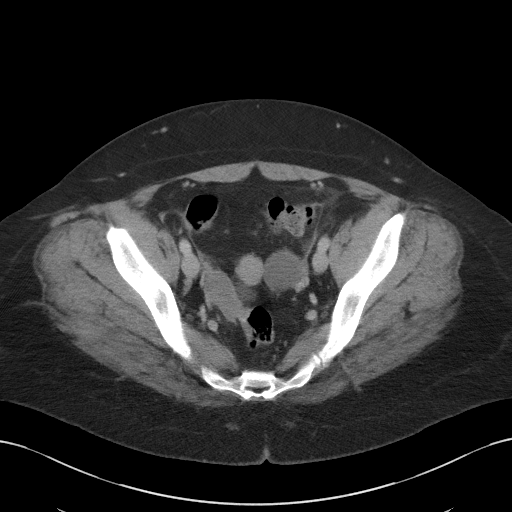
[im 35/100  soft-tissue]
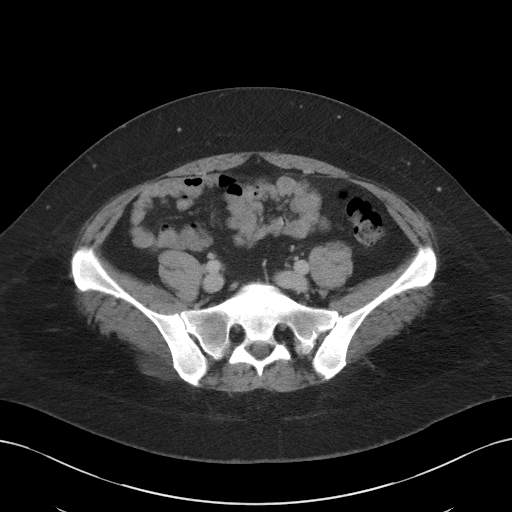
[im 44/100  soft-tissue]
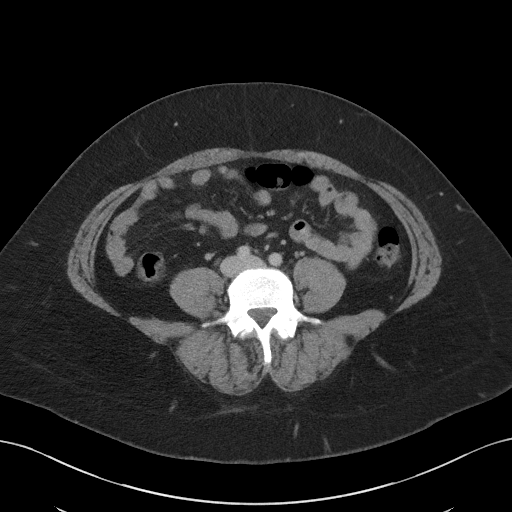
[im 52/100  soft-tissue]
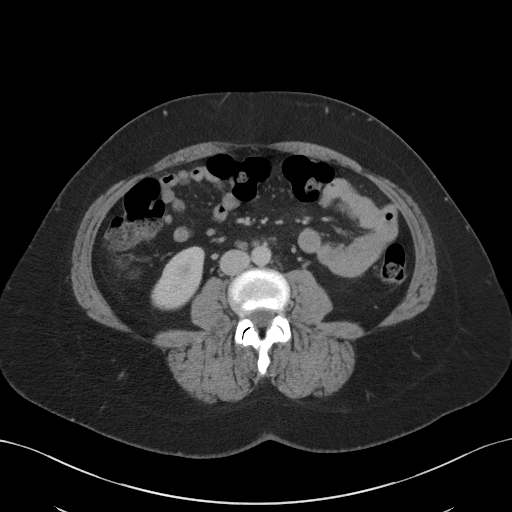
[im 56/100  soft-tissue]
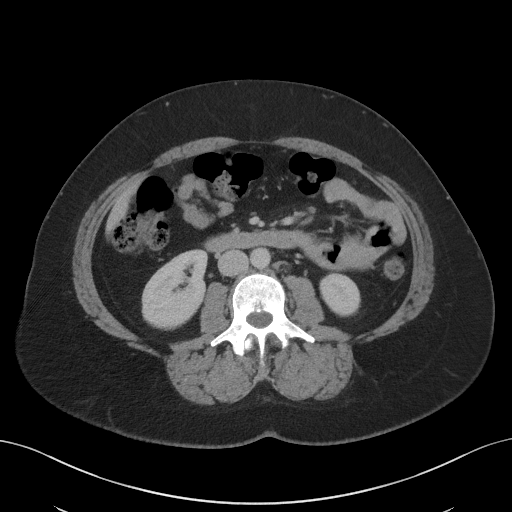
[im 65/100  soft-tissue]
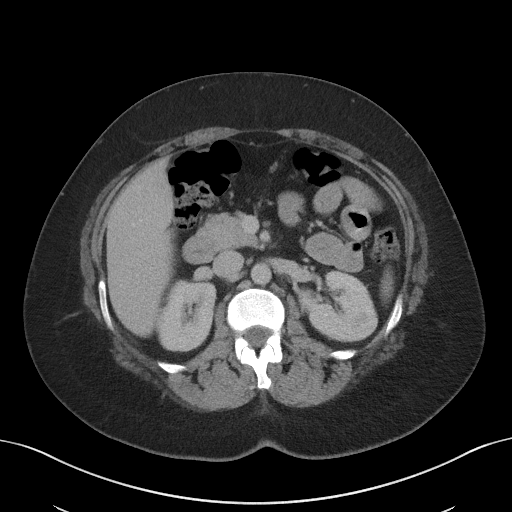
[im 65/100  bone]
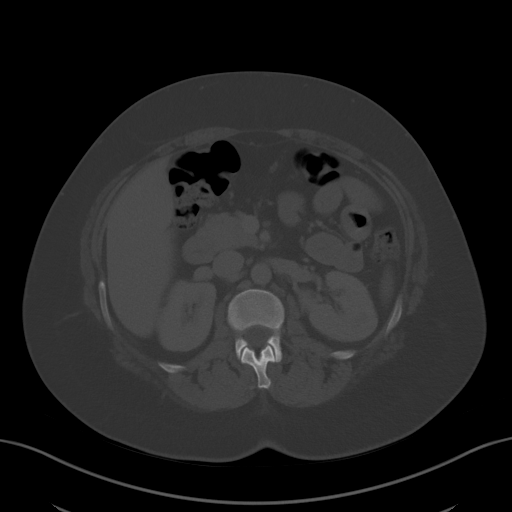
[im 74/100  soft-tissue]
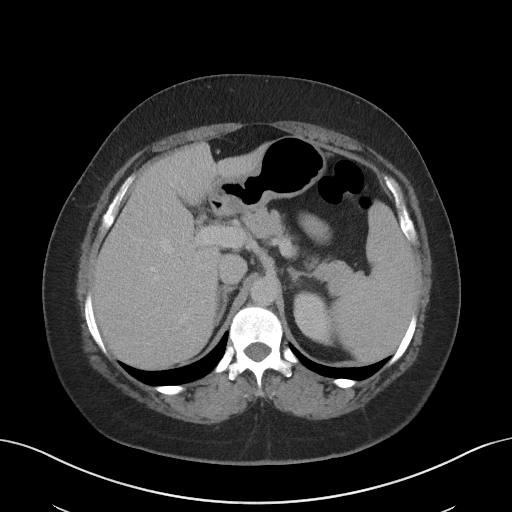
[im 78/100  soft-tissue]
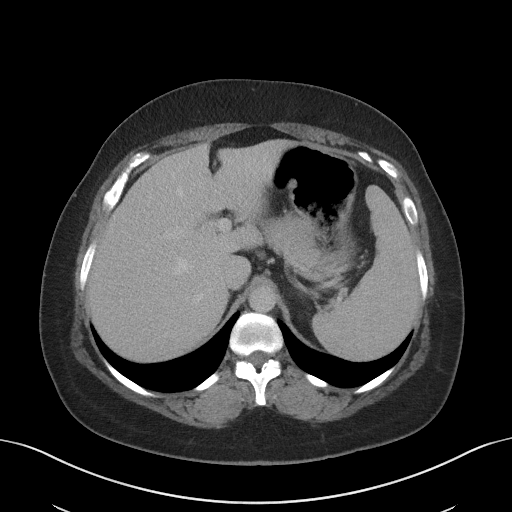
[im 87/100  soft-tissue]
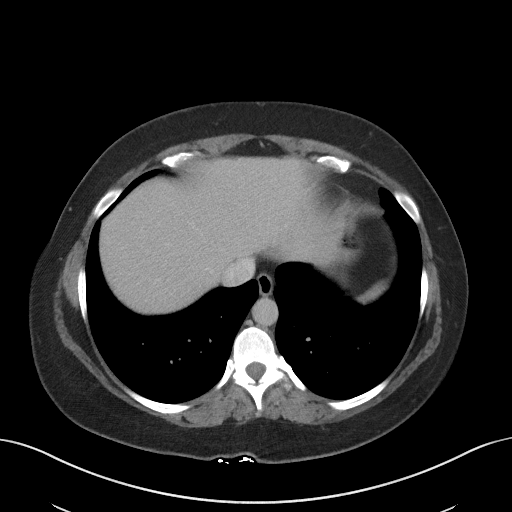
[im 95/100  soft-tissue]
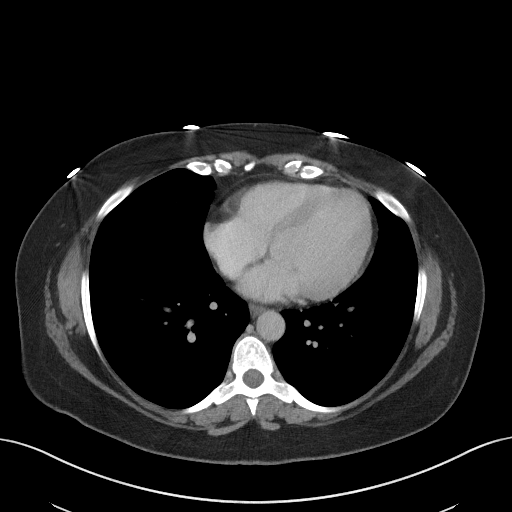

[Series 5: coronal st · coronal · 0.75mm/px · 3 of 104 slices shown]
[im 35/104  soft-tissue]
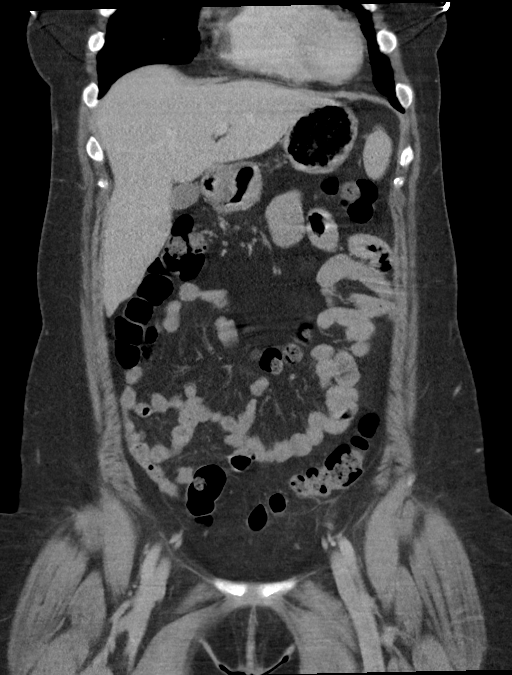
[im 46/104  soft-tissue]
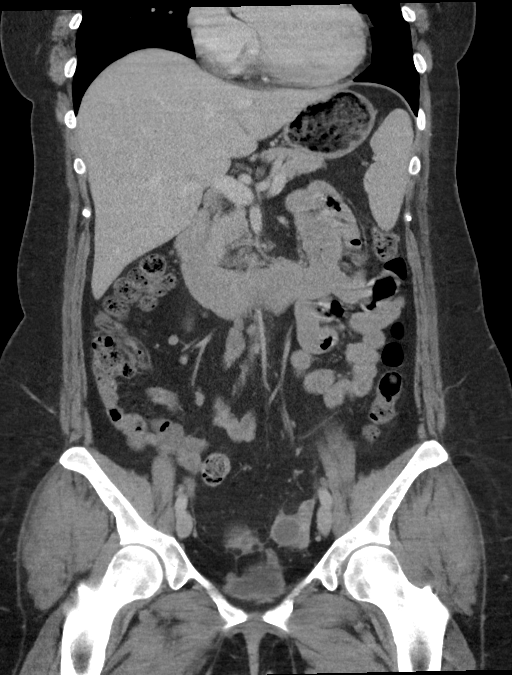
[im 58/104  soft-tissue]
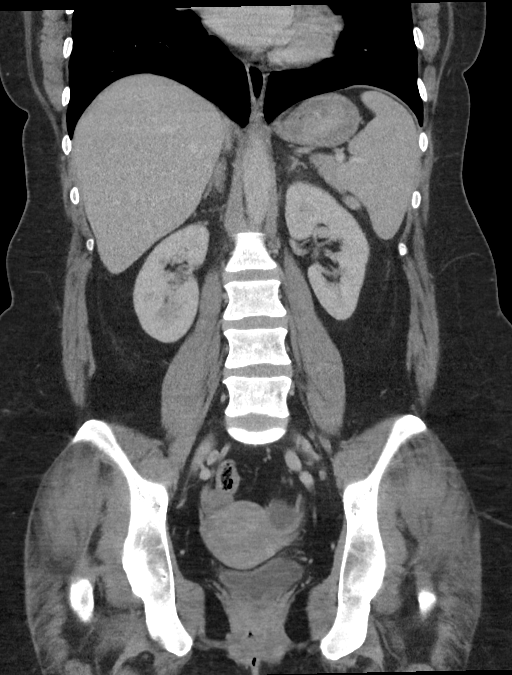

[16 of 46 positions shown; findings below may reference images not displayed]

FINDINGS: Lower chest: Lung bases are clear.

Hepatobiliary: Liver is within normal limits.

Gallbladder is unremarkable. No intrahepatic or extrahepatic ductal
dilatation.

Pancreas: Within normal limits.

Spleen: Within normal limits.

Adrenals/Urinary Tract: Adrenal glands are within normal limits.

Kidneys are within normal limits.  No hydronephrosis.

Bladder is underdistended but unremarkable.

Stomach/Bowel: Stomach is within normal limits.

No evidence of bowel obstruction.

Normal appendix (series 2/image 54).

No colonic wall thickening to suggest colitis/diverticulitis.

Vascular/Lymphatic: No evidence of abdominal aortic aneurysm.

No suspicious abdominopelvic lymphadenopathy.

Reproductive: Uterine fibroids, including a 2.2 cm subserosal left
fundal fibroid (series 2/image 36), better evaluated on ultrasound.

Bilateral ovarian cysts, measuring up to 3.5 cm on the left, likely
physiologic.

Other: No abdominopelvic ascites.

Mild left pelvic mesenteric stranding (series 2/image 70 and 78),
not clearly associated with the sigmoid colon, but possibly
involving the left ovary/adnexa.

Musculoskeletal: Visualized osseous structures are within normal
limits.
IMPRESSION: Mild left pelvic mesenteric stranding, possibly involving the left
ovary/adnexa. Although equivocal, correlate for PID.

Bilateral ovarian cysts, measuring up to 3.5 cm on the left, likely
physiologic.

Uterine fibroids, better evaluated on ultrasound.

No colonic wall thickening to suggest colitis/diverticulitis.

## 2019-08-29 ENCOUNTER — Ambulatory Visit: Payer: Federal, State, Local not specified - PPO | Attending: Internal Medicine

## 2019-08-29 DIAGNOSIS — Z20822 Contact with and (suspected) exposure to covid-19: Secondary | ICD-10-CM

## 2019-08-30 LAB — NOVEL CORONAVIRUS, NAA: SARS-CoV-2, NAA: NOT DETECTED

## 2019-09-13 IMAGING — US US ART/VEN ABD/PELV/SCROTUM DOPPLER LTD
1 series · 13 of 25 positions shown · non-contrast
Comparison: None.

CLINICAL DATA: Left pelvic pain for 1 day.

EXAM:
TRANSABDOMINAL AND TRANSVAGINAL ULTRASOUND OF PELVIS
DOPPLER ULTRASOUND OF OVARIES
TECHNIQUE: Both transabdominal and transvaginal ultrasound examinations of the
pelvis were performed. Transabdominal technique was performed for
global imaging of the pelvis including uterus, ovaries, adnexal
regions, and pelvic cul-de-sac.
It was necessary to proceed with endovaginal exam following the
transabdominal exam to visualize the endometrium and ovaries to
better advantage. Color and duplex Doppler ultrasound was utilized
to evaluate blood flow to the ovaries.

[Series 1: us art/ven abd/pelv/scrotum doppler ltd · 0.27mm/px · 13 of 75 slices shown]
[im 1/75]
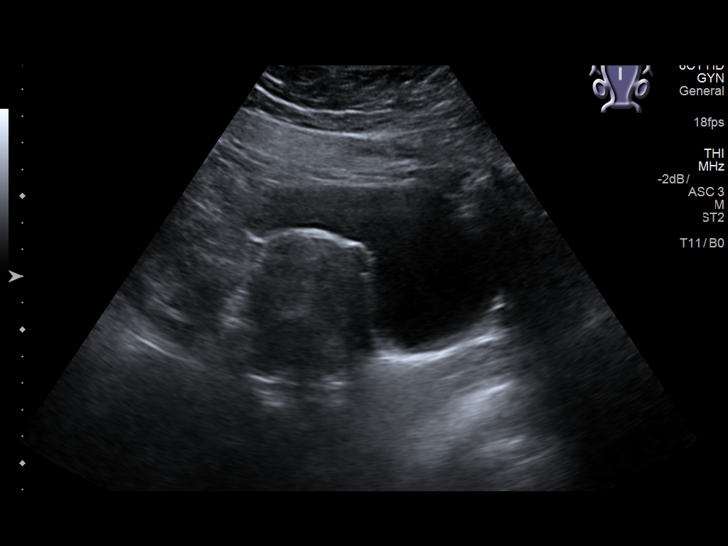
[im 7/75]
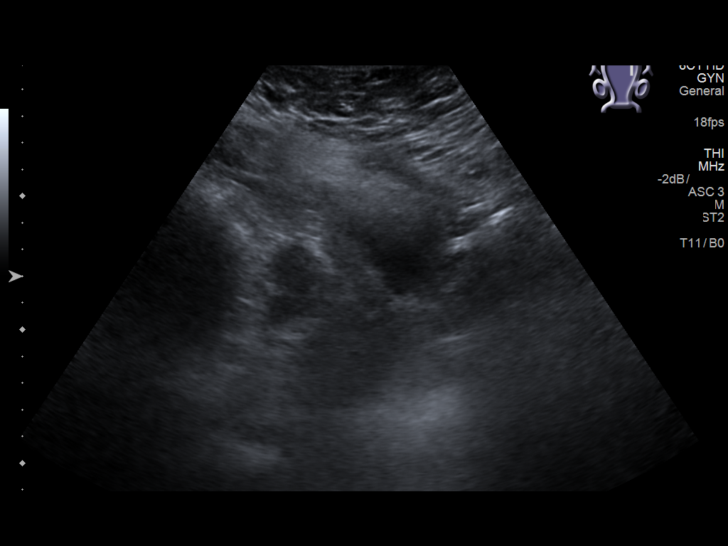
[im 13/75]
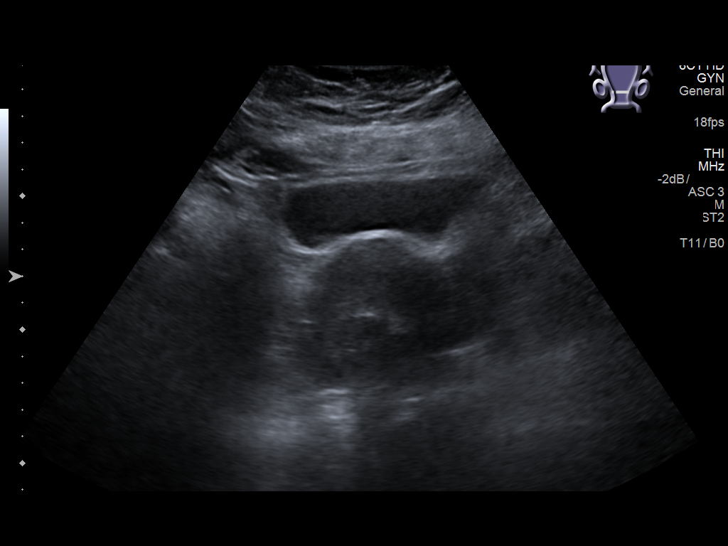
[im 19/75]
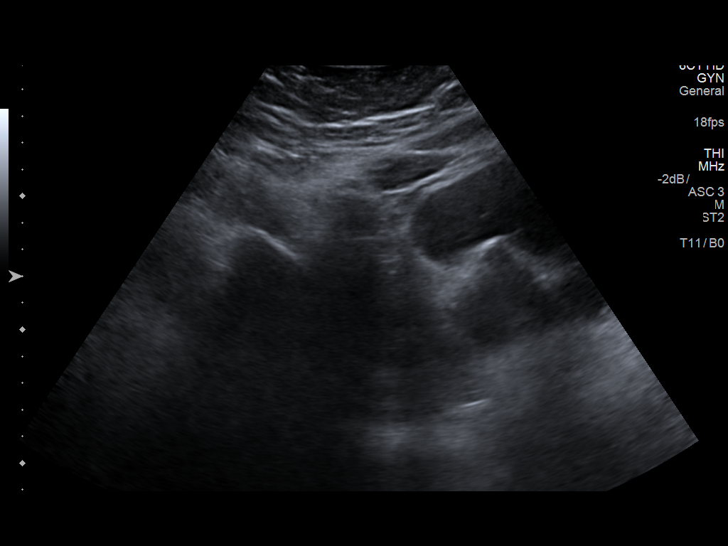
[im 25/75]
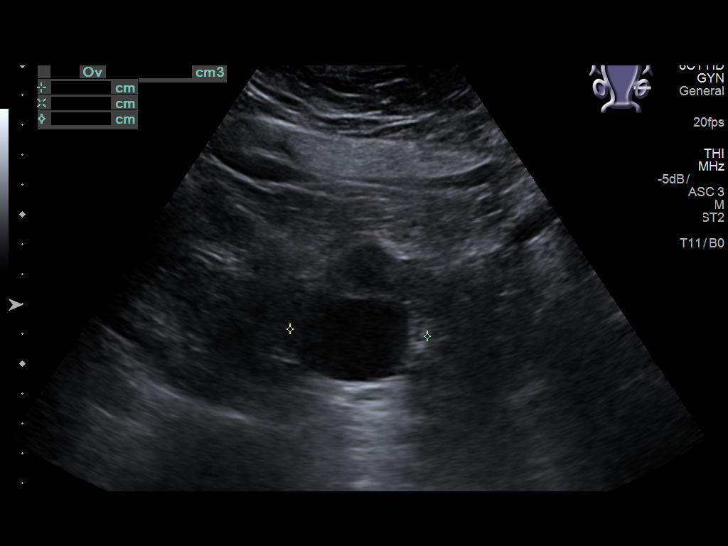
[im 31/75]
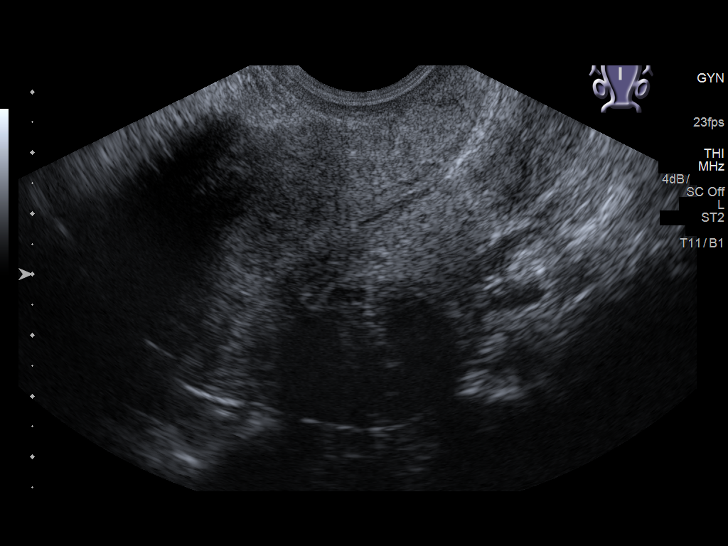
[im 38/75]
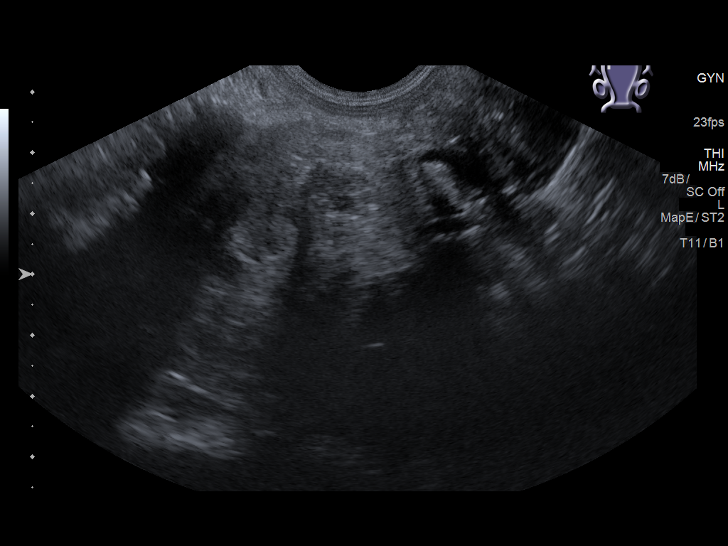
[im 44/75]
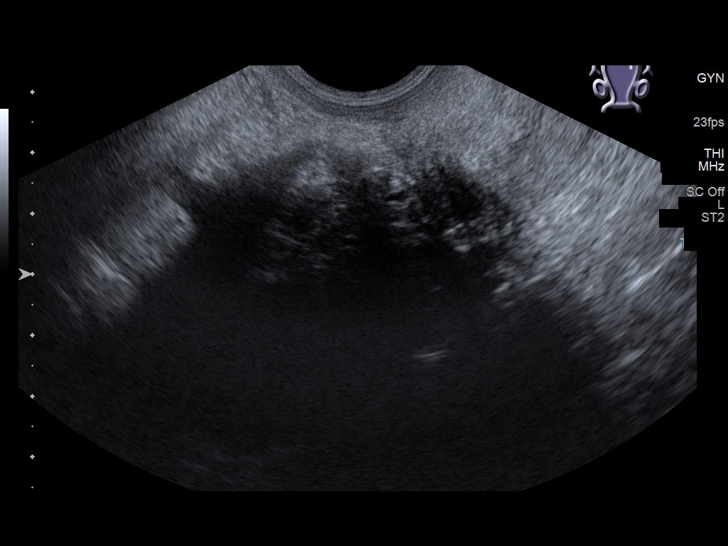
[im 50/75]
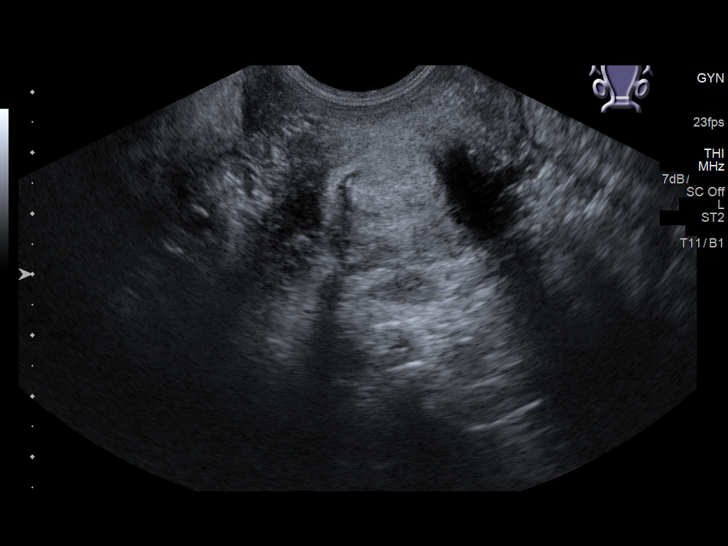
[im 56/75]
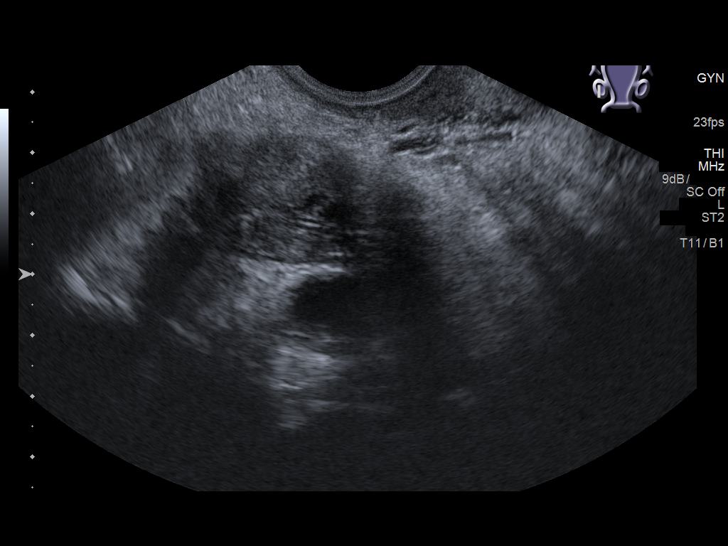
[im 62/75]
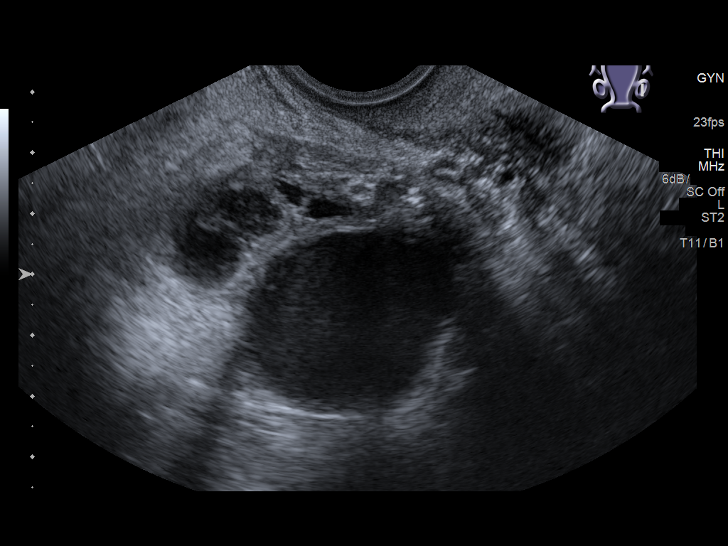
[im 68/75]
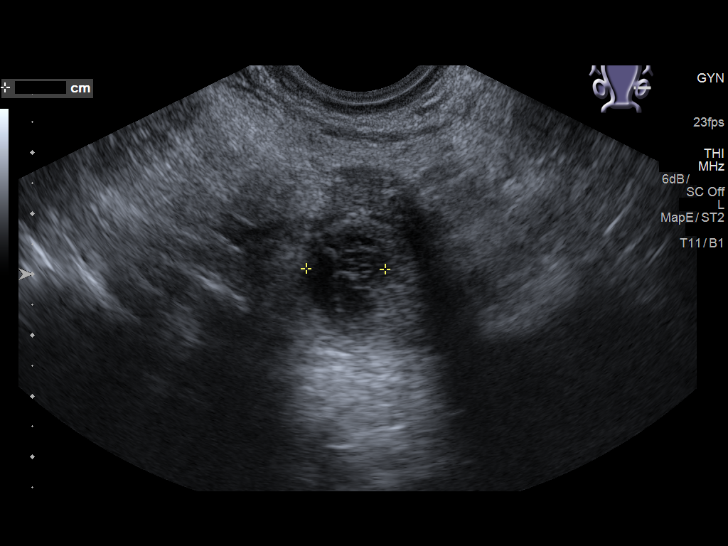
[im 75/75]
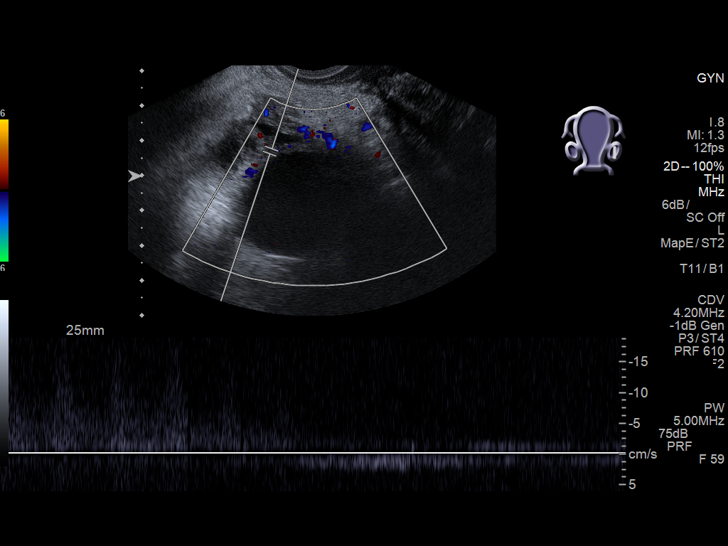

[13 of 25 positions shown; findings below may reference images not displayed]

FINDINGS: Uterus

Measurements: 9.1 x 5.1 x 4.5 cm. Three discrete fibroids are noted,
a mural fibroid from the posterior mid uterine segment measuring
x 2.5 x 2.6 cm, a mural to slightly subserosal fibroid, left mid to
upper uterine segment, measuring 12 mm and a subserosal fundal
fibroid measuring 12 mm. No other uterine masses.

Endometrium

Thickness: 8 mm.  No focal abnormality visualized.

Right ovary

Measurements: 2.4 x 1.5 x 1.8 cm. Normal appearance/no adnexal mass.

Left ovary

Measurements: 5.3 x 3.1 x 3.8 cm. Dominant cyst measuring 2.8 x
x 3.5 cm with some internal debris. There is a smaller adjacent
hemorrhagic cyst with internal septations measuring 1.1 x 1.8 x
cm. No left adnexal masses.

Pulsed Doppler evaluation of both ovaries demonstrates normal
low-resistance arterial and venous waveforms.

Other findings

No abnormal free fluid.
IMPRESSION: 1. No acute findings.
2. 3 discrete uterine fibroids.
3. Left ovarian cysts, 1 appears to be a small, 18 mm hemorrhagic
cyst. Largest cyst measures 3.5 cm. This is almost certainly benign,
and no specific imaging follow up is recommended according to the
Society of Radiologists in [id number] Consensus Conference
Statement (Dedarul Forex et al. Management of Asymptomatic Ovarian and
Other Adnexal Cysts Imaged at [place]: Society of Radiologists in
4. No other abnormalities.

## 2019-09-23 IMAGING — RF DG HYSTEROGRAM
5 series · 5 of 5 positions shown · non-contrast
Comparison: None.

CLINICAL DATA: Fertility testing.

EXAM:
HYSTEROSALPINGOGRAM
TECHNIQUE: Hysterosalpingogram was performed by the ordering physician under
fluoroscopy. Fluoroscopic images were submitted for radiologic
interpretation following the procedure. Please see the procedural
report for the amount of contrast and the fluoroscopy time utilized.

[Series 1: run · 1 of 1 slices shown (1 of 5)]
[im 1/1]
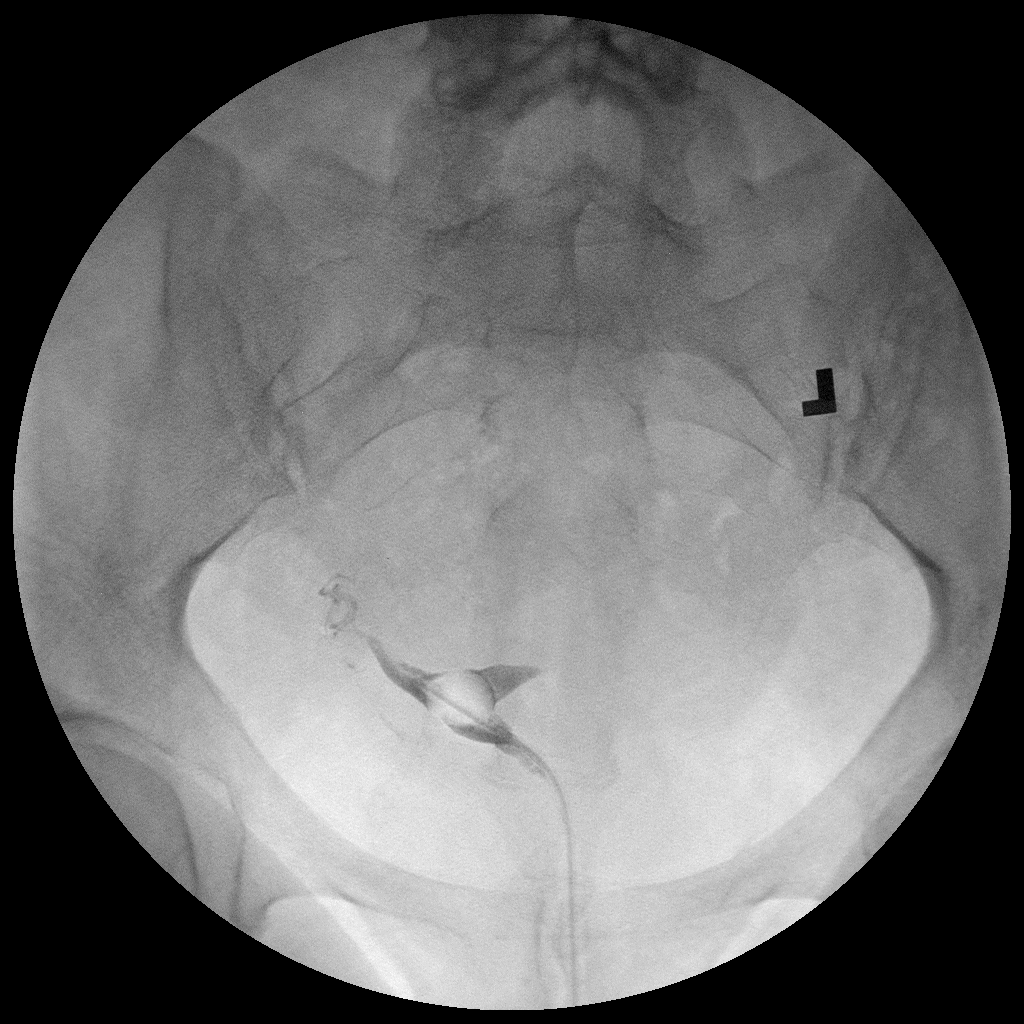

[Series 2: run · 1 of 1 slices shown (2 of 5)]
[im 1/1]
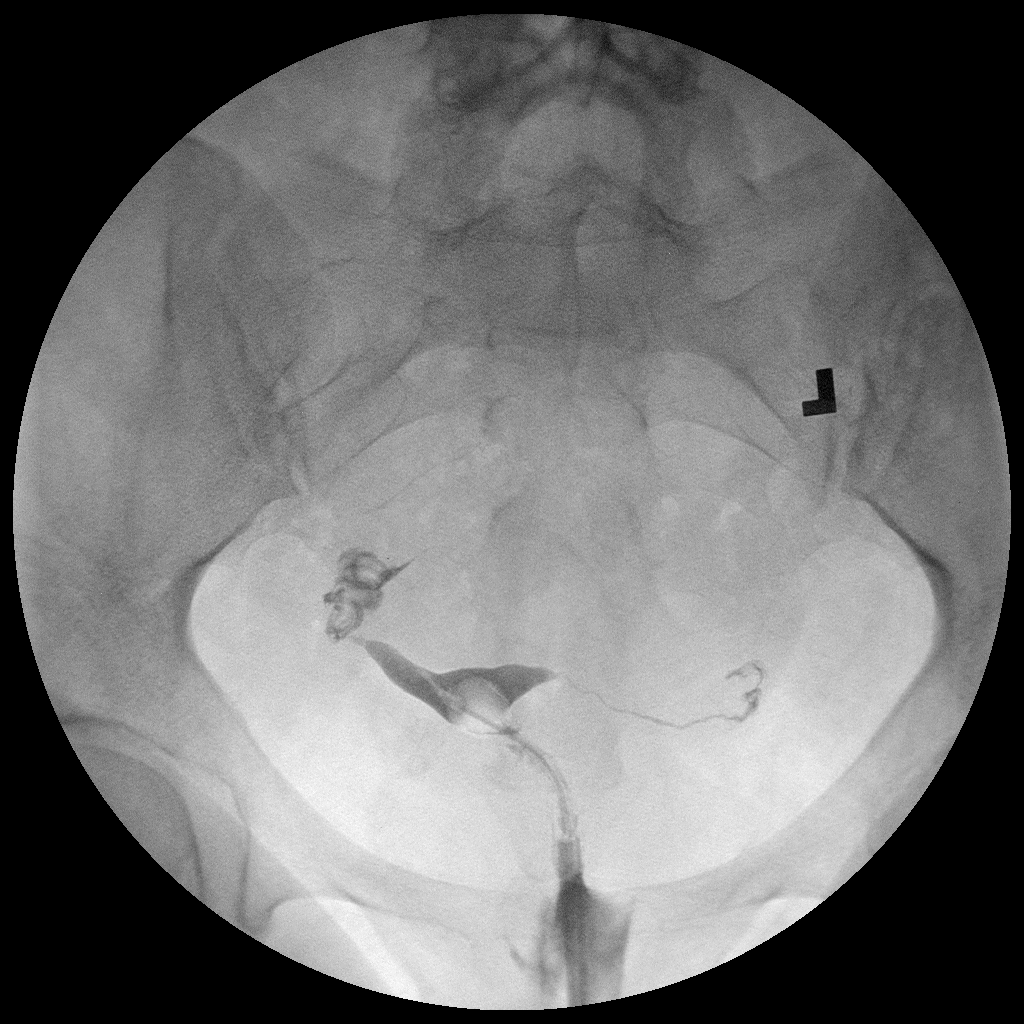

[Series 3: run · 1 of 1 slices shown (3 of 5)]
[im 1/1]
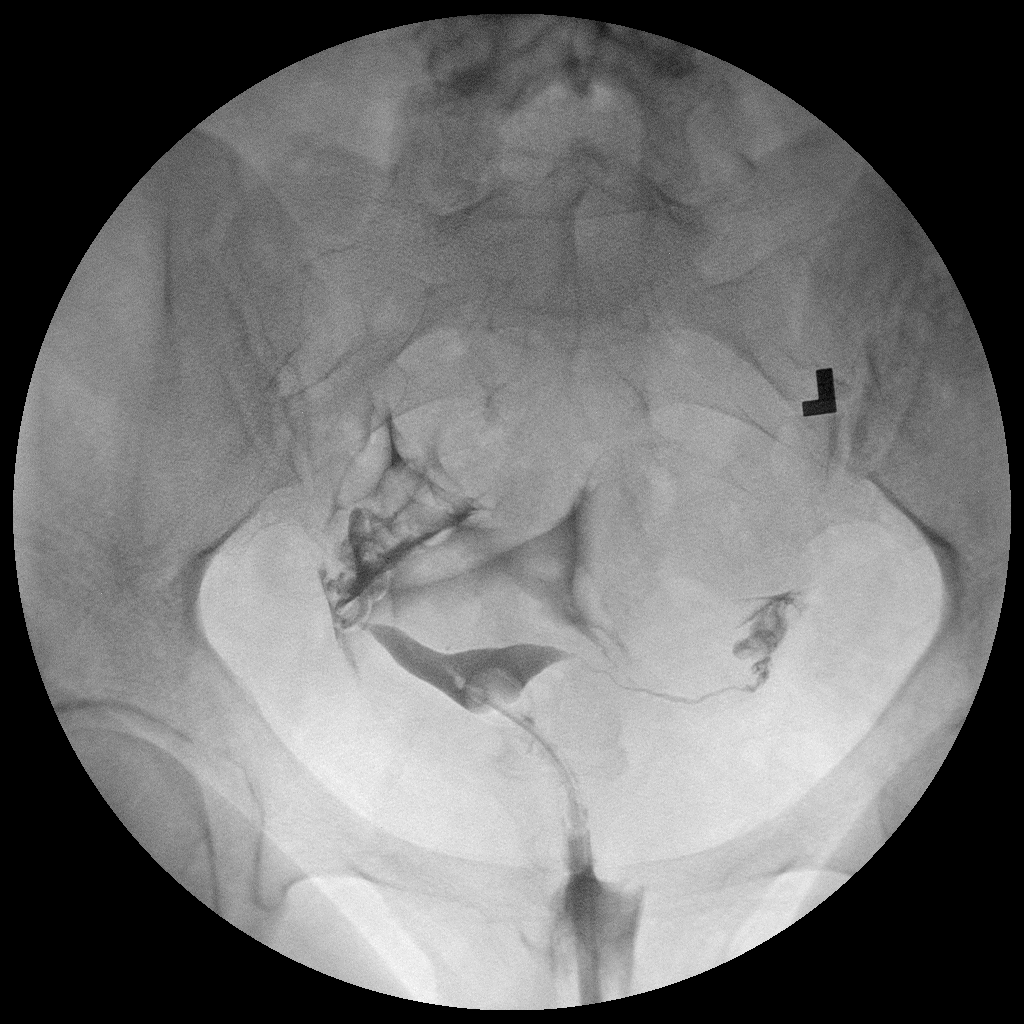

[Series 4: run · 1 of 1 slices shown (4 of 5)]
[im 1/1]
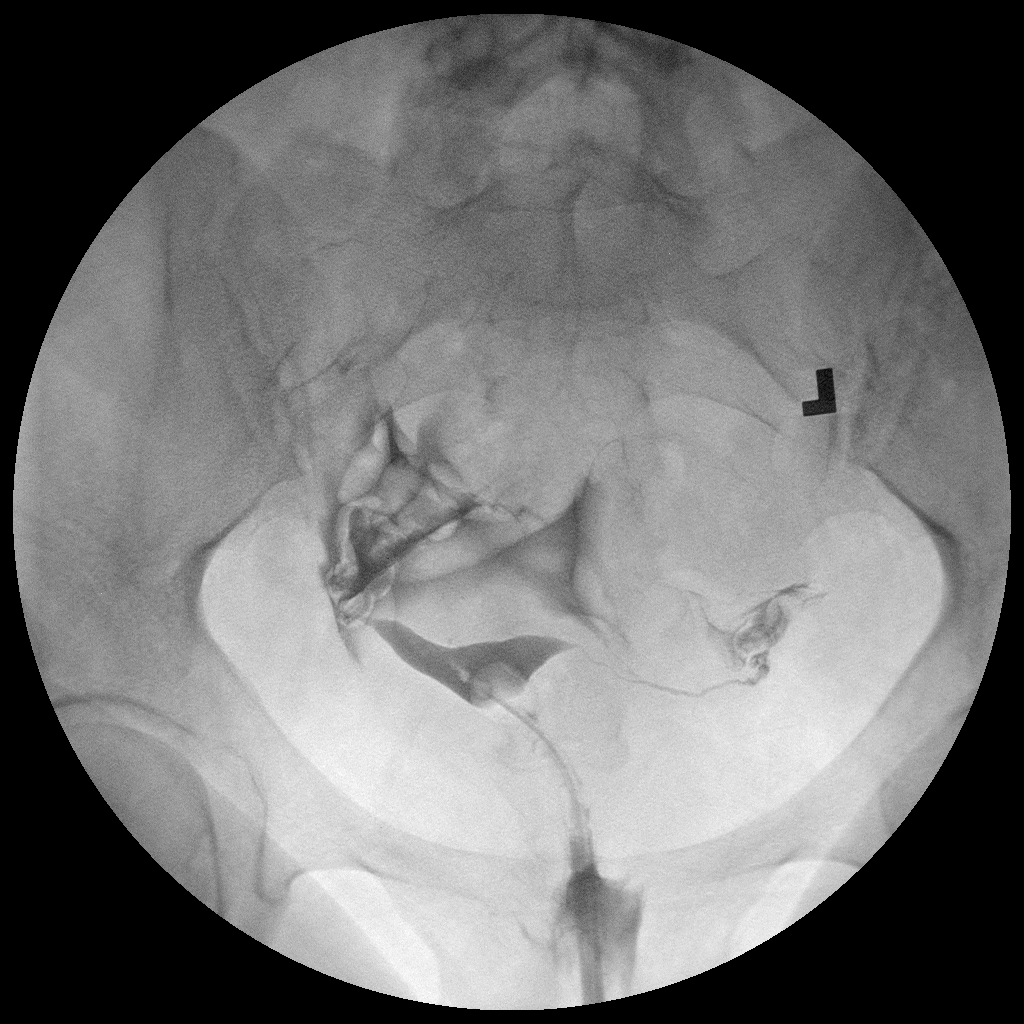

[Series 5: run · 1 of 1 slices shown (5 of 5)]
[im 1/1]
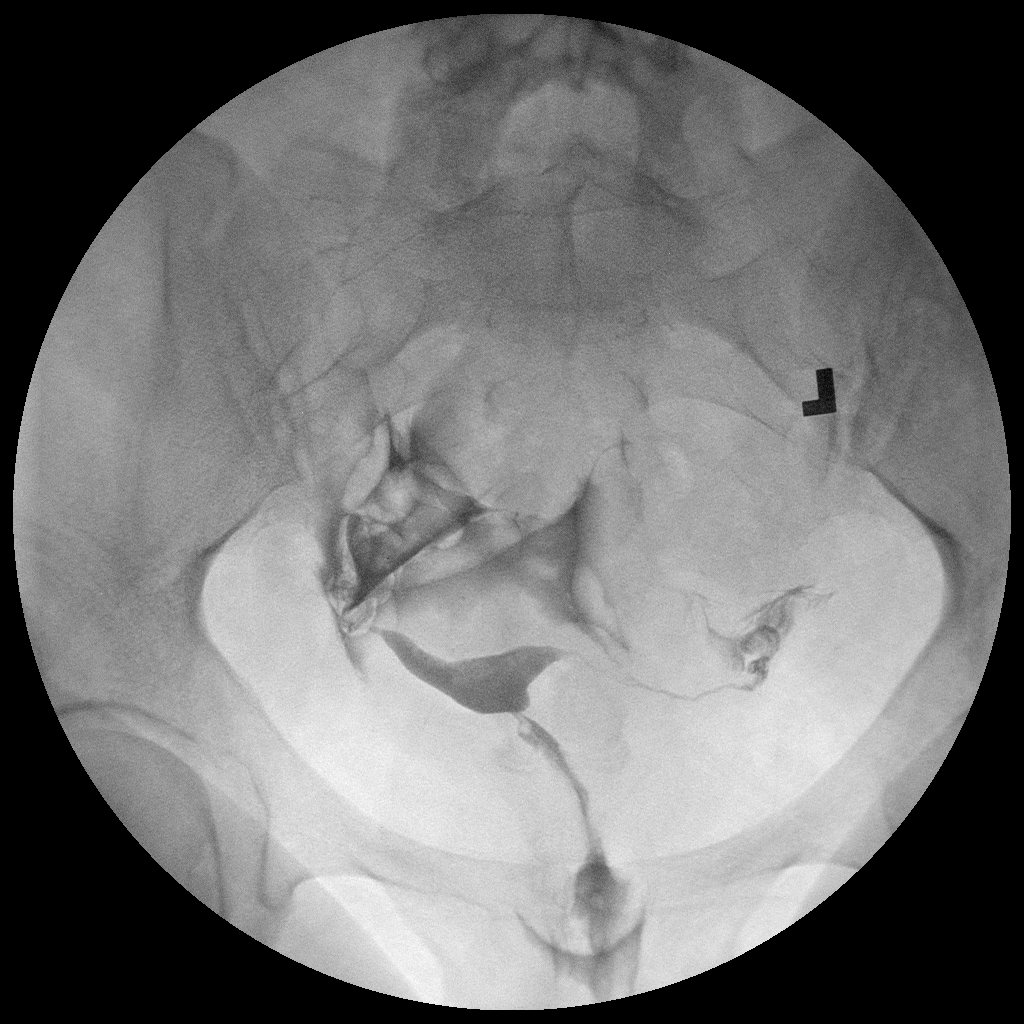

[5 of 5 positions shown; findings below may reference images not displayed]

FINDINGS: Endometrial Cavity: Normal appearance. No signs of Mullerian duct
anomaly or other significant abnormality.

Right Fallopian Tube: Normal appearance. Free intraperitoneal spill
of contrast is demonstrated.

Left Fallopian Tube: Normal appearance. Free intraperitoneal spill
of contrast is demonstrated.

Other:  None.
IMPRESSION: Normal study. Both fallopian tubes are patent.

## 2019-11-21 ENCOUNTER — Other Ambulatory Visit (HOSPITAL_COMMUNITY)
Admission: RE | Admit: 2019-11-21 | Discharge: 2019-11-21 | Disposition: A | Discharge status: Home / Self Care | Payer: Federal, State, Local not specified - PPO | Source: Ambulatory Visit | Attending: Obstetrics and Gynecology | Admitting: Obstetrics and Gynecology

## 2019-11-21 ENCOUNTER — Encounter (HOSPITAL_BASED_OUTPATIENT_CLINIC_OR_DEPARTMENT_OTHER): Payer: Self-pay | Admitting: Obstetrics and Gynecology

## 2019-11-21 ENCOUNTER — Other Ambulatory Visit: Payer: Self-pay

## 2019-11-21 DIAGNOSIS — Z01812 Encounter for preprocedural laboratory examination: Secondary | ICD-10-CM | POA: Diagnosis present

## 2019-11-21 DIAGNOSIS — Z20822 Contact with and (suspected) exposure to covid-19: Secondary | ICD-10-CM | POA: Diagnosis not present

## 2019-11-21 LAB — SARS CORONAVIRUS 2 (TAT 6-24 HRS): SARS Coronavirus 2: NEGATIVE

## 2019-11-21 NOTE — H&P (Addendum)
ADALEE ABDO is a 39 y.o. female presenting for surgical mngt of recurrent missed Ab.  No pelvic pain, cramping or vb.  . OB History    Gravida  1   Para      Term      Preterm      AB      Living        SAB      TAB      Ectopic      Multiple      Live Births             Past Medical History:  Diagnosis Date  . Anemia   . Endometriosis   . Fibroid, uterine   . Headache   . History of Graves' disease   . Hypothyroidism   . Ovarian cyst   . PCOS (polycystic ovarian syndrome)   . PONV (postoperative nausea and vomiting)    NAUSEA  . Thyroid disease    Past Surgical History:  Procedure Laterality Date  . OVARIAN CYST REMOVAL    . THYROIDECTOMY  2006   TOTAL DONE FOR GRAVES DISEASE  . TONSILLECTOMY  AS CHILD   Family History: family history is not on file. Social History:  reports that she has never smoked. She has never used smokeless tobacco. She reports that she does not drink alcohol or use drugs.       History   Height 5\' 5"  (1.651 m), weight 102.1 kg, last menstrual period 09/23/2019, unknown if currently breastfeeding. Exam Physical Exam  Gen - NAD Abd - soft, NT/ND CV - RRR Lungs - clear Ext - NT, no edema  Prenatal labs: ABO, Rh:  pending Antibody:     PV [place]:  IUP 6wk w/ no FHT A+ blood type  Assessment/Plan:  Missed Ab D&E with chromosome studies ABO Rh pending - rhogam if Rh negative R/b/a discussed and informed consent   Marylynn Pearson 11/21/2019, 3:44 PM

## 2019-11-21 NOTE — Progress Notes (Signed)
Spoke w/ via phone for pre-op interview---Leoni Lab needs dos----none             COVID test ------11-21-2019 at 315 pm Arrive at -------530 am 11-22-2019 NPO after ------midnight Medications to take morning of surgery -----armour thryoid Diabetic medication -----n/a Patient Special Instructions -----none Pre-Op special Istructions -----none Patient verbalized understanding of instructions that were given at this phone interview. Patient denies shortness of breath, chest pain, fever, cough a this phone interview.

## 2019-11-21 NOTE — Anesthesia Preprocedure Evaluation (Addendum)
Anesthesia Evaluation  Patient identified by MRN, date of birth, ID band Patient awake    Reviewed: Allergy & Precautions, NPO status , Patient's Chart, lab work & pertinent test results  History of Anesthesia Complications (+) PONVNegative for: history of anesthetic complications  Airway Mallampati: II  TM Distance: >3 FB Neck ROM: Full    Dental  (+) Teeth Intact, Dental Advisory Given   Pulmonary neg pulmonary ROS,    Pulmonary exam normal breath sounds clear to auscultation       Cardiovascular negative cardio ROS Normal cardiovascular exam Rhythm:Regular Rate:Normal     Neuro/Psych  Headaches,    GI/Hepatic negative GI ROS, Neg liver ROS,   Endo/Other  Hypothyroidism Obesity   Renal/GU negative Renal ROS     Musculoskeletal negative musculoskeletal ROS (+)   Abdominal   Peds  Hematology  (+) Blood dyscrasia, anemia ,   Anesthesia Other Findings Day of surgery medications reviewed with the patient.  Reproductive/Obstetrics Missed abortion                            Anesthesia Physical Anesthesia Plan  ASA: II  Anesthesia Plan: General   Post-op Pain Management:    Induction: Intravenous  PONV Risk Score and Plan: 4 or greater and Diphenhydramine, Scopolamine patch - Pre-op, Midazolam, Dexamethasone and Ondansetron  Airway Management Planned: LMA  Additional Equipment:   Intra-op Plan:   Post-operative Plan: Extubation in OR  Informed Consent: I have reviewed the patients History and Physical, chart, labs and discussed the procedure including the risks, benefits and alternatives for the proposed anesthesia with the patient or authorized representative who has indicated his/her understanding and acceptance.     Dental advisory given  Plan Discussed with: CRNA  Anesthesia Plan Comments:        Anesthesia Quick Evaluation

## 2019-11-22 ENCOUNTER — Ambulatory Visit (HOSPITAL_BASED_OUTPATIENT_CLINIC_OR_DEPARTMENT_OTHER)
Admission: RE | Admit: 2019-11-22 | Discharge: 2019-11-22 | Disposition: A | Discharge status: Home / Self Care | Payer: Federal, State, Local not specified - PPO | Source: Ambulatory Visit | Attending: Obstetrics and Gynecology | Admitting: Obstetrics and Gynecology

## 2019-11-22 ENCOUNTER — Ambulatory Visit (HOSPITAL_BASED_OUTPATIENT_CLINIC_OR_DEPARTMENT_OTHER): Payer: Federal, State, Local not specified - PPO | Admitting: Anesthesiology

## 2019-11-22 ENCOUNTER — Encounter (HOSPITAL_BASED_OUTPATIENT_CLINIC_OR_DEPARTMENT_OTHER): Payer: Self-pay | Admitting: Obstetrics and Gynecology

## 2019-11-22 ENCOUNTER — Other Ambulatory Visit: Payer: Self-pay

## 2019-11-22 ENCOUNTER — Encounter (HOSPITAL_BASED_OUTPATIENT_CLINIC_OR_DEPARTMENT_OTHER)
Admission: RE | Disposition: A | Discharge status: Home / Self Care | Payer: Self-pay | Source: Ambulatory Visit | Attending: Obstetrics and Gynecology

## 2019-11-22 DIAGNOSIS — E282 Polycystic ovarian syndrome: Secondary | ICD-10-CM | POA: Diagnosis not present

## 2019-11-22 DIAGNOSIS — E039 Hypothyroidism, unspecified: Secondary | ICD-10-CM | POA: Diagnosis not present

## 2019-11-22 DIAGNOSIS — O021 Missed abortion: Secondary | ICD-10-CM | POA: Diagnosis present

## 2019-11-22 HISTORY — DX: Hypothyroidism, unspecified: E03.9

## 2019-11-22 HISTORY — DX: Anemia, unspecified: D64.9

## 2019-11-22 HISTORY — DX: Personal history of other endocrine, nutritional and metabolic disease: Z86.39

## 2019-11-22 HISTORY — DX: Other specified postprocedural states: Z98.890

## 2019-11-22 HISTORY — PX: DILATION AND EVACUATION: SHX1459

## 2019-11-22 HISTORY — DX: Nausea with vomiting, unspecified: R11.2

## 2019-11-22 HISTORY — DX: Headache, unspecified: R51.9

## 2019-11-22 LAB — ABO/RH: ABO/RH(D): A POS

## 2019-11-22 LAB — TYPE AND SCREEN
ABO/RH(D): A POS
Antibody Screen: NEGATIVE

## 2019-11-22 SURGERY — DILATION AND EVACUATION, UTERUS
Anesthesia: General | Site: Vagina

## 2019-11-22 MED ORDER — ONDANSETRON HCL 4 MG/2ML IJ SOLN
INTRAMUSCULAR | Status: DC | PRN
  Administered 2019-11-22: 4 mg via INTRAVENOUS

## 2019-11-22 MED ORDER — MIDAZOLAM HCL 2 MG/2ML IJ SOLN
INTRAMUSCULAR | Status: DC | PRN
  Administered 2019-11-22: 2 mg via INTRAVENOUS

## 2019-11-22 MED ORDER — DEXAMETHASONE SODIUM PHOSPHATE 10 MG/ML IJ SOLN
INTRAMUSCULAR | Status: DC | PRN
  Administered 2019-11-22: 10 mg via INTRAVENOUS

## 2019-11-22 MED ORDER — IBUPROFEN 600 MG PO TABS: 600.0000 mg | ORAL_TABLET | Freq: Four times a day (QID) | ORAL | 1 refills | Status: DC | PRN

## 2019-11-22 MED ORDER — FENTANYL CITRATE (PF) 100 MCG/2ML IJ SOLN
25.0000 ug | INTRAMUSCULAR | Status: DC | PRN
  Administered 2019-11-22: 08:00:00 50 ug via INTRAVENOUS
  Filled 2019-11-22: qty 1

## 2019-11-22 MED ORDER — SILVER NITRATE-POT NITRATE 75-25 % EX MISC
CUTANEOUS | Status: DC | PRN
  Administered 2019-11-22: 1

## 2019-11-22 MED ORDER — OXYCODONE-ACETAMINOPHEN 5-325 MG PO TABS: 1.0000 | ORAL_TABLET | Freq: Four times a day (QID) | ORAL | 0 refills | Status: DC | PRN

## 2019-11-22 MED ORDER — ACETAMINOPHEN 500 MG PO TABS
ORAL_TABLET | ORAL | Status: AC
  Filled 2019-11-22: qty 2

## 2019-11-22 MED ORDER — KETOROLAC TROMETHAMINE 30 MG/ML IJ SOLN
INTRAMUSCULAR | Status: DC | PRN
  Administered 2019-11-22: 15 mg via INTRAVENOUS

## 2019-11-22 MED ORDER — LIDOCAINE 2% (20 MG/ML) 5 ML SYRINGE
INTRAMUSCULAR | Status: AC
  Filled 2019-11-22: qty 5

## 2019-11-22 MED ORDER — ONDANSETRON HCL 4 MG/2ML IJ SOLN
INTRAMUSCULAR | Status: AC
  Filled 2019-11-22: qty 2

## 2019-11-22 MED ORDER — SODIUM CHLORIDE 0.9 % IV SOLN
INTRAVENOUS | Status: AC
  Filled 2019-11-22: qty 2

## 2019-11-22 MED ORDER — FENTANYL CITRATE (PF) 100 MCG/2ML IJ SOLN
INTRAMUSCULAR | Status: DC | PRN
  Administered 2019-11-22 (×2): 50 ug via INTRAVENOUS

## 2019-11-22 MED ORDER — DEXAMETHASONE SODIUM PHOSPHATE 10 MG/ML IJ SOLN
INTRAMUSCULAR | Status: AC
  Filled 2019-11-22: qty 1

## 2019-11-22 MED ORDER — MIDAZOLAM HCL 2 MG/2ML IJ SOLN
INTRAMUSCULAR | Status: AC
  Filled 2019-11-22: qty 2

## 2019-11-22 MED ORDER — LACTATED RINGERS IV SOLN
INTRAVENOUS | Status: DC
  Filled 2019-11-22: qty 1000

## 2019-11-22 MED ORDER — LIDOCAINE HCL 1 % IJ SOLN
INTRAMUSCULAR | Status: DC | PRN
  Administered 2019-11-22: 10 mL

## 2019-11-22 MED ORDER — METHYLERGONOVINE MALEATE 0.2 MG PO TABS: 0.2000 mg | ORAL_TABLET | Freq: Three times a day (TID) | ORAL | 0 refills | Status: DC

## 2019-11-22 MED ORDER — PROMETHAZINE HCL 25 MG/ML IJ SOLN
6.2500 mg | INTRAMUSCULAR | Status: DC | PRN
  Filled 2019-11-22: qty 1

## 2019-11-22 MED ORDER — PROPOFOL 10 MG/ML IV BOLUS
INTRAVENOUS | Status: AC
  Filled 2019-11-22: qty 20

## 2019-11-22 MED ORDER — FENTANYL CITRATE (PF) 100 MCG/2ML IJ SOLN
INTRAMUSCULAR | Status: AC
  Filled 2019-11-22: qty 2

## 2019-11-22 MED ORDER — ACETAMINOPHEN 500 MG PO TABS
1000.0000 mg | ORAL_TABLET | Freq: Once | ORAL | Status: AC
Start: 2019-11-22 — End: 2019-11-22
  Administered 2019-11-22: 1000 mg via ORAL
  Filled 2019-11-22: qty 2

## 2019-11-22 MED ORDER — SCOPOLAMINE 1 MG/3DAYS TD PT72
MEDICATED_PATCH | TRANSDERMAL | Status: AC
  Filled 2019-11-22: qty 1

## 2019-11-22 MED ORDER — PROPOFOL 500 MG/50ML IV EMUL
INTRAVENOUS | Status: DC | PRN
  Administered 2019-11-22: 170 mg via INTRAVENOUS

## 2019-11-22 MED ORDER — SODIUM CHLORIDE 0.9 % IV SOLN
2.0000 g | INTRAVENOUS | Status: AC
  Administered 2019-11-22: 2 g via INTRAVENOUS
  Filled 2019-11-22: qty 2

## 2019-11-22 MED ORDER — SCOPOLAMINE 1 MG/3DAYS TD PT72
1.0000 | MEDICATED_PATCH | Freq: Once | TRANSDERMAL | Status: DC
  Administered 2019-11-22: 1.5 mg via TRANSDERMAL
  Filled 2019-11-22: qty 1

## 2019-11-22 MED ORDER — LIDOCAINE 2% (20 MG/ML) 5 ML SYRINGE
INTRAMUSCULAR | Status: DC | PRN
  Administered 2019-11-22: 100 mg via INTRAVENOUS

## 2019-11-22 SURGICAL SUPPLY — 22 items
CATH ROBINSON RED A/P 16FR (CATHETERS) ×3 IMPLANT
COVER WAND RF STERILE (DRAPES) ×3 IMPLANT
DILATOR CANAL MILEX (MISCELLANEOUS) IMPLANT
FILTER UTR ASPR ASSEMBLY (MISCELLANEOUS) ×3 IMPLANT
GLOVE BIO SURGEON STRL SZ 6.5 (GLOVE) ×2 IMPLANT
GLOVE BIO SURGEONS STRL SZ 6.5 (GLOVE) ×1
GOWN STRL REUS W/TWL LRG LVL3 (GOWN DISPOSABLE) ×3 IMPLANT
HOSE CONNECTING 18IN BERKELEY (TUBING) ×3 IMPLANT
KIT BERKELEY 1ST TRI 3/8 NO TR (MISCELLANEOUS) ×3 IMPLANT
KIT BERKELEY 1ST TRIMESTER 3/8 (MISCELLANEOUS) ×6 IMPLANT
KIT TURNOVER CYSTO (KITS) ×3 IMPLANT
NS IRRIG 500ML POUR BTL (IV SOLUTION) IMPLANT
PACK VAGINAL MINOR WOMEN LF (CUSTOM PROCEDURE TRAY) ×3 IMPLANT
PAD OB MATERNITY 4.3X12.25 (PERSONAL CARE ITEMS) ×3 IMPLANT
PAD PREP 24X48 CUFFED NSTRL (MISCELLANEOUS) ×3 IMPLANT
SET BERKELEY SUCTION TUBING (SUCTIONS) ×3 IMPLANT
TOWEL OR 17X26 10 PK STRL BLUE (TOWEL DISPOSABLE) ×3 IMPLANT
TRAP TISSUE FILTER (MISCELLANEOUS) ×4 IMPLANT
VACURETTE 7MM CVD STRL WRAP (CANNULA) ×2 IMPLANT
VACURETTE 8 RIGID CVD (CANNULA) IMPLANT
VACURETTE 9 RIGID CVD (CANNULA) IMPLANT
WATER STERILE IRR 500ML POUR (IV SOLUTION) ×3 IMPLANT

## 2019-11-22 NOTE — Op Note (Signed)
  Pre op:  Missed Ab, recurrent Post op dx:  Same Procedure:  Paracervical block, D&E Surgeon:  Marylynn Pearson, MD Specimen:  POC for chromosome studies and pathology review Anesthesia:  general      R/B/A of procedure were d/w patient and informed consent obtained.  Pt was taken to the OR w/ IV running and placed in supine position.  MAC anesthesia was obtained and she was placed in dorsal lithotomy w/ allen stirrups.  Prepped and draped with sterile procedure and in and out catheter used to drain her bladder.  Bivalved speculum was placed in vagina and 1cc of 1% lidocaine injected at 12 oclock on the cervix.  Single tooth tenaculum was attached to the anterior lip of the cervix and a cervical block was performed with 9cc.  The cervix was serially dilated with pratt dilators.  7 french suction catheter was used and POC removed from uterus.  A gentle curetting was then performed until uterine cry was noted.  Suction catheter reinserted to remove clots and no more tissue noted.  Tenaculum removed and cervix was hemostatic.  Speculum removed.  All counts were correct.

## 2019-11-22 NOTE — Anesthesia Postprocedure Evaluation (Signed)
Anesthesia Post Note  Patient: Meghan Leach  Procedure(s) Performed: DILATATION AND EVACUATION (N/A Vagina ) CHROMOSOME STUDIES (N/A Vagina )     Patient location during evaluation: PACU Anesthesia Type: General Level of consciousness: awake and alert Pain management: pain level controlled Vital Signs Assessment: post-procedure vital signs reviewed and stable Respiratory status: spontaneous breathing, nonlabored ventilation and respiratory function stable Cardiovascular status: blood pressure returned to baseline and stable Postop Assessment: no apparent nausea or vomiting Anesthetic complications: no    Last Vitals:  Vitals:   11/22/19 0845 11/22/19 0920  BP: 132/86 (!) 146/96  Pulse: 71 75  Resp: 12 14  Temp: 36.7 C 36.7 C  SpO2: 100% 100%    Last Pain:  Vitals:   11/22/19 0920  TempSrc:   PainSc: 3                  Catalina Gravel

## 2019-11-22 NOTE — Transfer of Care (Signed)
Immediate Anesthesia Transfer of Care Note  Patient: Meghan Leach  Procedure(s) Performed: Procedure(s): DILATATION AND EVACUATION (N/A) CHROMOSOME STUDIES (N/A)  Patient Location: PACU  Anesthesia Type:General  Level of Consciousness: Alert, Awake, Oriented  Airway & Oxygen Therapy: Patient Spontanous Breathing  Post-op Assessment: Report given to RN  Post vital signs: Reviewed and stable  Last Vitals:  Vitals:   11/22/19 0710  BP: (!) 142/88  Pulse: 99  Resp: 20  Temp: 36.4 C  SpO2: [id number]    Complications: No apparent anesthesia complications

## 2019-11-22 NOTE — Discharge Instructions (Signed)
[facility] office 2-3 weeks for postop appointment.  Call the office [phone] for an appointment.  Personal Hygiene: Use pads not tampons x 1week You may shower, no tub baths or pools for 2-3 weeks Wipe from front to back when using restroom  Activity: Do not drive or operate any equipment for 24 hrs.   Do not rest in bed all day Walking is encouraged Walk up and down stairs slowly You may return to your normal activity in 1-2 days  Sexual Activity:  No intercourse for 2 weeks after the procedure.  Diet: Eat a light meal as desired this evening.  You may resume your usual diet tomorrow.  Return to work:  You may resume your work activities after 2-3 days  What to expect:  Expect to have vaginal bleeding/discharge for 3-5 days and spotting for 10-14 days.  It is not unusual to have soreness for 1-2 weeks.  You may have a slight burning sensation when you urinate for the first few days.  You may start your menses in 2-6 weeks.  Mild cramps may continue for a couple of days.    Call your doctor:   Excessive bleeding, saturating a pad every hour Inability to urinate 6 hours after discharge Pain not relieved with pain medications Fever of 100.4 or greater   Post Anesthesia Home Care Instructions  Activity: Get plenty of rest for the remainder of the day. A responsible individual must stay with you for 24 hours following the procedure.  For the next 24 hours, DO NOT: -Drive a car -Paediatric nurse -Drink alcoholic beverages -Take any medication unless instructed by your physician -Make any legal decisions or sign important papers.  Meals: Start with liquid foods such as gelatin or soup. Progress to regular foods as tolerated. Avoid greasy, spicy, heavy foods. If nausea and/or vomiting occur, drink only clear liquids until the nausea and/or vomiting subsides. Call your physician if vomiting continues.  Special Instructions/Symptoms: Your throat may feel dry or sore from the anesthesia  or the breathing tube placed in your throat during surgery. If this causes discomfort, gargle with warm salt water. The discomfort should disappear within 24 hours.  If you had a scopolamine patch placed behind your ear for the management of post- operative nausea and/or vomiting:  1. The medication in the patch is effective for 72 hours, after which it should be removed.  Wrap patch in a tissue and discard in the trash. Wash hands thoroughly with soap and water. 2. You may remove the patch earlier than 72 hours if you experience unpleasant side effects which may include dry mouth, dizziness or visual disturbances. 3. Avoid touching the patch. Wash your hands with soap and water after contact with the patch.

## 2019-11-22 NOTE — Anesthesia Procedure Notes (Signed)
Procedure Name: LMA Insertion Date/Time: 11/22/2019 7:36 AM Performed by: Gerald Leitz, CRNA Pre-anesthesia Checklist: Patient identified, Patient being monitored, Timeout performed, Emergency Drugs available and Suction available Patient Re-evaluated:Patient Re-evaluated prior to induction Oxygen Delivery Method: Circle system utilized Preoxygenation: Pre-oxygenation with 100% oxygen Induction Type: IV induction Ventilation: Mask ventilation without difficulty LMA: LMA inserted and LMA with gastric port inserted LMA Size: 4.0 Tube type: Oral Number of attempts: 1 Placement Confirmation: positive ETCO2 and breath sounds checked- equal and bilateral Tube secured with: Tape Dental Injury: Teeth and Oropharynx as per pre-operative assessment

## 2019-11-23 LAB — SURGICAL PATHOLOGY

## 2019-12-11 DIAGNOSIS — R87619 Unspecified abnormal cytological findings in specimens from cervix uteri: Secondary | ICD-10-CM | POA: Insufficient documentation

## 2019-12-11 DIAGNOSIS — G43909 Migraine, unspecified, not intractable, without status migrainosus: Secondary | ICD-10-CM | POA: Insufficient documentation

## 2019-12-11 DIAGNOSIS — E890 Postprocedural hypothyroidism: Secondary | ICD-10-CM | POA: Insufficient documentation

## 2020-02-07 LAB — HM PAP SMEAR: HM Pap smear: NEGATIVE

## 2020-02-07 LAB — RESULTS CONSOLE HPV: CHL HPV: NEGATIVE

## 2020-02-11 ENCOUNTER — Other Ambulatory Visit: Payer: Self-pay | Admitting: Obstetrics and Gynecology

## 2020-02-11 DIAGNOSIS — R928 Other abnormal and inconclusive findings on diagnostic imaging of breast: Secondary | ICD-10-CM

## 2020-02-26 ENCOUNTER — Ambulatory Visit
Admission: RE | Admit: 2020-02-26 | Discharge: 2020-02-26 | Disposition: A | Discharge status: Home / Self Care | Payer: Federal, State, Local not specified - PPO | Source: Ambulatory Visit | Attending: Obstetrics and Gynecology | Admitting: Obstetrics and Gynecology

## 2020-02-26 ENCOUNTER — Other Ambulatory Visit: Payer: Self-pay

## 2020-02-26 ENCOUNTER — Other Ambulatory Visit: Payer: Self-pay | Admitting: Obstetrics and Gynecology

## 2020-02-26 DIAGNOSIS — R928 Other abnormal and inconclusive findings on diagnostic imaging of breast: Secondary | ICD-10-CM

## 2020-02-26 DIAGNOSIS — N6310 Unspecified lump in the right breast, unspecified quadrant: Secondary | ICD-10-CM

## 2020-03-18 ENCOUNTER — Ambulatory Visit
Admission: RE | Admit: 2020-03-18 | Discharge: 2020-03-18 | Disposition: A | Discharge status: Home / Self Care | Payer: Federal, State, Local not specified - PPO | Source: Ambulatory Visit | Attending: Obstetrics and Gynecology | Admitting: Obstetrics and Gynecology

## 2020-03-18 ENCOUNTER — Other Ambulatory Visit: Payer: Self-pay

## 2020-03-18 DIAGNOSIS — N6310 Unspecified lump in the right breast, unspecified quadrant: Secondary | ICD-10-CM

## 2021-04-29 DIAGNOSIS — R109 Unspecified abdominal pain: Secondary | ICD-10-CM | POA: Diagnosis not present

## 2021-04-29 DIAGNOSIS — R110 Nausea: Secondary | ICD-10-CM | POA: Diagnosis not present

## 2021-04-29 DIAGNOSIS — R21 Rash and other nonspecific skin eruption: Secondary | ICD-10-CM | POA: Diagnosis not present

## 2021-04-30 ENCOUNTER — Other Ambulatory Visit: Payer: Self-pay

## 2021-04-30 ENCOUNTER — Emergency Department: Payer: Federal, State, Local not specified - PPO

## 2021-04-30 ENCOUNTER — Emergency Department
Admission: EM | Admit: 2021-04-30 | Discharge: 2021-04-30 | Disposition: A | Discharge status: Home / Self Care | Payer: Federal, State, Local not specified - PPO | Attending: Emergency Medicine | Admitting: Emergency Medicine

## 2021-04-30 DIAGNOSIS — Z79899 Other long term (current) drug therapy: Secondary | ICD-10-CM | POA: Diagnosis not present

## 2021-04-30 DIAGNOSIS — E039 Hypothyroidism, unspecified: Secondary | ICD-10-CM | POA: Insufficient documentation

## 2021-04-30 DIAGNOSIS — N83202 Unspecified ovarian cyst, left side: Secondary | ICD-10-CM | POA: Insufficient documentation

## 2021-04-30 DIAGNOSIS — K5730 Diverticulosis of large intestine without perforation or abscess without bleeding: Secondary | ICD-10-CM | POA: Diagnosis not present

## 2021-04-30 DIAGNOSIS — R109 Unspecified abdominal pain: Secondary | ICD-10-CM | POA: Diagnosis not present

## 2021-04-30 DIAGNOSIS — D259 Leiomyoma of uterus, unspecified: Secondary | ICD-10-CM | POA: Diagnosis not present

## 2021-04-30 DIAGNOSIS — K760 Fatty (change of) liver, not elsewhere classified: Secondary | ICD-10-CM | POA: Diagnosis not present

## 2021-04-30 DIAGNOSIS — R1011 Right upper quadrant pain: Secondary | ICD-10-CM | POA: Diagnosis not present

## 2021-04-30 DIAGNOSIS — Z20822 Contact with and (suspected) exposure to covid-19: Secondary | ICD-10-CM | POA: Insufficient documentation

## 2021-04-30 DIAGNOSIS — R21 Rash and other nonspecific skin eruption: Secondary | ICD-10-CM | POA: Diagnosis not present

## 2021-04-30 DIAGNOSIS — R232 Flushing: Secondary | ICD-10-CM | POA: Diagnosis not present

## 2021-04-30 DIAGNOSIS — N83292 Other ovarian cyst, left side: Secondary | ICD-10-CM | POA: Diagnosis not present

## 2021-04-30 DIAGNOSIS — R1013 Epigastric pain: Secondary | ICD-10-CM | POA: Diagnosis not present

## 2021-04-30 LAB — COMPREHENSIVE METABOLIC PANEL
ALT: 20 U/L (ref 0–44)
AST: 15 U/L (ref 15–41)
Albumin: 4.4 g/dL (ref 3.5–5.0)
Alkaline Phosphatase: 67 U/L (ref 38–126)
Anion gap: 9 (ref 5–15)
BUN: 11 mg/dL (ref 6–20)
CO2: 25 mmol/L (ref 22–32)
Calcium: 8.8 mg/dL — ABNORMAL LOW (ref 8.9–10.3)
Chloride: 101 mmol/L (ref 98–111)
Creatinine, Ser: 0.68 mg/dL (ref 0.44–1.00)
GFR, Estimated: >60 mL/min (ref >60)
Glucose, Bld: 91 mg/dL (ref 70–99)
Potassium: 4.2 mmol/L (ref 3.5–5.1)
Sodium: 135 mmol/L (ref 135–145)
Total Bilirubin: 0.9 mg/dL (ref 0.3–1.2)
Total Protein: 7.6 g/dL (ref 6.5–8.1)

## 2021-04-30 LAB — URINALYSIS, COMPLETE (UACMP) WITH MICROSCOPIC
Bilirubin Urine: NEGATIVE
Glucose, UA: NEGATIVE mg/dL
Ketones, ur: NEGATIVE mg/dL
Leukocytes,Ua: NEGATIVE
Nitrite: NEGATIVE
Protein, ur: NEGATIVE mg/dL
Specific Gravity, Urine: 1.015 (ref 1.005–1.030)
pH: 7 (ref 5.0–8.0)

## 2021-04-30 LAB — CBC
HCT: 43.6 % (ref 36.0–46.0)
Hemoglobin: 15.2 g/dL — ABNORMAL HIGH (ref 12.0–15.0)
MCH: 27.1 pg (ref 26.0–34.0)
MCHC: 34.9 g/dL (ref 30.0–36.0)
MCV: 77.7 fL — ABNORMAL LOW (ref 80.0–100.0)
Platelets: 301 10*3/uL (ref 150–400)
RBC: 5.61 MIL/uL — ABNORMAL HIGH (ref 3.87–5.11)
RDW: 13.5 % (ref 11.5–15.5)
WBC: 10.9 10*3/uL — ABNORMAL HIGH (ref 4.0–10.5)
nRBC: 0 % (ref 0.0–0.2)

## 2021-04-30 LAB — RESP PANEL BY RT-PCR (FLU A&B, COVID) ARPGX2
Influenza A by PCR: NEGATIVE
Influenza B by PCR: NEGATIVE
SARS Coronavirus 2 by RT PCR: NEGATIVE

## 2021-04-30 LAB — POC URINE PREG, ED: Preg Test, Ur: NEGATIVE

## 2021-04-30 LAB — LIPASE, BLOOD: Lipase: 104 U/L — ABNORMAL HIGH (ref 11–51)

## 2021-04-30 MED ORDER — SODIUM CHLORIDE 0.9 % IV BOLUS
1000.0000 mL | Freq: Once | INTRAVENOUS | Status: AC
  Administered 2021-04-30: 1000 mL via INTRAVENOUS

## 2021-04-30 MED ORDER — HYDROCODONE-ACETAMINOPHEN 5-325 MG PO TABS: 1.0000 | ORAL_TABLET | Freq: Four times a day (QID) | ORAL | 0 refills | Status: AC | PRN

## 2021-04-30 MED ORDER — MORPHINE SULFATE (PF) 4 MG/ML IV SOLN
4.0000 mg | Freq: Once | INTRAVENOUS | Status: AC
  Administered 2021-04-30: 4 mg via INTRAVENOUS
  Filled 2021-04-30: qty 1

## 2021-04-30 MED ORDER — ONDANSETRON 4 MG PO TBDP: 4.0000 mg | ORAL_TABLET | Freq: Three times a day (TID) | ORAL | 0 refills | Status: AC | PRN

## 2021-04-30 MED ORDER — IOHEXOL 350 MG/ML SOLN
100.0000 mL | Freq: Once | INTRAVENOUS | Status: AC | PRN
  Administered 2021-04-30: 100 mL via INTRAVENOUS
  Filled 2021-04-30: qty 100

## 2021-04-30 MED ORDER — ONDANSETRON 4 MG PO TBDP
4.0000 mg | ORAL_TABLET | Freq: Once | ORAL | Status: AC
  Administered 2021-04-30: 4 mg via ORAL
  Filled 2021-04-30: qty 1

## 2021-04-30 MED ORDER — HYDROCODONE-ACETAMINOPHEN 5-325 MG PO TABS
1.0000 | ORAL_TABLET | Freq: Once | ORAL | Status: AC
  Administered 2021-04-30: 1 via ORAL
  Filled 2021-04-30: qty 1

## 2021-04-30 MED ORDER — FAMOTIDINE 40 MG PO TABS: 40.0000 mg | ORAL_TABLET | Freq: Every evening | ORAL | 1 refills | Status: DC

## 2021-04-30 MED ORDER — ONDANSETRON HCL 4 MG/2ML IJ SOLN
4.0000 mg | Freq: Once | INTRAMUSCULAR | Status: AC
  Administered 2021-04-30: 4 mg via INTRAVENOUS
  Filled 2021-04-30: qty 2

## 2021-04-30 NOTE — ED Triage Notes (Signed)
Pt to ER via [facility]. Reports epigastric pain that radiates into right upper quadrant that started yesterday. Reports also feeling bloated and having nausea/ vomiting. Pt reports being seen at urgent care yesterday and being prescribed medication for acid reflux but having no relief in symptoms. No urinary symptoms. Headache that developed today.   Pt also with rash that started on entire body and neck yesterday- reports being seen at the dermatologist for this.

## 2021-04-30 NOTE — ED Provider Notes (Signed)
[facility]-EMERGENCY DEPARTMENT  ____________________________________________  Time seen: Approximately 3:15 PM  I have reviewed the triage vital signs and the nursing notes.   HISTORY  Chief Complaint Abdominal Pain   Historian Patient     HPI Meghan Leach is a 40 y.o. female presents to the emergency department with constant 8 out of 10 epigastric and right upper quadrant abdominal pain for the past 2 days. Pain seems to radiate to her back.  Patient states that last week, she developed some nausea after eating and states that she feels bloated.  She denies diarrhea or vomiting.  She has been afebrile at home.  She states that she did wake up today with a sore throat and has had some nasal congestion but had a negative at home COVID-19 test.  Patient denies excessive use of anti-inflammatories or alcohol at home stating that she drinks less than 1 alcoholic beverage a week.   Past Medical History:  Diagnosis Date   Anemia    Endometriosis    Fibroid, uterine    Headache    History of Graves' disease    Hypothyroidism    Ovarian cyst    PCOS (polycystic ovarian syndrome)    PONV (postoperative nausea and vomiting)    NAUSEA   Thyroid disease      Immunizations up to date:  Yes.     Past Medical History:  Diagnosis Date   Anemia    Endometriosis    Fibroid, uterine    Headache    History of Graves' disease    Hypothyroidism    Ovarian cyst    PCOS (polycystic ovarian syndrome)    PONV (postoperative nausea and vomiting)    NAUSEA   Thyroid disease     There are no problems to display for this patient.   Past Surgical History:  Procedure Laterality Date   DILATION AND EVACUATION N/A 11/22/2019   Procedure: DILATATION AND EVACUATION;  Surgeon: Marylynn Pearson, MD;  Location: [place];  Service: Gynecology;  Laterality: N/A;   OVARIAN CYST REMOVAL     THYROIDECTOMY  2006   TOTAL DONE FOR GRAVES DISEASE   TONSILLECTOMY  AS CHILD     Prior to Admission medications   Medication Sig Start Date End Date Taking? Authorizing Provider  famotidine (PEPCID) 40 MG tablet Take 1 tablet (40 mg total) by mouth every evening for 7 days. 04/30/21 05/07/21 Yes Vallarie Mare M, PA-C  HYDROcodone-acetaminophen (NORCO) 5-325 MG tablet Take 1 tablet by mouth every 6 (six) hours as needed for up to 3 days. 04/30/21 05/03/21 Yes Vallarie Mare M, PA-C  ondansetron (ZOFRAN ODT) 4 MG disintegrating tablet Take 1 tablet (4 mg total) by mouth every 8 (eight) hours as needed for up to 5 days. 04/30/21 05/05/21 Yes Vallarie Mare M, PA-C  ibuprofen (ADVIL) 600 MG tablet Take 1 tablet (600 mg total) by mouth every 6 (six) hours as needed. 11/22/19   Marylynn Pearson, MD  methylergonovine (METHERGINE) 0.2 MG tablet Take 1 tablet (0.2 mg total) by mouth 3 (three) times daily. 11/22/19   Marylynn Pearson, MD  Omega-3 Fatty Acids (FISH OIL) 1000 MG CAPS Take by mouth. 2 capsules daily    [provider]  oxyCODONE-acetaminophen (PERCOCET) 5-325 MG tablet Take 1-2 tablets by mouth every 6 (six) hours as needed for severe pain. 11/22/19   Marylynn Pearson, MD  Prenatal Vit-Fe Fumarate-FA (MULTIVITAMIN-PRENATAL) 27-0.8 MG TABS tablet Take 1 tablet by mouth daily at 12 noon.    [provider]  thyroid (ARMOUR) 60 MG tablet Take 60 mg by mouth 2 (two) times daily.     [provider]  UNABLE TO FIND Med Name: prometrium suppository 50 mg at hs rectally    [provider]    Allergies Lactose intolerance (gi) and Metformin and related  No family history on file.  Social History Social History   Tobacco Use   Smoking status: Never   Smokeless tobacco: Never  Vaping Use   Vaping Use: Never used  Substance Use Topics   Alcohol use: No   Drug use: No     Review of Systems  Constitutional: No fever/chills Eyes:  No discharge ENT: No upper respiratory complaints. Respiratory: no cough. No SOB/ use of accessory muscles to  breath Gastrointestinal: Patient has nausea and abdominal pain.  Musculoskeletal: Negative for musculoskeletal pain. Skin: Negative for rash, abrasions, lacerations, ecchymosis.    ____________________________________________   PHYSICAL EXAM:  VITAL SIGNS: ED Triage Vitals  Enc Vitals Group     BP 04/30/21 1253 (!) 154/90     Pulse Rate 04/30/21 1253 (!) 101     Resp 04/30/21 1253 18     Temp 04/30/21 1253 98.4 F (36.9 C)     Temp Source 04/30/21 1253 Oral     SpO2 04/30/21 1253 100 %     Weight 04/30/21 1254 252 lb (114.3 kg)     Height 04/30/21 1254 '5\' 5"'$  (1.651 m)     Head Circumference --      Peak Flow --      Pain Score 04/30/21 1253 7     Pain Loc --      Pain Edu? --      Excl. in [place]? --      Constitutional: Alert and oriented. Well appearing and in no acute distress. Eyes: Conjunctivae are normal. PERRL. EOMI. Head: Atraumatic. ENT:      Nose: No congestion/rhinnorhea.      Mouth/Throat: Mucous membranes are moist.  Neck: No stridor.  No cervical spine tenderness to palpation. Cardiovascular: Normal rate, regular rhythm. Normal S1 and S2.  Good peripheral circulation. Respiratory: Normal respiratory effort without tachypnea or retractions. Lungs CTAB. Good air entry to the bases with no decreased or absent breath sounds Gastrointestinal: Bowel sounds x 4 quadrants.  Positive Murphy sign.  Patient has guarding in the right upper quadrant.  No guarding to palpation in the epigastric abdomen. Musculoskeletal: Full range of motion to all extremities. No obvious deformities noted Neurologic:  Normal for age. No gross focal neurologic deficits are appreciated.  Skin:  Skin is warm, dry and intact. No rash noted. Psychiatric: Mood and affect are normal for age. Speech and behavior are normal.   ____________________________________________   LABS (all labs ordered are listed, but only abnormal results are displayed)  Labs Reviewed  LIPASE, BLOOD - Abnormal;  Notable for the following components:      Result Value   Lipase 104 (*)    All other components within normal limits  COMPREHENSIVE METABOLIC PANEL - Abnormal; Notable for the following components:   Calcium 8.8 (*)    All other components within normal limits  CBC - Abnormal; Notable for the following components:   WBC 10.9 (*)    RBC 5.61 (*)    Hemoglobin 15.2 (*)    MCV 77.7 (*)    All other components within normal limits  URINALYSIS, COMPLETE (UACMP) WITH MICROSCOPIC - Abnormal; Notable for the following components:   Hgb  urine dipstick TRACE (*)    Bacteria, UA RARE (*)    All other components within normal limits  RESP PANEL BY RT-PCR (FLU A&B, COVID) ARPGX2  POC URINE PREG, ED   ____________________________________________  EKG   ____________________________________________  RADIOLOGY Unk Pinto, personally viewed and evaluated these images (plain radiographs) as part of my medical decision making, as well as reviewing the written report by the radiologist.    CT ABDOMEN PELVIS W CONTRAST  Result Date: 04/30/2021 CLINICAL DATA:  Epigastric pain that radiates to right upper quadrant since yesterday. EXAM: CT ABDOMEN AND PELVIS WITH CONTRAST TECHNIQUE: Multidetector CT imaging of the abdomen and pelvis was performed using the standard protocol following bolus administration of intravenous contrast. CONTRAST:  158m OMNIPAQUE IOHEXOL 350 MG/ML SOLN COMPARISON:  Same day abdominal ultrasound.  CT October 22, 2017 FINDINGS: Lower chest: No acute abnormality. Hepatobiliary: No focal liver abnormality is seen. No gallstones, gallbladder wall thickening, or biliary dilatation. Pancreas: Focal fatty invagination along the genu of the pancreas, benign. No evidence of acute inflammation or pancreatic ductal dilation. Spleen: Within normal limits. Adrenals/Urinary Tract: Adrenal glands are unremarkable. Kidneys are normal, without renal calculi, solid enhancing lesion, or  hydronephrosis. Bladder is unremarkable for degree of distension. Stomach/Bowel: Stomach is unremarkable for degree of distension. No pathologic dilation of small or large bowel. Normal appendix. Fatty infiltration involving the wall of the terminal ileum and proximal colon. Scattered colonic diverticulosis without findings of acute diverticulitis. Vascular/Lymphatic: No abdominal aortic aneurysm. No pathologically enlarged abdominal or pelvic lymph nodes. And right lower quadrant Reproductive: Leiomyomatous uterus. Dominant follicle in the right ovary. Complex appearing 5.6 cm left ovarian cyst. Other: Trace pelvic free fluid. No walled off fluid collections. No pneumoperitoneum. No portal venous gas. Musculoskeletal: No acute or significant osseous findings. IMPRESSION: 1. Complex appearing 5.6 cm left ovarian cyst with some pelvic free fluid, possibly representing a rupturing ovarian cyst. Consider further evaluation with pelvic ultrasound. 2. Leiomyomatous uterus. 3. Fatty infiltration involving the wall of the terminal ileum and proximal colon, which can be seen with chronic inflammatory bowel disease. 4. Scattered colonic diverticulosis without findings of acute diverticulitis. Electronically Signed   By: JDahlia BailiffM.D.   On: 04/30/2021 19:17   [place] COMPLETE W TRANSVAGINAL AND TORSION R/O  Result Date: 04/30/2021 CLINICAL DATA:  Abdominal discomfort.  Left ovarian cyst on CT. EXAM: TRANSABDOMINAL AND TRANSVAGINAL ULTRASOUND OF PELVIS DOPPLER ULTRASOUND OF OVARIES TECHNIQUE: Both transabdominal and transvaginal ultrasound examinations of the pelvis were performed. Transabdominal technique was performed for global imaging of the pelvis including uterus, ovaries, adnexal regions, and pelvic cul-de-sac. It was necessary to proceed with endovaginal exam following the transabdominal exam to visualize the uterus ovaries and adnexa. Color and duplex Doppler ultrasound was utilized to evaluate blood flow  to the ovaries. COMPARISON:  CT earlier today.  Pelvic ultrasound 10/22/2017 FINDINGS: Uterus Measurements: 10.3 x 6.8 x 5.8 cm = volume: 211 mL. Three fibroids are seen. Largest lesion is in the mid uterus measuring 3.3 x 3.7 x 3.5 cm, mural or subserosal. There is a 1.7 x 1.5 x 1.8 cm hypoechoic fibroid anteriorly that is subserosal. A 1.5 x 1.5 x 1.5 cm fibroid is partially exophytic. Endometrium Thickness: 11 mm, normal.  No focal abnormality visualized. Right ovary Not visualized by ultrasound. Left ovary Measurements: 5.2 x 4.4 x 3.7 cm = volume: 44 mL. There is a 2.9 x 3.2 x 3.1 cm minimally complex cyst with low-level internal echoes. No nodular or  soft tissue components. Blood flow seen to the adjacent ovarian parenchyma. Pulsed Doppler evaluation of the left ovary demonstrates normal low-resistance arterial and venous waveforms. Other findings Free fluid on CT is not well seen by ultrasound. IMPRESSION: 1. Mildly complex left ovarian cyst measuring 3.2 cm. This may represent a hemorrhagic cyst. Normal blood flow to the adjacent ovarian parenchyma, no torsion. 2. Adjacent free fluid on CT earlier today is not well-defined by ultrasound. No significant free fluid is seen. 3. Uterine fibroids. 4. Right ovary not seen. Electronically Signed   By: Keith Rake M.D.   On: 04/30/2021 21:06   US Abdomen Limited RUQ (LIVER/GB)  Result Date: 04/30/2021 CLINICAL DATA:  Abdominal pain. EXAM: ULTRASOUND ABDOMEN LIMITED RIGHT UPPER QUADRANT COMPARISON:  None. FINDINGS: Gallbladder: Physiologically distended. No gallstones or wall thickening visualized. No sonographic Murphy sign noted by sonographer. Common bile duct: Diameter: 3 mm, normal. Liver: No focal lesion identified. Within normal limits in parenchymal echogenicity. Portal vein is patent on color Doppler imaging with normal direction of blood flow towards the liver. Other: No right upper quadrant ascites. IMPRESSION: Unremarkable right upper quadrant  ultrasound.  No gallstones. Electronically Signed   By: Keith Rake M.D.   On: 04/30/2021 17:46    ____________________________________________    PROCEDURES  Procedure(s) performed:     Procedures     Medications  sodium chloride 0.9 % bolus 1,000 mL (0 mLs Intravenous Stopped 04/30/21 1700)  morphine 4 MG/ML injection 4 mg (4 mg Intravenous Given 04/30/21 1539)  ondansetron (ZOFRAN) injection 4 mg (4 mg Intravenous Given 04/30/21 1539)  sodium chloride 0.9 % bolus 1,000 mL (0 mLs Intravenous Stopped 04/30/21 1910)  iohexol (OMNIPAQUE) 350 MG/ML injection 100 mL (100 mLs Intravenous Contrast Given 04/30/21 1836)  HYDROcodone-acetaminophen (NORCO/VICODIN) 5-325 MG per tablet 1 tablet (1 tablet Oral Given 04/30/21 2151)  ondansetron (ZOFRAN-ODT) disintegrating tablet 4 mg (4 mg Oral Given 04/30/21 2151)     ____________________________________________   INITIAL IMPRESSION / ASSESSMENT AND PLAN / ED COURSE  Pertinent labs & imaging results that were available during my care of the patient were reviewed by me and considered in my medical decision making (see chart for details).      Assessment and Plan: Abdominal pain:  41 year old female presents to the emergency department with abdominal pain.  Patient initially stated that she had pain in the right upper quadrant and epigastric abdomen and while waiting in the emergency department, also developed left lower quadrant abdominal pain.  Patient was hypertensive at triage but vital signs were otherwise reassuring.  Patient initially had a positive Murphy sign on exam with guarding in the right upper quadrant.  Differential diagnosis included cholecystitis, gallstone pancreatitis, pancreatitis, gastritis, pyelonephritis....  Patient had very mild leukocytosis and elevated lipase.  LFTs were within reference range.  Right upper quadrant ultrasound was not suggestive of cholecystitis, cholelithiasis or choledocholithiasis.  CT abdomen  pelvis was obtained which showed no evidence of pancreatitis but did suggest early fatty infiltration of the pancreas.  CT abdomen pelvis identified a 5.6 centimeters left-sided ovarian cyst and given endorsement of left lower quadrant abdominal pain, dedicated pelvic ultrasound was obtained which showed no evidence of ovarian torsion and is likely hemorrhagic in nature.  Patient was discharged with Norco for pain after receiving 2 L of normal saline and IV morphine.  She was also discharged with Zofran.  My suspicion for acid reflux/GERD is low but I did start patient on H2 blocker, Pepcid daily over the next week.  Return precautions were given to return with new or worsening symptoms.  All patient questions were answered.   ____________________________________________  FINAL CLINICAL IMPRESSION(S) / ED DIAGNOSES  Final diagnoses:  Abdominal pain  Abdominal discomfort      NEW MEDICATIONS STARTED DURING THIS VISIT:  ED Discharge Orders          Ordered    HYDROcodone-acetaminophen (NORCO) 5-325 MG tablet  Every 6 hours PRN        04/30/21 2135    ondansetron (ZOFRAN ODT) 4 MG disintegrating tablet  Every 8 hours PRN        04/30/21 2135    famotidine (PEPCID) 40 MG tablet  Every evening        04/30/21 2135                This chart was dictated using voice recognition software/Dragon. Despite best efforts to proofread, errors can occur which can change the meaning. Any change was purely unintentional.     Lannie Fields, PA-C 04/30/21 2235    Delman Kitten, MD 05/01/21 [phone]

## 2021-04-30 NOTE — Discharge Instructions (Signed)
You can take Norco for pain and Zofran for nausea. Take 40 mg of Pepcid once daily.

## 2021-07-11 ENCOUNTER — Ambulatory Visit
Admission: EM | Admit: 2021-07-11 | Discharge: 2021-07-11 | Disposition: A | Discharge status: Home / Self Care | Payer: Federal, State, Local not specified - PPO | Attending: Emergency Medicine | Admitting: Emergency Medicine

## 2021-07-11 DIAGNOSIS — J069 Acute upper respiratory infection, unspecified: Secondary | ICD-10-CM | POA: Diagnosis not present

## 2021-07-11 MED ORDER — PROMETHAZINE-DM 6.25-15 MG/5ML PO SYRP: 5.0000 mL | ORAL_SOLUTION | Freq: Four times a day (QID) | ORAL | 0 refills | Status: DC | PRN

## 2021-07-11 MED ORDER — BENZONATATE 100 MG PO CAPS: 200.0000 mg | ORAL_CAPSULE | Freq: Three times a day (TID) | ORAL | 0 refills | Status: DC

## 2021-07-11 MED ORDER — IPRATROPIUM BROMIDE 0.06 % NA SOLN: 2.0000 | Freq: Four times a day (QID) | NASAL | 12 refills | Status: DC

## 2021-07-11 NOTE — Discharge Instructions (Signed)
Use the Atrovent nasal spray, 2 squirts in each nostril every 6 hours, as needed for runny nose and postnasal drip.  Use the Tessalon Perles every 8 hours during the day.  Take them with a small sip of water.  They may give you some numbness to the base of your tongue or a metallic taste in your mouth, this is normal.  Use the Promethazine DM cough syrup at bedtime for cough and congestion.  It will make you drowsy so do not take it during the day.  Take the ibuprofen, 600 mg every 6 hours with food, on a schedule for the next 48 hours and then as needed.  Apply moist heat to your back for 30 minutes at a time 2-3 times a day to improve blood flow to the area and help remove the lactic acid causing the spasm.  Return for reevaluation for any new or worsening symptoms.   Return for reevaluation or see your primary care provider for any new or worsening symptoms.

## 2021-07-11 NOTE — ED Triage Notes (Signed)
Pt here with C/O Cough, chest congestion, upper back pain for 5 days since Tuesday. Took Covid test at home was negative.

## 2021-07-11 NOTE — ED Provider Notes (Signed)
[facility]    CSN: [id number] Arrival date & time: 07/11/21  1030      History   Chief Complaint Chief Complaint  Patient presents with   Cough    HPI Meghan Leach is a 40 y.o. female.   HPI  40 year old female here for evaluation of respiratory complaints.  Patient reports that she has been experiencing cough, chest congestion, and upper back pain for the past 5 days.  She is taken multiple home COVID test and they are all negative.  She states that her symptoms initially started with a sore throat, headache, and then developed the nonproductive cough after couple days.  She also chills and sweats at the first part but she did not take her temperature.  Now she is complaining of some ear pain, pain in her upper teeth, chest pain along her sternum, shortness of breath, and wheezing.  She had 1 episode of nausea and vomiting 3 days ago but has not returned.  As of today she is starting to have nasal discharge which is clear in color.  Patient denies any diarrhea.  Past Medical History:  Diagnosis Date   Anemia    Endometriosis    Fibroid, uterine    Headache    History of Graves' disease    Hypothyroidism    Ovarian cyst    PCOS (polycystic ovarian syndrome)    PONV (postoperative nausea and vomiting)    NAUSEA   Thyroid disease     There are no problems to display for this patient.   Past Surgical History:  Procedure Laterality Date   DILATION AND EVACUATION N/A 11/22/2019   Procedure: DILATATION AND EVACUATION;  Surgeon: Marylynn Pearson, MD;  Location: [place];  Service: Gynecology;  Laterality: N/A;   OVARIAN CYST REMOVAL     THYROIDECTOMY  2006   TOTAL DONE FOR GRAVES DISEASE   TONSILLECTOMY  AS CHILD    OB History     Gravida  1   Para      Term      Preterm      AB      Living         SAB      IAB      Ectopic      Multiple      Live Births               Home Medications    Prior to  Admission medications   Medication Sig Start Date End Date Taking? Authorizing Provider  benzonatate (TESSALON) 100 MG capsule Take 2 capsules (200 mg total) by mouth every 8 (eight) hours. 07/11/21  Yes Margarette Canada, NP  famotidine (PEPCID) 40 MG tablet Take 1 tablet (40 mg total) by mouth every evening for 7 days. 04/30/21 07/11/21 Yes Vallarie Mare M, PA-C  ipratropium (ATROVENT) 0.06 % nasal spray Place 2 sprays into both nostrils 4 (four) times daily. 07/11/21  Yes Margarette Canada, NP  Omega-3 Fatty Acids (FISH OIL) 1000 MG CAPS Take by mouth. 2 capsules daily   Yes [provider]  Prenatal Vit-Fe Fumarate-FA (MULTIVITAMIN-PRENATAL) 27-0.8 MG TABS tablet Take 1 tablet by mouth daily at 12 noon.   Yes [provider]  promethazine-dextromethorphan (PROMETHAZINE-DM) 6.25-15 MG/5ML syrup Take 5 mLs by mouth 4 (four) times daily as needed. 07/11/21  Yes Margarette Canada, NP  thyroid (ARMOUR) 60 MG tablet Take 60 mg by mouth 2 (two) times daily.    Yes [provider]  UNABLE TO FIND Med Name: prometrium suppository 50 mg at hs rectally   Yes [provider]  methylergonovine (METHERGINE) 0.2 MG tablet Take 1 tablet (0.2 mg total) by mouth 3 (three) times daily. 11/22/19   Marylynn Pearson, MD  oxyCODONE-acetaminophen (PERCOCET) 5-325 MG tablet Take 1-2 tablets by mouth every 6 (six) hours as needed for severe pain. 11/22/19   Marylynn Pearson, MD    Family History History reviewed. No pertinent family history.  Social History Social History   Tobacco Use   Smoking status: Never   Smokeless tobacco: Never  Vaping Use   Vaping Use: Never used  Substance Use Topics   Alcohol use: No   Drug use: No     Allergies   Lactose intolerance (gi) and Metformin and related   Review of Systems Review of Systems  Constitutional:  Positive for chills and diaphoresis. Negative for activity change, appetite change and fever.  HENT:  Positive for congestion, ear pain,  rhinorrhea and sore throat.   Respiratory:  Positive for cough, shortness of breath and wheezing.   Cardiovascular:  Positive for chest pain.  Gastrointestinal:  Positive for nausea and vomiting. Negative for diarrhea.  Skin:  Negative for rash.  Hematological: Negative.   Psychiatric/Behavioral: Negative.      Physical Exam Triage Vital Signs ED Triage Vitals  Enc Vitals Group     BP 07/11/21 1113 138/87     Pulse Rate 07/11/21 1113 98     Resp 07/11/21 1113 18     Temp 07/11/21 1113 99.1 F (37.3 C)     Temp Source 07/11/21 1113 Oral     SpO2 07/11/21 1113 99 %     Weight 07/11/21 1111 225 lb (102.1 kg)     Height 07/11/21 1111 5\' 5"  (1.651 m)     Head Circumference --      Peak Flow --      Pain Score 07/11/21 1111 4     Pain Loc --      Pain Edu? --      Excl. in [place]? --    No data found.  Updated Vital Signs BP 138/87 (BP Location: Left Arm)   Pulse 98   Temp 99.1 F (37.3 C) (Oral)   Resp 18   Ht 5\' 5"  (1.651 m)   Wt 225 lb (102.1 kg)   SpO2 99%   BMI 37.44 kg/m   Visual Acuity Right Eye Distance:   Left Eye Distance:   Bilateral Distance:    Right Eye Near:   Left Eye Near:    Bilateral Near:     Physical Exam Vitals and nursing note reviewed.  Constitutional:      General: She is not in acute distress.    Appearance: Normal appearance. She is not ill-appearing.  HENT:     Head: Normocephalic and atraumatic.     Right Ear: Tympanic membrane, ear canal and external ear normal. There is no impacted cerumen.     Left Ear: Tympanic membrane, ear canal and external ear normal. There is no impacted cerumen.     Nose: Congestion and rhinorrhea present.     Mouth/Throat:     Mouth: Mucous membranes are moist.     Pharynx: Oropharynx is clear. Posterior oropharyngeal erythema present.  Cardiovascular:     Rate and Rhythm: Normal rate and regular rhythm.     Pulses: Normal pulses.     Heart sounds: Normal heart sounds. No murmur heard.  No gallop.   Pulmonary:     Effort: Pulmonary effort is normal.     Breath sounds: Normal breath sounds. No wheezing, rhonchi or rales.  Musculoskeletal:        General: Tenderness present. No swelling or deformity.     Cervical back: Normal range of motion and neck supple.  Lymphadenopathy:     Cervical: No cervical adenopathy.  Skin:    General: Skin is warm and dry.     Capillary Refill: Capillary refill takes less than 2 seconds.     Findings: No bruising, erythema or rash.  Neurological:     General: No focal deficit present.     Mental Status: She is alert and oriented to person, place, and time.  Psychiatric:        Mood and Affect: Mood normal.        Behavior: Behavior normal.        Thought Content: Thought content normal.        Judgment: Judgment normal.     UC Treatments / Results  Labs (all labs ordered are listed, but only abnormal results are displayed) Labs Reviewed - No data to display  EKG   Radiology No results found.  Procedures Procedures (including critical care time)  Medications Ordered in UC Medications - No data to display  Initial Impression / Assessment and Plan / UC Course  I have reviewed the triage vital signs and the nursing notes.  Pertinent labs & imaging results that were available during my care of the patient were reviewed by me and considered in my medical decision making (see chart for details).  Patient is a nontoxic-appearing 40 year old female here for evaluation of respiratory complaints as outlined in the HPI above.  Patient's physical exam reveals pearly gray tympanic membranes bilaterally with normal light reflex and clear external auditory canals.  Nasal mucosa is erythematous and edematous with clear nasal discharge in both nares.  Oropharyngeal exam reveals mild posterior oropharyngeal erythema and injection with clear postnasal drip.  Tonsils are surgically absent.  No cervical lymphadenopathy appreciated on exam.  Cardiopulmonary  exam reveals clear lung sounds in all fields.  Patient has tenderness with palpation of the costosternal joints on both sides of her sternum and reports that her chest pain is there in the middle of her chest and hurts with deep breathing, coughing, or laughing.  She also has mild tenderness without overt spasm to the upper thoracic paraspinous region bilaterally.  I am suspicious that patient, though she did not measure a fever, had the flu earlier in the week but she is outside the window for testing and treatment.  She has performed multiple COVID tests at home that are negative.  Patient's exam is consistent with a viral upper respiratory infection and cough.  We will treat her symptomatically with Atrovent nasal spray help with nasal congestion, Tessalon Perles and Promethazine DM but with cough and congestion.  I have advised her to use ibuprofen to help with the body aches as well as moist heat application to improve blood flow to the muscle groups to help alleviate pain and metabolic byproducts of inflammation.  Patient advised to return for new or worsening symptoms.   Final Clinical Impressions(s) / UC Diagnoses   Final diagnoses:  Viral URI with cough     Discharge Instructions      Use the Atrovent nasal spray, 2 squirts in each nostril every 6 hours, as needed for runny nose and postnasal drip.  Use  the [employer] every 8 hours during the day.  Take them with a small sip of water.  They may give you some numbness to the base of your tongue or a metallic taste in your mouth, this is normal.  Use the Promethazine DM cough syrup at bedtime for cough and congestion.  It will make you drowsy so do not take it during the day.  Take the ibuprofen, 600 mg every 6 hours with food, on a schedule for the next 48 hours and then as needed.  Apply moist heat to your back for 30 minutes at a time 2-3 times a day to improve blood flow to the area and help remove the lactic acid causing the  spasm.  Return for reevaluation for any new or worsening symptoms.   Return for reevaluation or see your primary care provider for any new or worsening symptoms.      ED Prescriptions     Medication Sig Dispense Auth. Provider   benzonatate (TESSALON) 100 MG capsule Take 2 capsules (200 mg total) by mouth every 8 (eight) hours. 21 capsule Margarette Canada, NP   ipratropium (ATROVENT) 0.06 % nasal spray Place 2 sprays into both nostrils 4 (four) times daily. 15 mL Margarette Canada, NP   promethazine-dextromethorphan (PROMETHAZINE-DM) 6.25-15 MG/5ML syrup Take 5 mLs by mouth 4 (four) times daily as needed. 118 mL Margarette Canada, NP      PDMP not reviewed this encounter.   Margarette Canada, NP 07/11/21 1258

## 2021-07-13 ENCOUNTER — Other Ambulatory Visit: Payer: Self-pay

## 2021-07-13 ENCOUNTER — Ambulatory Visit
Admission: EM | Admit: 2021-07-13 | Discharge: 2021-07-13 | Disposition: A | Discharge status: Home / Self Care | Payer: Federal, State, Local not specified - PPO | Attending: Physician Assistant | Admitting: Physician Assistant

## 2021-07-13 ENCOUNTER — Encounter: Payer: Self-pay | Admitting: Licensed Clinical Social Worker

## 2021-07-13 ENCOUNTER — Ambulatory Visit (INDEPENDENT_AMBULATORY_CARE_PROVIDER_SITE_OTHER): Payer: Federal, State, Local not specified - PPO

## 2021-07-13 DIAGNOSIS — R5383 Other fatigue: Secondary | ICD-10-CM | POA: Diagnosis not present

## 2021-07-13 DIAGNOSIS — R059 Cough, unspecified: Secondary | ICD-10-CM | POA: Diagnosis not present

## 2021-07-13 DIAGNOSIS — R051 Acute cough: Secondary | ICD-10-CM

## 2021-07-13 DIAGNOSIS — B349 Viral infection, unspecified: Secondary | ICD-10-CM | POA: Diagnosis not present

## 2021-07-13 MED ORDER — HYDROCOD POLST-CPM POLST ER 10-8 MG/5ML PO SUER: 5.0000 mL | Freq: Two times a day (BID) | ORAL | 0 refills | Status: DC | PRN

## 2021-07-13 MED ORDER — PREDNISONE 20 MG PO TABS: 40.0000 mg | ORAL_TABLET | Freq: Every day | ORAL | 0 refills | Status: AC

## 2021-07-13 NOTE — ED Triage Notes (Signed)
Pt was here Saturday, coughing is worse and completed the medication that was given. Blowing out yellow phlegm.

## 2021-07-13 NOTE — Discharge Instructions (Addendum)
-  Your chest x-ray was normal.  No evidence of pneumonia. - You probably have a chest cold.  As we discussed this can take a couple weeks to get better. - I have sent prednisone which can help with inflammation and improve your symptoms.  Take it earlier in the day. - I have sent Tussionex which is a strong cough medication. - Try to increase rest. - If you have a fever, worsening cough, chest pain or increased shortness of breath you should be seen again.  Go to emergency department for any severe acute worsening of her symptoms.

## 2021-07-13 NOTE — ED Provider Notes (Signed)
[facility]    CSN: [id number] Arrival date & time: 07/13/21  1154      History   Chief Complaint Chief Complaint  Patient presents with   Cough    HPI Ahmoni Edge is a 40 y.o. female presenting for 1 week history of cough and nasal congestion.  Also reports fatigue and body aches.  Symptoms started with a sore throat and headache the sore throat has improved.  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  Patient reports the cough has been productive over the past couple of days and she is also occasionally blowing out yellowish phlegm although most the time the nasal drainage is clear.  Patient reports being seen at [facility] urgent care 2 days ago.  She says she is feeling worse in the past couple of days and not better.  Patient has had negative home COVID test.  She was not tested for anything when she was in the office the other day.  Patient has taken benzonatate and Promethazine DM as well as used Atrovent nasal spray.  States those medications did not help.  She says she also has codeine cough syrup at home which did not help.  Patient upset and a little tearful because she says that her cough is so bad that she is having some urinary incontinence.  Does not report any breathing difficulty or wheezing.  No chest pain.  No other complaints.  HPI  Past Medical History:  Diagnosis Date   Anemia    Endometriosis    Fibroid, uterine    Headache    History of Graves' disease    Hypothyroidism    Ovarian cyst    PCOS (polycystic ovarian syndrome)    PONV (postoperative nausea and vomiting)    NAUSEA   Thyroid disease     There are no problems to display for this patient.   Past Surgical History:  Procedure Laterality Date   DILATION AND EVACUATION N/A 11/22/2019   Procedure: DILATATION AND EVACUATION;  Surgeon: Marylynn Pearson, MD;  Location: [place];  Service: Gynecology;  Laterality:  N/A;   OVARIAN CYST REMOVAL     THYROIDECTOMY  2006   TOTAL DONE FOR GRAVES DISEASE   TONSILLECTOMY  AS CHILD    OB History     Gravida  1   Para      Term      Preterm      AB      Living         SAB      IAB      Ectopic      Multiple      Live Births               Home Medications    Prior to Admission medications   Medication Sig Start Date End Date Taking? Authorizing Provider  chlorpheniramine-HYDROcodone (TUSSIONEX PENNKINETIC ER) 10-8 MG/5ML SUER Take 5 mLs by mouth every 12 (twelve) hours as needed for cough. 07/13/21  Yes Danton Clap, PA-C  predniSONE (DELTASONE) 20 MG tablet Take 2 tablets (40 mg total) by mouth daily for 5 days. 07/13/21 07/18/21 Yes Danton Clap, PA-C  famotidine (PEPCID) 40 MG tablet Take 1 tablet (40 mg total) by mouth every evening for 7 days. 04/30/21 07/11/21  Lannie Fields, PA-C  ipratropium (ATROVENT) 0.06 % nasal spray Place 2 sprays into both nostrils 4 (  four) times daily. 07/11/21   Margarette Canada, NP  methylergonovine (METHERGINE) 0.2 MG tablet Take 1 tablet (0.2 mg total) by mouth 3 (three) times daily. 11/22/19   Marylynn Pearson, MD  Omega-3 Fatty Acids (FISH OIL) 1000 MG CAPS Take by mouth. 2 capsules daily    [provider]  Prenatal Vit-Fe Fumarate-FA (MULTIVITAMIN-PRENATAL) 27-0.8 MG TABS tablet Take 1 tablet by mouth daily at 12 noon.    [provider]  thyroid (ARMOUR) 60 MG tablet Take 60 mg by mouth 2 (two) times daily.     [provider]  UNABLE TO FIND Med Name: prometrium suppository 50 mg at hs rectally    [provider]    Family History History reviewed. No pertinent family history.  Social History Social History   Tobacco Use   Smoking status: Never   Smokeless tobacco: Never  Vaping Use   Vaping Use: Never used  Substance Use Topics   Alcohol use: No   Drug use: No     Allergies   Lactose intolerance (gi) and Metformin and related   Review  of Systems Review of Systems  Constitutional:  Positive for fatigue. Negative for chills, diaphoresis and fever.  HENT:  Positive for congestion, rhinorrhea and sinus pressure. Negative for ear pain, sinus pain and sore throat.   Respiratory:  Positive for cough. Negative for shortness of breath and wheezing.   Cardiovascular:  Negative for chest pain.  Gastrointestinal:  Negative for abdominal pain, nausea and vomiting.  Musculoskeletal:  Negative for arthralgias and myalgias.  Skin:  Negative for rash.  Neurological:  Positive for headaches. Negative for weakness.  Hematological:  Negative for adenopathy.  Psychiatric/Behavioral:  Positive for sleep disturbance.     Physical Exam Triage Vital Signs ED Triage Vitals  Enc Vitals Group     BP 07/13/21 1225 (!) 151/107     Pulse Rate 07/13/21 1225 (!) 124     Resp 07/13/21 1225 16     Temp 07/13/21 1225 98.7 F (37.1 C)     Temp src --      SpO2 07/13/21 1225 97 %     Weight 07/13/21 1223 225 lb 1.4 oz (102.1 kg)     Height 07/13/21 1223 5\' 5"  (1.651 m)     Head Circumference --      Peak Flow --      Pain Score 07/13/21 1223 4     Pain Loc --      Pain Edu? --      Excl. in [place]? --    No data found.  Updated Vital Signs BP (!) 151/107 (BP Location: Right Arm)   Pulse (!) 124   Temp 98.7 F (37.1 C)   Resp 16   Ht 5\' 5"  (1.651 m)   Wt 225 lb 1.4 oz (102.1 kg)   SpO2 97%   BMI 37.46 kg/m    Physical Exam Vitals and nursing note reviewed.  Constitutional:      General: She is not in acute distress.    Appearance: Normal appearance. She is ill-appearing. She is not toxic-appearing.  HENT:     Head: Normocephalic and atraumatic.     Right Ear: Tympanic membrane, ear canal and external ear normal.     Left Ear: Tympanic membrane, ear canal and external ear normal.     Nose: Congestion and rhinorrhea present.     Mouth/Throat:     Mouth: Mucous membranes are moist.     Pharynx: Oropharynx  is clear. Posterior  oropharyngeal erythema (mild) present.  Eyes:     General: No scleral icterus.       Right eye: No discharge.        Left eye: No discharge.     Conjunctiva/sclera: Conjunctivae normal.  Cardiovascular:     Rate and Rhythm: Normal rate. Rhythm irregular.     Heart sounds: Normal heart sounds.  Pulmonary:     Effort: Pulmonary effort is normal. No respiratory distress.     Breath sounds: Normal breath sounds.  Musculoskeletal:     Cervical back: Neck supple.  Skin:    General: Skin is dry.  Neurological:     General: No focal deficit present.     Mental Status: She is alert. Mental status is at baseline.     Motor: No weakness.     Gait: Gait normal.  Psychiatric:        Mood and Affect: Mood normal.        Behavior: Behavior normal.        Thought Content: Thought content normal.     UC Treatments / Results  Labs (all labs ordered are listed, but only abnormal results are displayed) Labs Reviewed - No data to display  EKG   Radiology DG Chest 2 View  Result Date: 07/13/2021 CLINICAL DATA:  Cough for 1 week EXAM: CHEST - 2 VIEW COMPARISON:  09/29/2016 FINDINGS: The heart size and mediastinal contours are within normal limits. No focal airspace consolidation, pleural effusion, or pneumothorax. The visualized skeletal structures are unremarkable. IMPRESSION: No active cardiopulmonary disease. Electronically Signed   By: Davina Poke D.O.   On: 07/13/2021 12:46    Procedures Procedures (including critical care time)  Medications Ordered in UC Medications - No data to display  Initial Impression / Assessment and Plan / UC Course  I have reviewed the triage vital signs and the nursing notes.  Pertinent labs & imaging results that were available during my care of the patient were reviewed by me and considered in my medical decision making (see chart for details).   40 year old female presenting for 1 week history of cough and congestion.  Patient reports cough is  productive.  Reports feeling worse in the past 2 days.  Was seen by a colleague 2 days ago.  Patient was not retested for COVID-19 since she had negative home test.  Does not desire to be tested today.  BP is elevated at 151/107 and pulse elevated at 124.  Patient mildly anxious and tearful when discussing her incontinence issues due to the cough.  On exam she has nasal congestion and clearish rhinorrhea as well as mild posterior pharyngeal erythema.  Chest clear to auscultation.  Chest x-ray obtained today to assess for possible pneumonia.  Chest x-ray is normal.  I discussed this with patient.  Advised her that she likely has a viral illness and supportive care is encouraged.  She would like to try steroids I have sent prednisone.  Additionally we discussed options for different cough medications but she says the codeine cough medication has not helped and neither have the prescription ones or over-the-counter cough medicines.  Discussed treating with Tussionex but advised very close monitoring and to use only if needed.  Patient agreeable to this plan.  I did review controlled substance database and find her to be low risk for abuse.  Advised that she should increase rest and fluids.  Advised following up for fever, worsening cough, breathing difficulty, weakness or if  feeling worse.  Reassured patient that she should be getting better throughout the next week but symptoms can linger for a couple of weeks.  Work note given.  Final Clinical Impressions(s) / UC Diagnoses   Final diagnoses:  Viral illness  Acute cough  Other fatigue     Discharge Instructions      -Your chest x-ray was normal.  No evidence of pneumonia. - You probably have a chest cold.  As we discussed this can take a couple weeks to get better. - I have sent prednisone which can help with inflammation and improve your symptoms.  Take it earlier in the day. - I have sent Tussionex which is a strong cough medication. - Try to  increase rest. - If you have a fever, worsening cough, chest pain or increased shortness of breath you should be seen again.  Go to emergency department for any severe acute worsening of her symptoms.     ED Prescriptions     Medication Sig Dispense Auth. Provider   predniSONE (DELTASONE) 20 MG tablet Take 2 tablets (40 mg total) by mouth daily for 5 days. 10 tablet Danton Clap, PA-C   chlorpheniramine-HYDROcodone (TUSSIONEX PENNKINETIC ER) 10-8 MG/5ML SUER Take 5 mLs by mouth every 12 (twelve) hours as needed for cough. 70 mL Danton Clap, PA-C      I have reviewed the PDMP during this encounter.   Danton Clap, PA-C 07/13/21 1344

## 2021-08-27 IMAGING — US US BREAST*R* LIMITED INC AXILLA
1 series · 12 of 15 positions shown · non-contrast
Comparison: Previous exam(s).

CLINICAL DATA: Recall from baseline screening mammography with
tomosynthesis, possible mass involving the INNER subareolar RIGHT
breast and a possible asymmetry involving the INNER RIGHT breast at
POSTERIOR depth visible only on the CC images.

EXAM:
DIGITAL DIAGNOSTIC RIGHT MAMMOGRAM WITH CAD AND TOMO
ULTRASOUND RIGHT BREAST

[Series 1: us breast*right* limited inc axilla · 0.06mm/px · 12 of 15 slices shown]
[im 1/15]
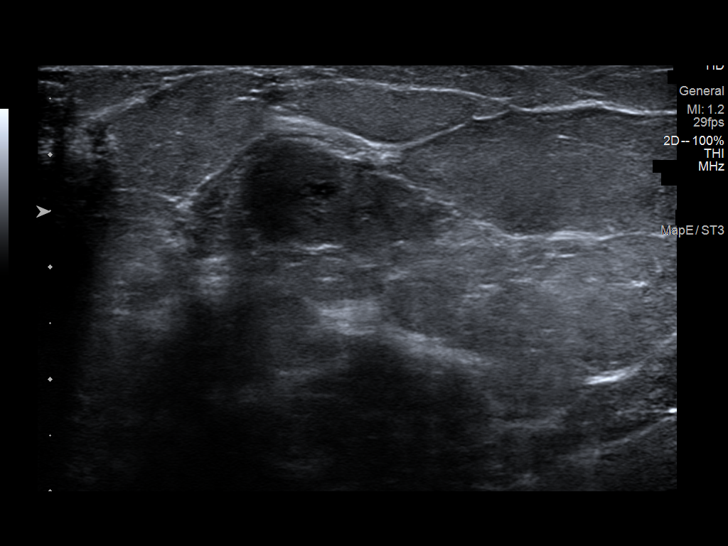
[im 2/15]
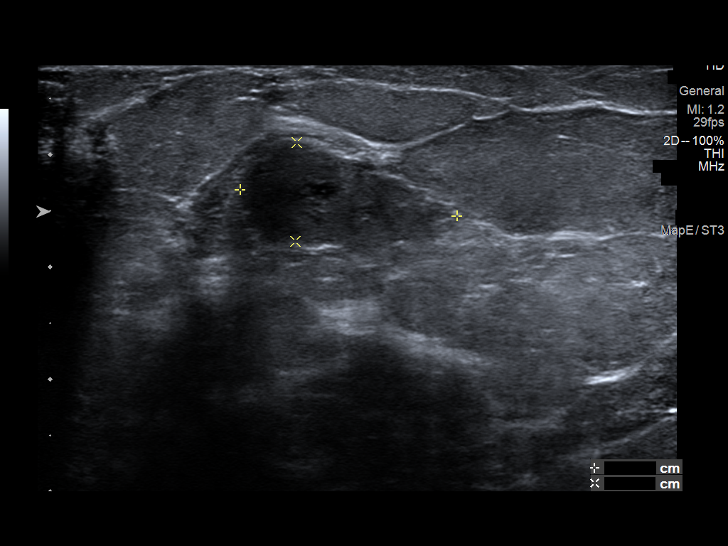
[im 4/15]
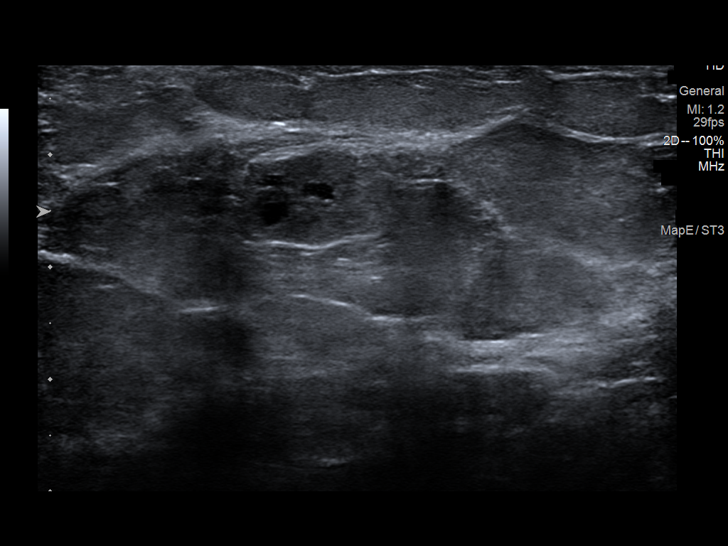
[im 5/15]
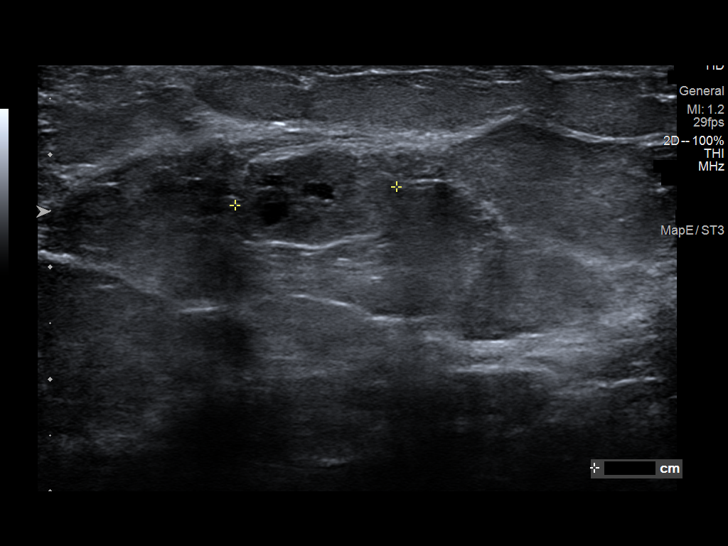
[im 6/15]
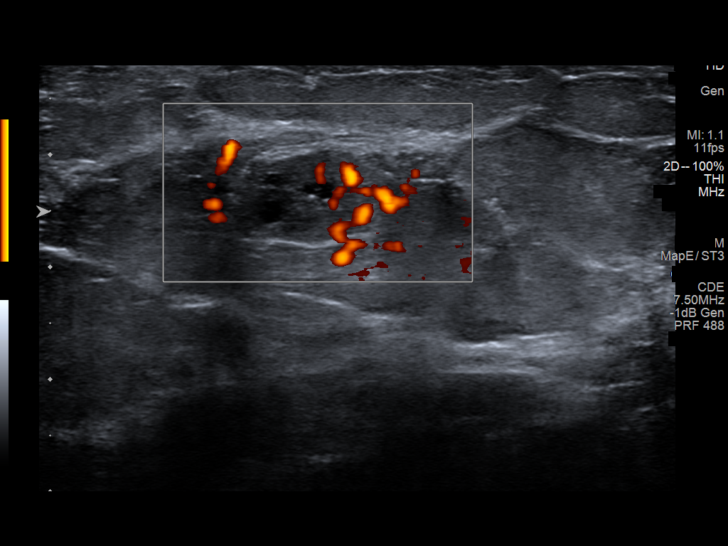
[im 7/15]
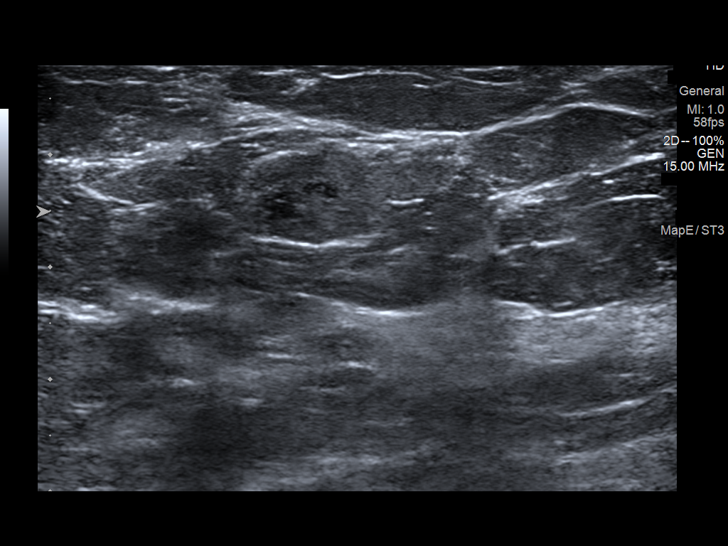
[im 9/15]
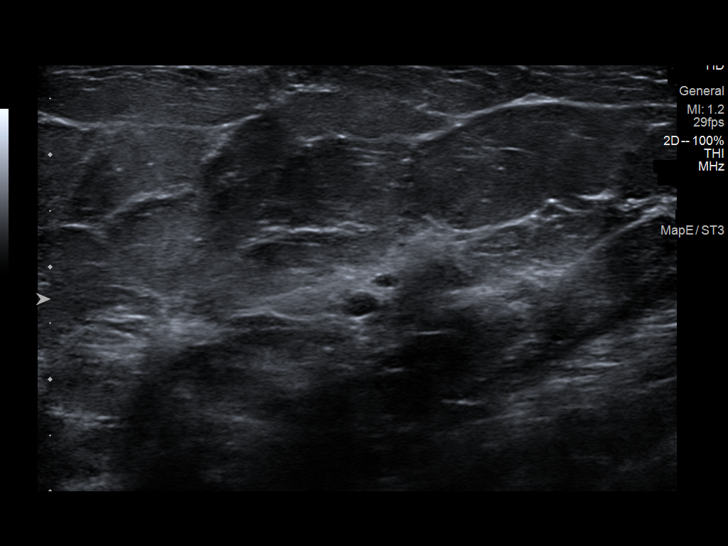
[im 10/15]
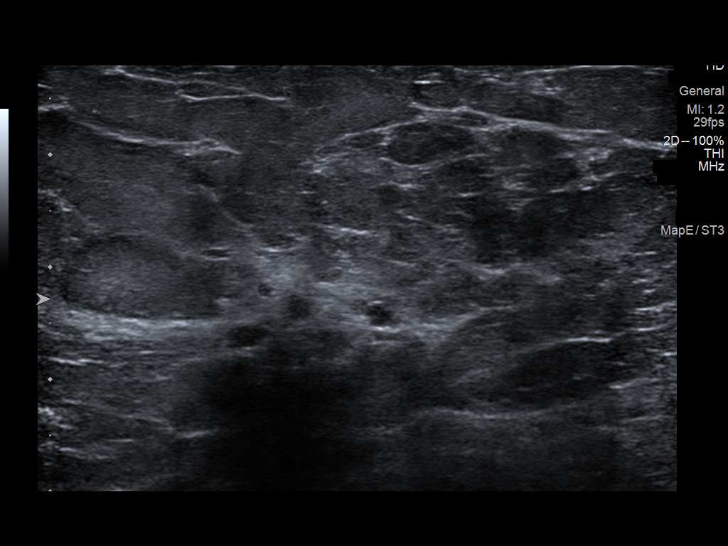
[im 11/15]
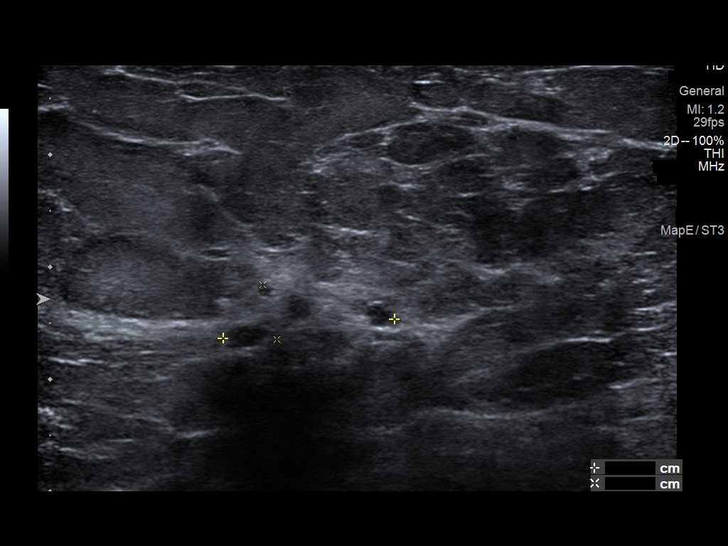
[im 12/15]
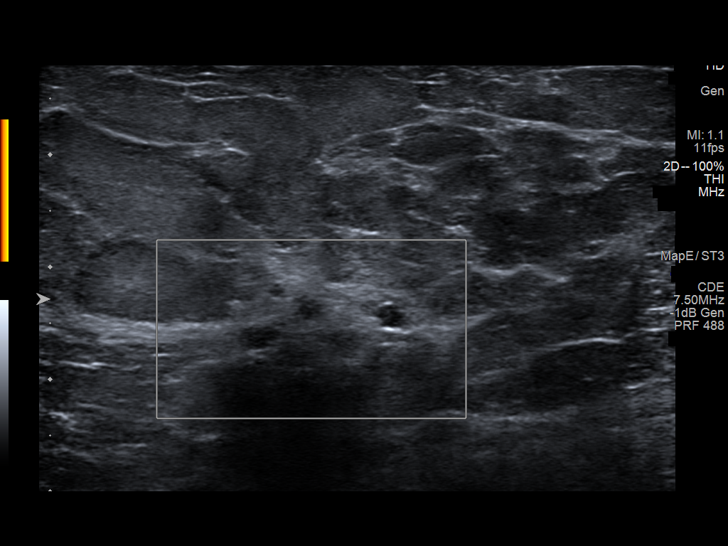
[im 14/15]
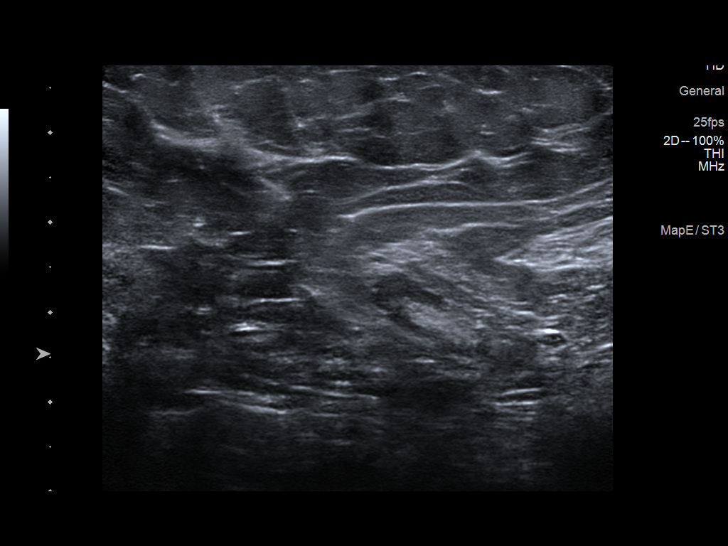
[im 15/15]
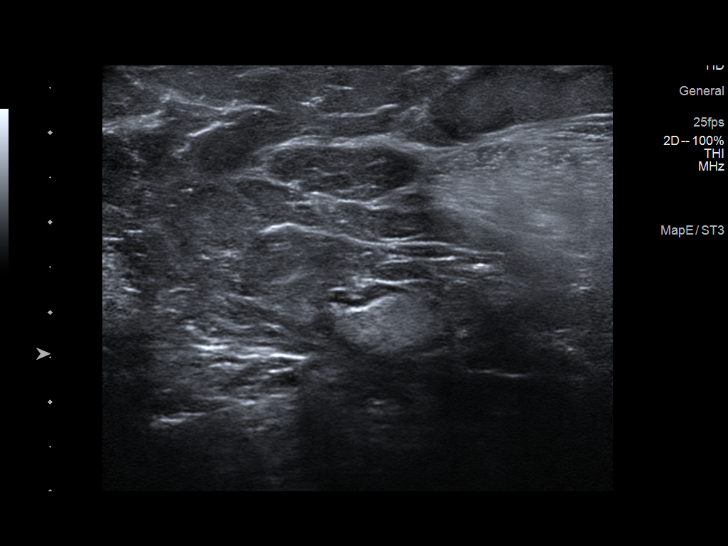

[12 of 15 positions shown; findings below may reference images not displayed]

ACR Breast Density Category b: There are scattered areas of
fibroglandular density.
FINDINGS: Tomosynthesis and synthesized spot compression CC and MLO views of
the possible mass in the INNER subareolar location, tomosynthesis
and synthesized spot compression CC view of the possible asymmetry
in the INNER breast at POSTERIOR depth, and a tomosynthesis and
synthesized full field mediolateral of RIGHT breast obtained.

Spot compression images confirm an approximate 2 cm isodense to
hyperdense mass in the INNER subareolar location with irregular
margins. There is no associated architectural distortion or
suspicious calcifications.

The asymmetry questioned on screening mammography persists though
disperses with compression. There may be an approximate 0.6 cm
isodense mass with interspersed fat in this location which localizes
to the slight UPPER breast on the full field mediolateral images.
There is no associated architectural distortion or suspicious
calcifications.

The full field mediolateral image was processed with CAD.

Targeted RIGHT breast ultrasound is performed, showing an isodense
mass with internal cystic spaces at the 2 o'clock position
approximately 1 cm from the nipple at ANTERIOR depth, measuring
approximately 2.5 x 0.9 x 1.5 cm, demonstrating mixed posterior
characteristics and demonstrating internal power Doppler flow,
corresponding to the screening mammographic finding.

At the 2 o'clock position approximately 4 cm from the nipple at
POSTERIOR depth are scattered benign cysts spanning in total
approximately 1.5 x 0.5 x 1.3 cm. The screening mammographic finding
is part of these scattered cysts.

Sonographic evaluation of the RIGHT axilla demonstrates no
pathologic.
IMPRESSION: 1. Indeterminate approximate 2.5 cm mass involving UPPER INNER
QUADRANT of the RIGHT breast at ANTERIOR depth accounting for the
screening mammographic finding.
2. Benign cysts in the UPPER INNER QUADRANT of the RIGHT breast at
POSTERIOR depth which accounts for the screening mammographic
asymmetry.
3. No pathologic RIGHT axillary lymphadenopathy.

RECOMMENDATION:
Ultrasound-guided core needle biopsy of the mass in UPPER OUTER
RIGHT breast at ANTERIOR depth. While this most likely represents a
degenerating fibroadenoma with internal cystic spaces, the somewhat
irregular margins of the mass on mammography warrants tissue
sampling.

The ultrasound core needle biopsy procedure was discussed with
patient and her questions were answered. She wishes to proceed and
the biopsy has been scheduled at her convenience.

I have discussed the findings and recommendations with the patient.

BI-RADS CATEGORY  4: Suspicious.

## 2021-08-27 IMAGING — MG MM DIGITAL DIAGNOSTIC UNILAT*R* W/ TOMO W/ CAD
8 series · 8 of 24 positions shown · non-contrast
Comparison: Previous exam(s).

CLINICAL DATA: Recall from baseline screening mammography with
tomosynthesis, possible mass involving the INNER subareolar RIGHT
breast and a possible asymmetry involving the INNER RIGHT breast at
POSTERIOR depth visible only on the CC images.

EXAM:
DIGITAL DIAGNOSTIC RIGHT MAMMOGRAM WITH CAD AND TOMO
ULTRASOUND RIGHT BREAST

[R MLO synth-2D]
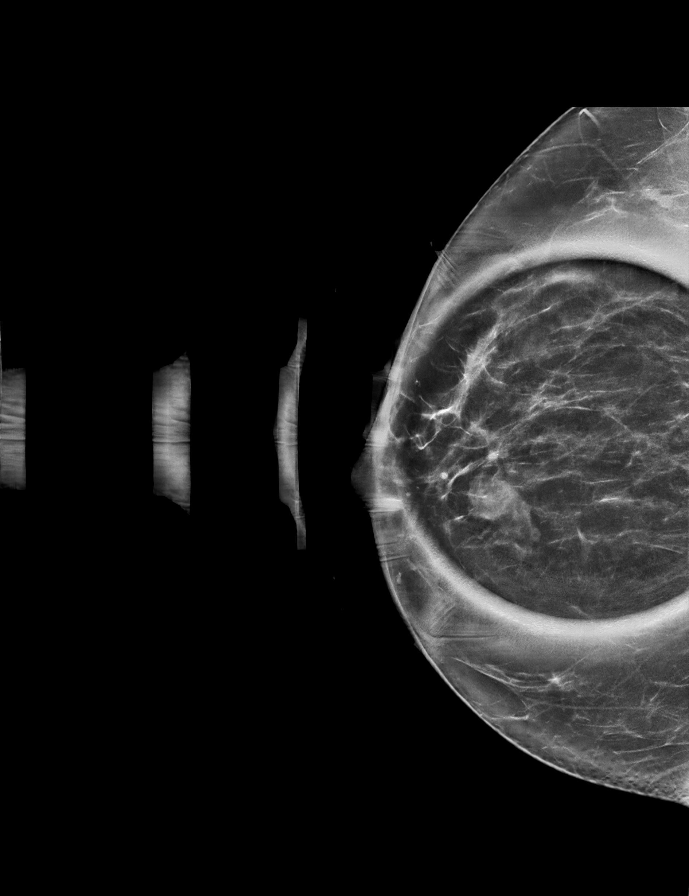

[R CC synth-2D (1 of 2)]
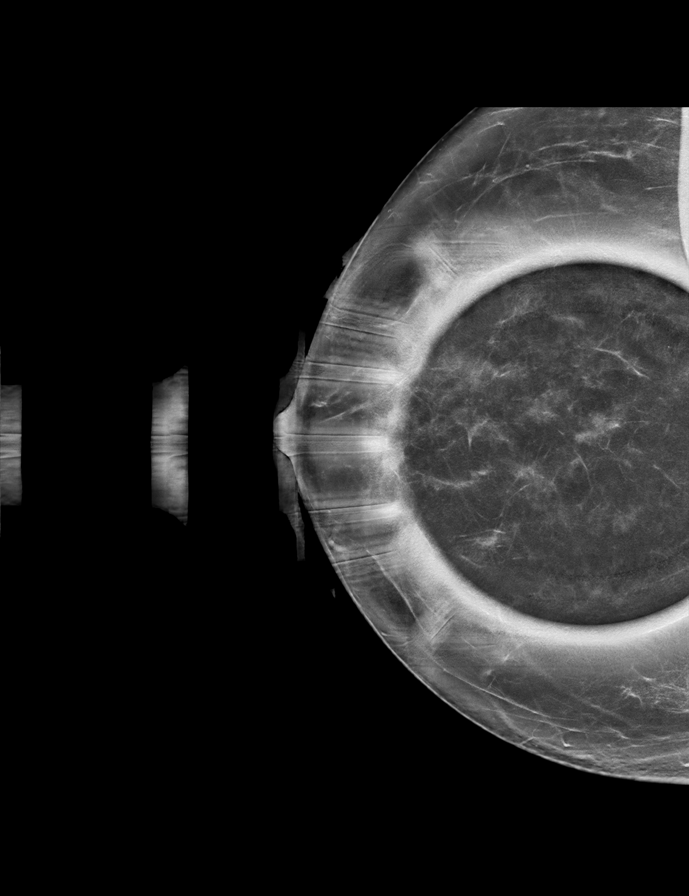

[R CC synth-2D (2 of 2)]
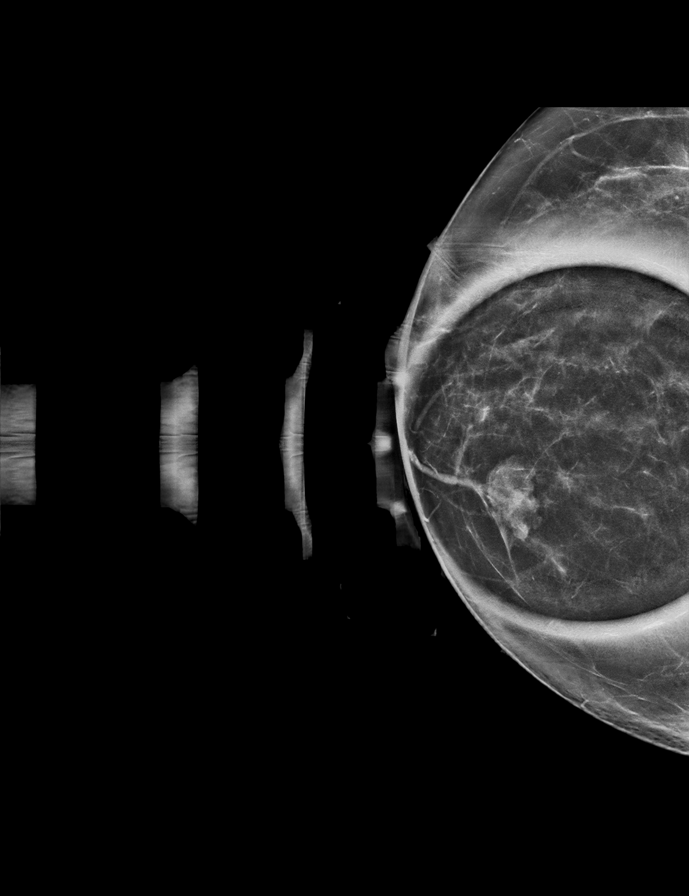

[R ML synth-2D]
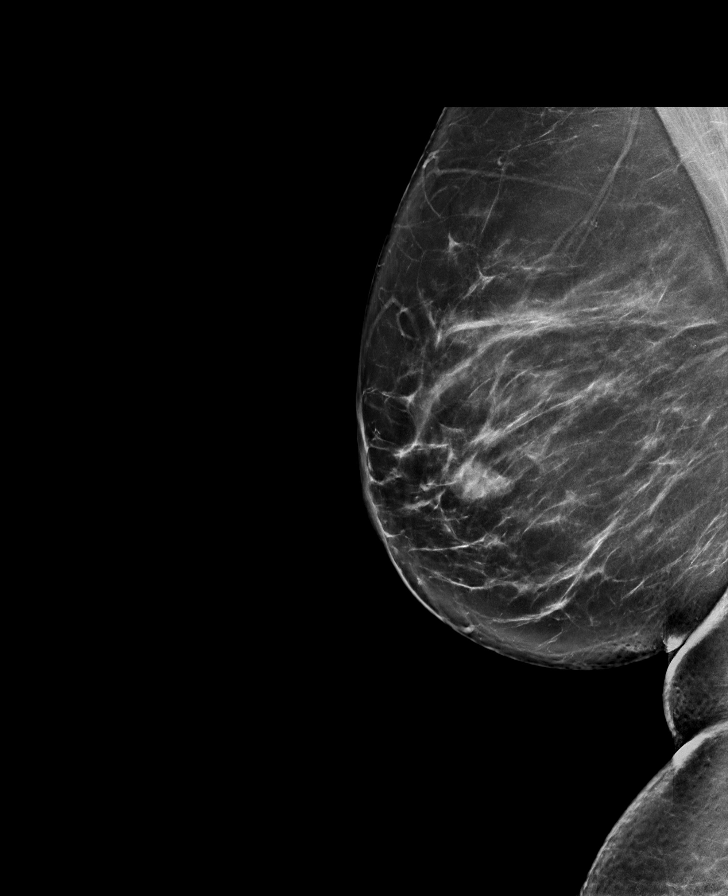

[R MLO tomo · tomo slice 38/75.0]
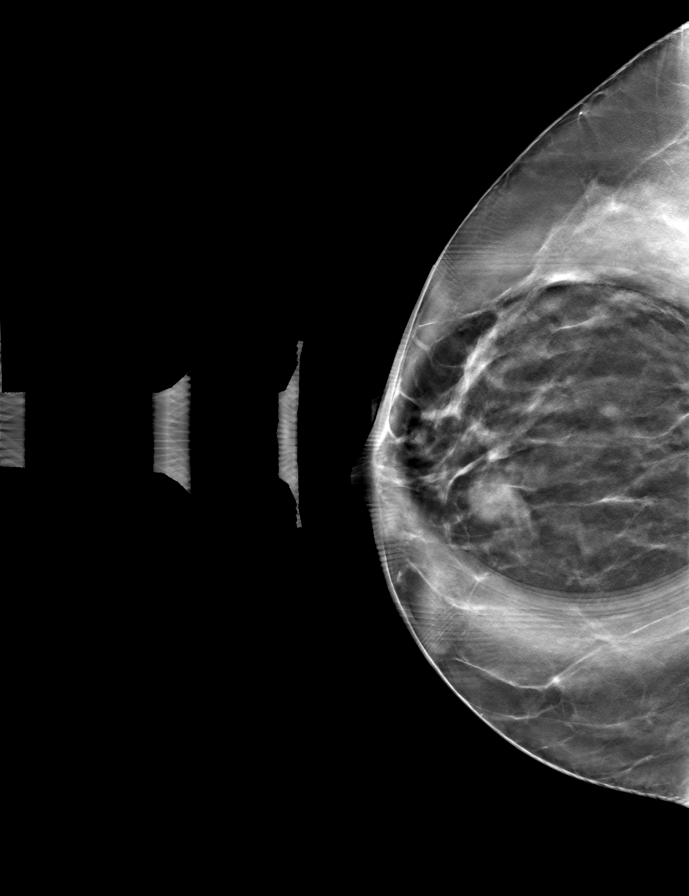

[R ML tomo · tomo slice 47/94.0]
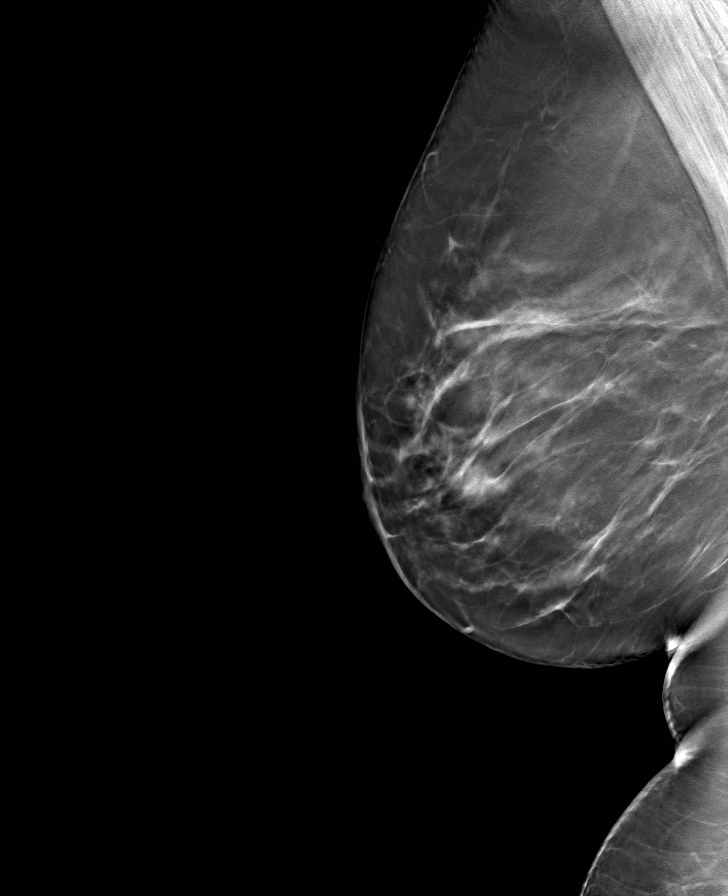

[R CC tomo (1 of 2) · tomo slice 39/78.0]
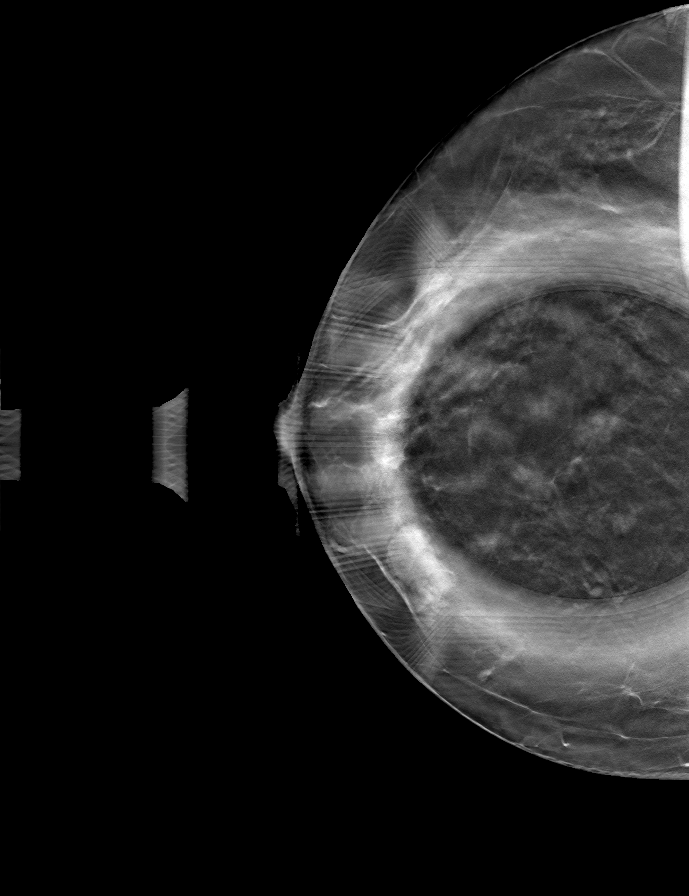

[R CC tomo (2 of 2) · tomo slice 34/67.0]
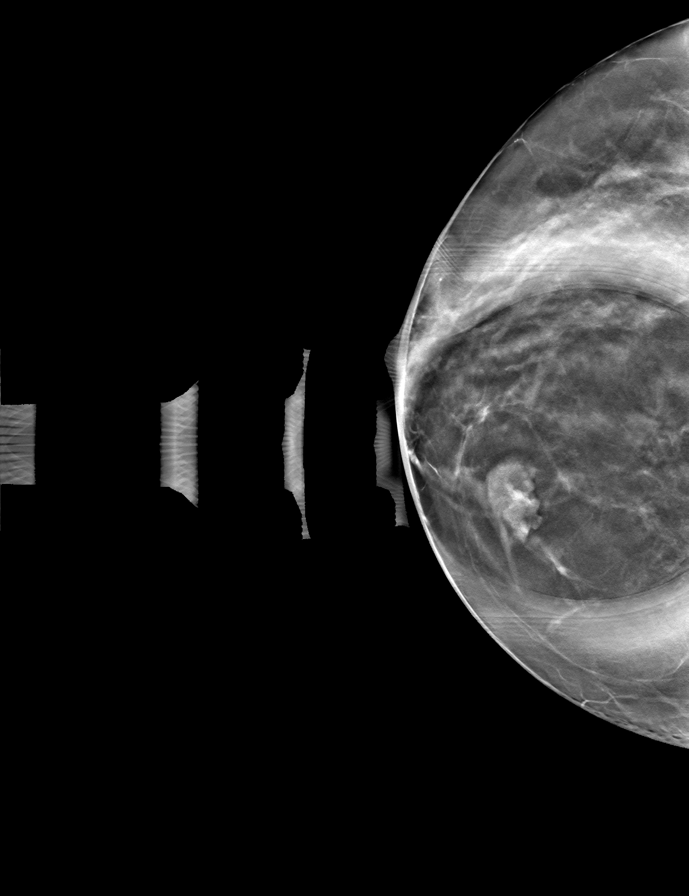

[8 of 24 positions shown; findings below may reference images not displayed]

ACR Breast Density Category b: There are scattered areas of
fibroglandular density.
FINDINGS: Tomosynthesis and synthesized spot compression CC and MLO views of
the possible mass in the INNER subareolar location, tomosynthesis
and synthesized spot compression CC view of the possible asymmetry
in the INNER breast at POSTERIOR depth, and a tomosynthesis and
synthesized full field mediolateral of RIGHT breast obtained.

Spot compression images confirm an approximate 2 cm isodense to
hyperdense mass in the INNER subareolar location with irregular
margins. There is no associated architectural distortion or
suspicious calcifications.

The asymmetry questioned on screening mammography persists though
disperses with compression. There may be an approximate 0.6 cm
isodense mass with interspersed fat in this location which localizes
to the slight UPPER breast on the full field mediolateral images.
There is no associated architectural distortion or suspicious
calcifications.

The full field mediolateral image was processed with CAD.

Targeted RIGHT breast ultrasound is performed, showing an isodense
mass with internal cystic spaces at the 2 o'clock position
approximately 1 cm from the nipple at ANTERIOR depth, measuring
approximately 2.5 x 0.9 x 1.5 cm, demonstrating mixed posterior
characteristics and demonstrating internal power Doppler flow,
corresponding to the screening mammographic finding.

At the 2 o'clock position approximately 4 cm from the nipple at
POSTERIOR depth are scattered benign cysts spanning in total
approximately 1.5 x 0.5 x 1.3 cm. The screening mammographic finding
is part of these scattered cysts.

Sonographic evaluation of the RIGHT axilla demonstrates no
pathologic.
IMPRESSION: 1. Indeterminate approximate 2.5 cm mass involving UPPER INNER
QUADRANT of the RIGHT breast at ANTERIOR depth accounting for the
screening mammographic finding.
2. Benign cysts in the UPPER INNER QUADRANT of the RIGHT breast at
POSTERIOR depth which accounts for the screening mammographic
asymmetry.
3. No pathologic RIGHT axillary lymphadenopathy.

RECOMMENDATION:
Ultrasound-guided core needle biopsy of the mass in UPPER OUTER
RIGHT breast at ANTERIOR depth. While this most likely represents a
degenerating fibroadenoma with internal cystic spaces, the somewhat
irregular margins of the mass on mammography warrants tissue
sampling.

The ultrasound core needle biopsy procedure was discussed with
patient and her questions were answered. She wishes to proceed and
the biopsy has been scheduled at her convenience.

I have discussed the findings and recommendations with the patient.

BI-RADS CATEGORY  4: Suspicious.

## 2021-09-17 IMAGING — MG MM BREAST LOCALIZATION CLIP
4 series · 4 of 12 positions shown · non-contrast
Comparison: Previous exam(s).

CLINICAL DATA: Patient status post ultrasound-guided core needle
biopsy right breast mass.

EXAM:
DIAGNOSTIC RIGHT MAMMOGRAM POST ULTRASOUND BIOPSY

[R CC synth-2D]
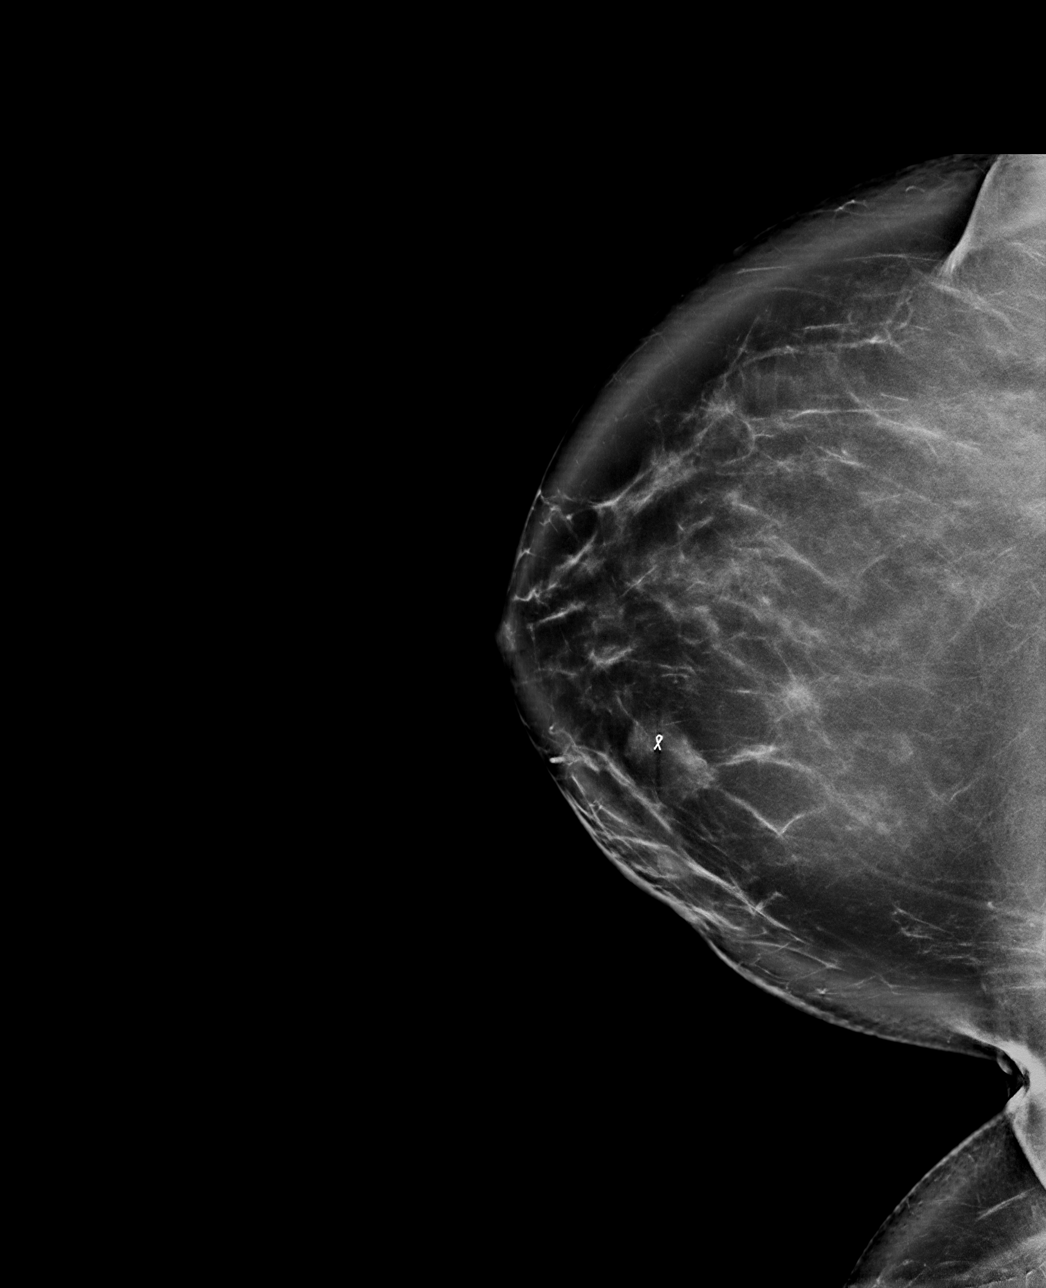

[R ML synth-2D]
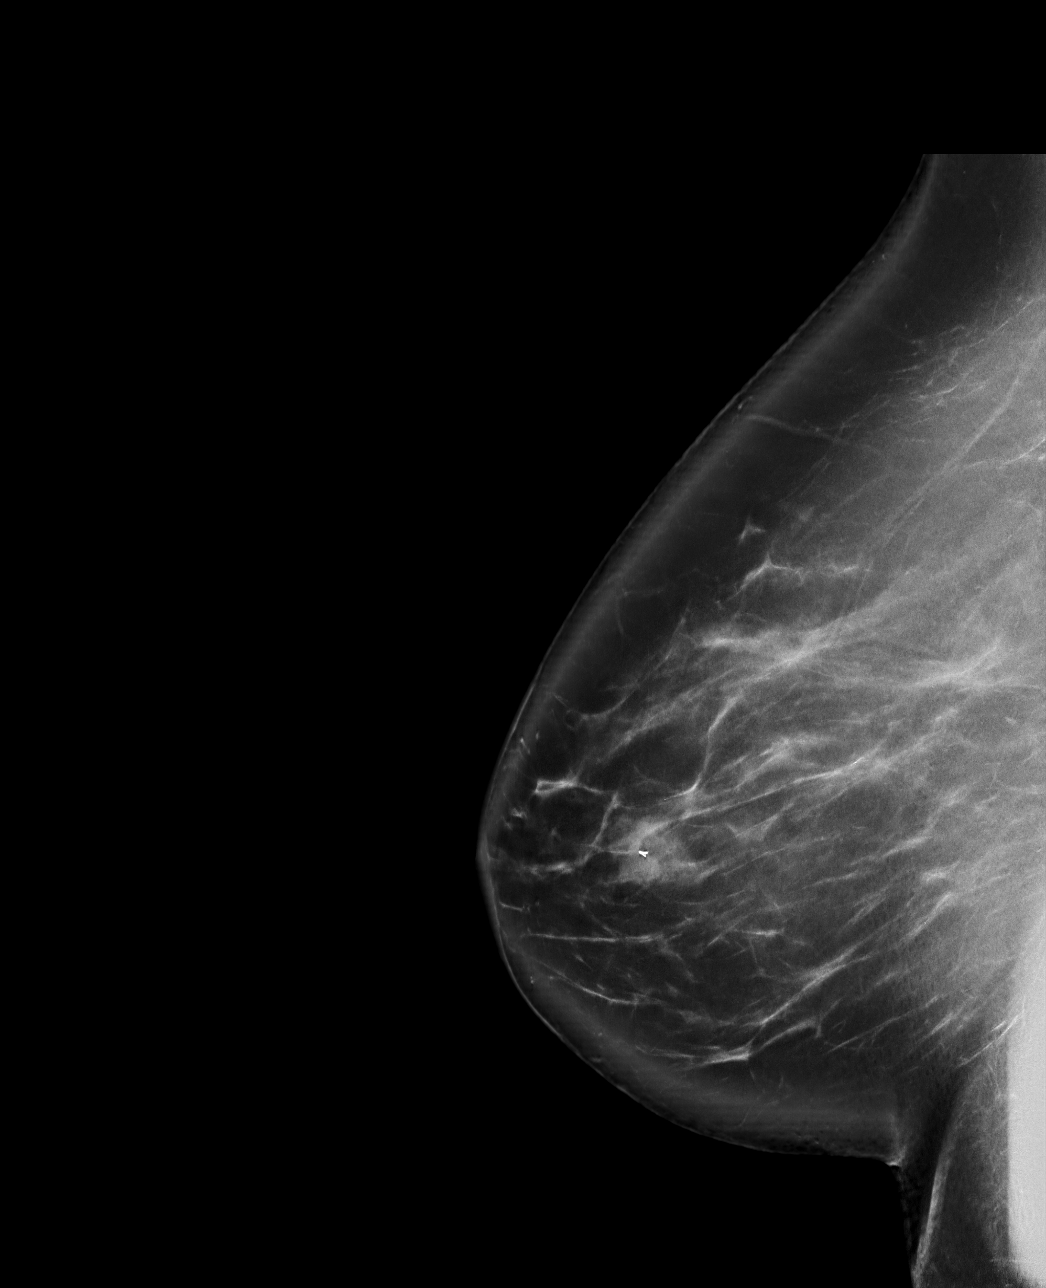

[R ML tomo · tomo slice 61/120.0]
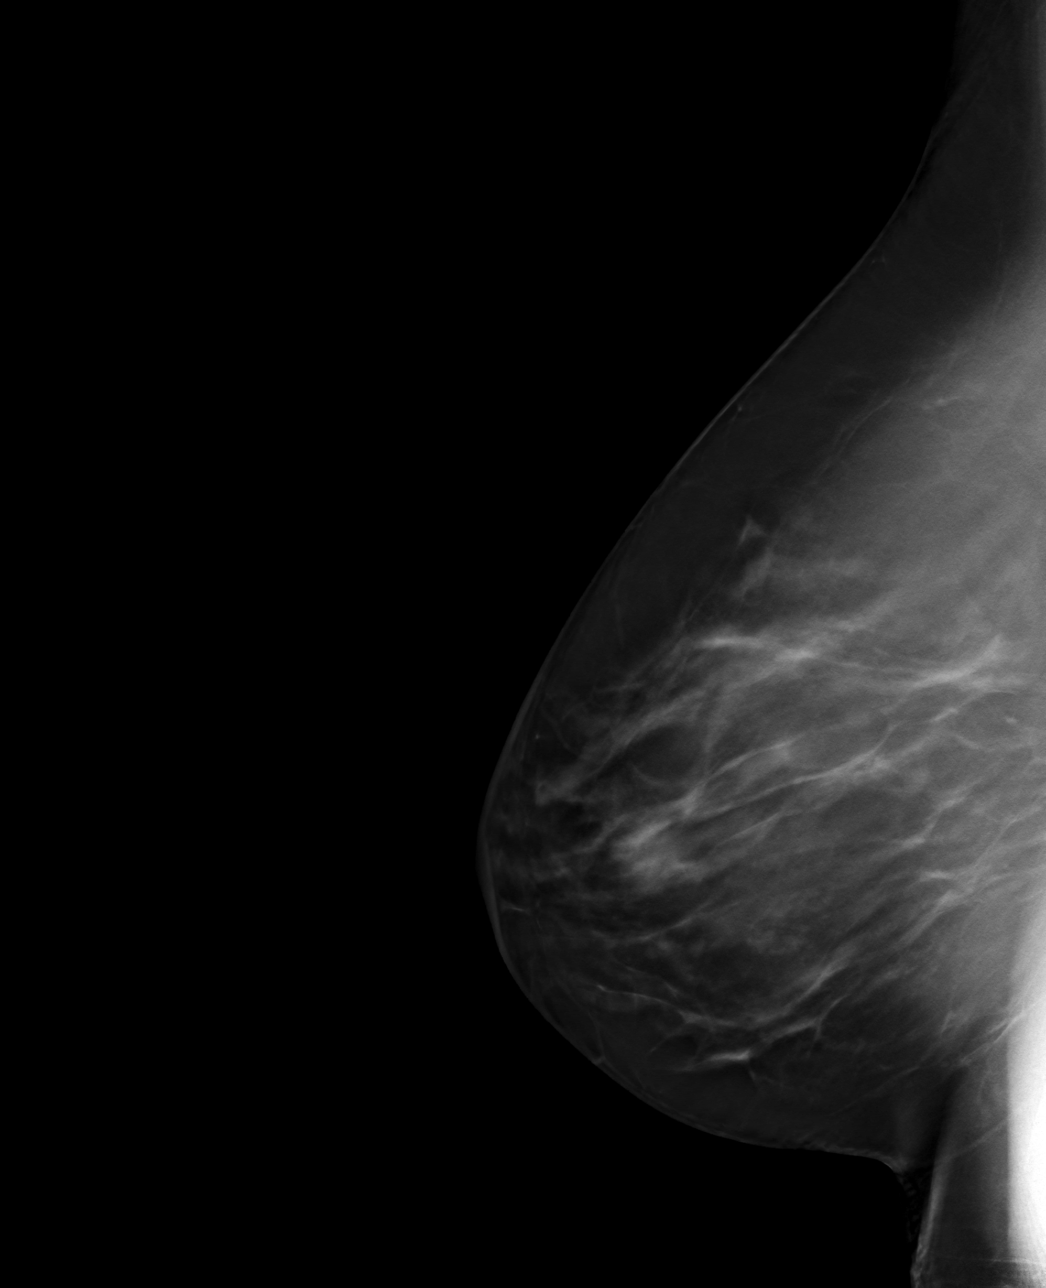

[R CC tomo · tomo slice 55/109.0]
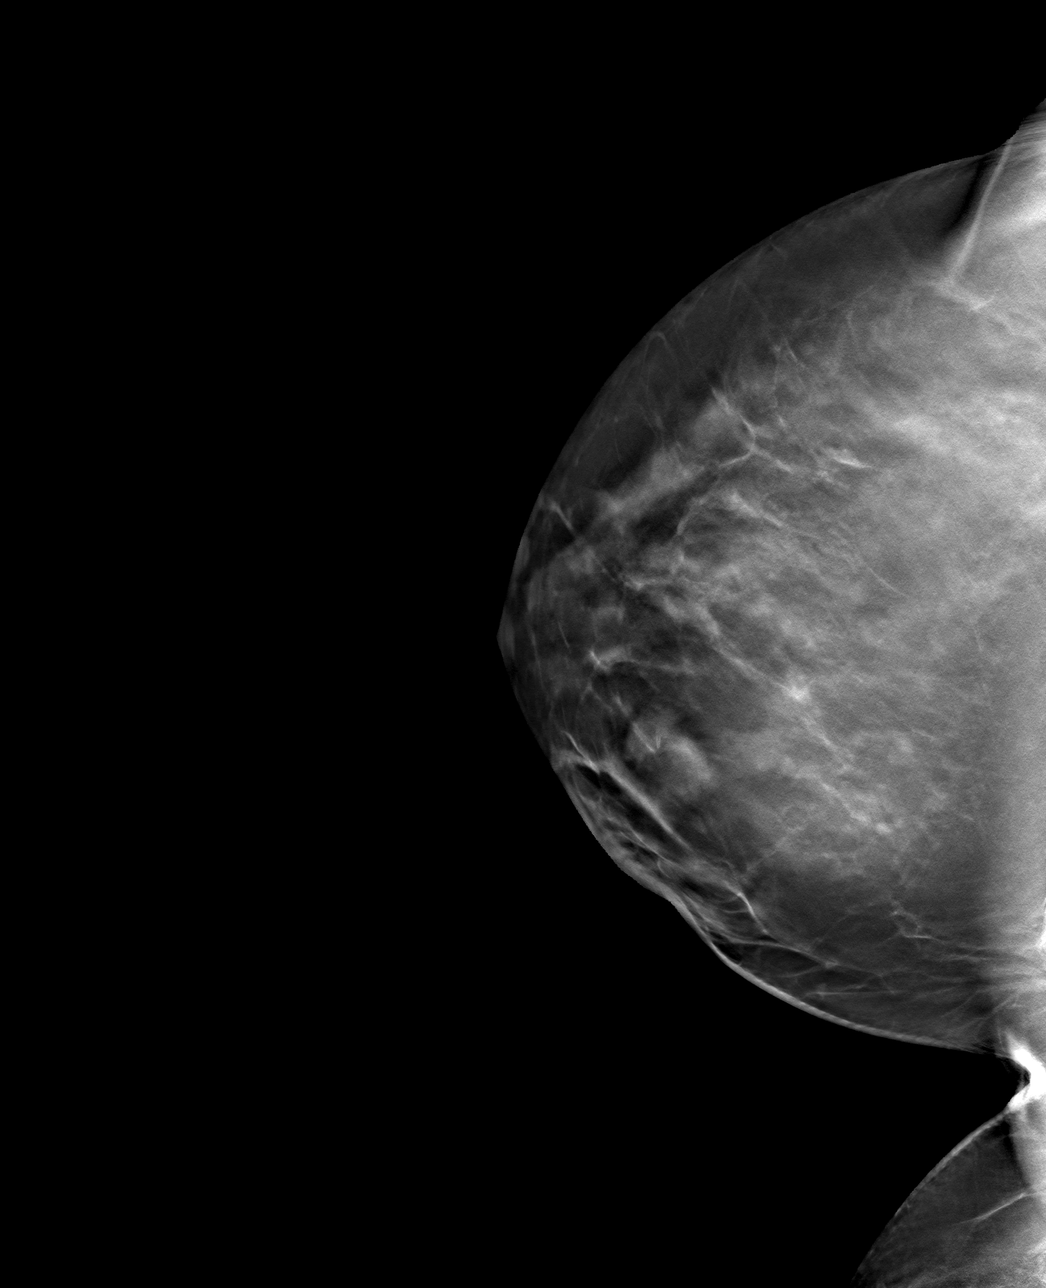

[4 of 12 positions shown; findings below may reference images not displayed]

FINDINGS: Mammographic images were obtained following ultrasound guided biopsy
of right breast mass 2 o'clock position. The biopsy marking clip is
in expected position at the site of biopsy.
IMPRESSION: Appropriate positioning of the ribbon shaped biopsy marking clip at
the site of biopsy in the right breast mass.

Final Assessment: Post Procedure Mammograms for Marker Placement

## 2022-01-19 DIAGNOSIS — E559 Vitamin D deficiency, unspecified: Secondary | ICD-10-CM | POA: Diagnosis not present

## 2022-01-19 DIAGNOSIS — R5383 Other fatigue: Secondary | ICD-10-CM | POA: Diagnosis not present

## 2022-01-19 DIAGNOSIS — M2550 Pain in unspecified joint: Secondary | ICD-10-CM | POA: Diagnosis not present

## 2022-01-19 DIAGNOSIS — N809 Endometriosis, unspecified: Secondary | ICD-10-CM | POA: Diagnosis not present

## 2022-01-19 DIAGNOSIS — R000 Tachycardia, unspecified: Secondary | ICD-10-CM | POA: Diagnosis not present

## 2022-01-19 DIAGNOSIS — E039 Hypothyroidism, unspecified: Secondary | ICD-10-CM | POA: Diagnosis not present

## 2022-01-19 DIAGNOSIS — E7210 Disorders of sulfur-bearing amino-acid metabolism, unspecified: Secondary | ICD-10-CM | POA: Diagnosis not present

## 2022-02-02 DIAGNOSIS — E039 Hypothyroidism, unspecified: Secondary | ICD-10-CM | POA: Diagnosis not present

## 2022-02-02 DIAGNOSIS — E7210 Disorders of sulfur-bearing amino-acid metabolism, unspecified: Secondary | ICD-10-CM | POA: Diagnosis not present

## 2022-02-02 DIAGNOSIS — N809 Endometriosis, unspecified: Secondary | ICD-10-CM | POA: Diagnosis not present

## 2022-02-02 DIAGNOSIS — R5383 Other fatigue: Secondary | ICD-10-CM | POA: Diagnosis not present

## 2022-07-03 DIAGNOSIS — J029 Acute pharyngitis, unspecified: Secondary | ICD-10-CM | POA: Diagnosis not present

## 2022-07-03 DIAGNOSIS — Z6833 Body mass index (BMI) 33.0-33.9, adult: Secondary | ICD-10-CM | POA: Diagnosis not present

## 2022-10-28 DIAGNOSIS — H15101 Unspecified episcleritis, right eye: Secondary | ICD-10-CM | POA: Diagnosis not present

## 2022-10-30 DIAGNOSIS — I10 Essential (primary) hypertension: Secondary | ICD-10-CM | POA: Diagnosis not present

## 2022-10-30 DIAGNOSIS — R110 Nausea: Secondary | ICD-10-CM | POA: Diagnosis not present

## 2022-10-30 DIAGNOSIS — N9489 Other specified conditions associated with female genital organs and menstrual cycle: Secondary | ICD-10-CM | POA: Diagnosis not present

## 2022-10-30 DIAGNOSIS — Z888 Allergy status to other drugs, medicaments and biological substances status: Secondary | ICD-10-CM | POA: Diagnosis not present

## 2022-10-30 DIAGNOSIS — R1030 Lower abdominal pain, unspecified: Secondary | ICD-10-CM | POA: Diagnosis not present

## 2022-10-30 DIAGNOSIS — D259 Leiomyoma of uterus, unspecified: Secondary | ICD-10-CM | POA: Diagnosis not present

## 2022-10-30 DIAGNOSIS — E079 Disorder of thyroid, unspecified: Secondary | ICD-10-CM | POA: Diagnosis not present

## 2022-10-30 DIAGNOSIS — R61 Generalized hyperhidrosis: Secondary | ICD-10-CM | POA: Diagnosis not present

## 2022-10-30 DIAGNOSIS — N838 Other noninflammatory disorders of ovary, fallopian tube and broad ligament: Secondary | ICD-10-CM | POA: Diagnosis not present

## 2022-10-30 IMAGING — US US PELVIS COMPLETE TRANSABD/TRANSVAG W DUPLEX
1 series · 13 of 25 positions shown · non-contrast
Comparison: CT earlier today.  Pelvic ultrasound 10/22/2017

CLINICAL DATA: Abdominal discomfort.  Left ovarian cyst on CT.

EXAM:
TRANSABDOMINAL AND TRANSVAGINAL ULTRASOUND OF PELVIS
DOPPLER ULTRASOUND OF OVARIES
TECHNIQUE: Both transabdominal and transvaginal ultrasound examinations of the
pelvis were performed. Transabdominal technique was performed for
global imaging of the pelvis including uterus, ovaries, adnexal
regions, and pelvic cul-de-sac.
It was necessary to proceed with endovaginal exam following the
transabdominal exam to visualize the uterus ovaries and adnexa.
Color and duplex Doppler ultrasound was utilized to evaluate blood
flow to the ovaries.

[Series 1: us pelvic complete w transvaginal and torsion righ · 13 of 89 slices shown]
[im 1/89]
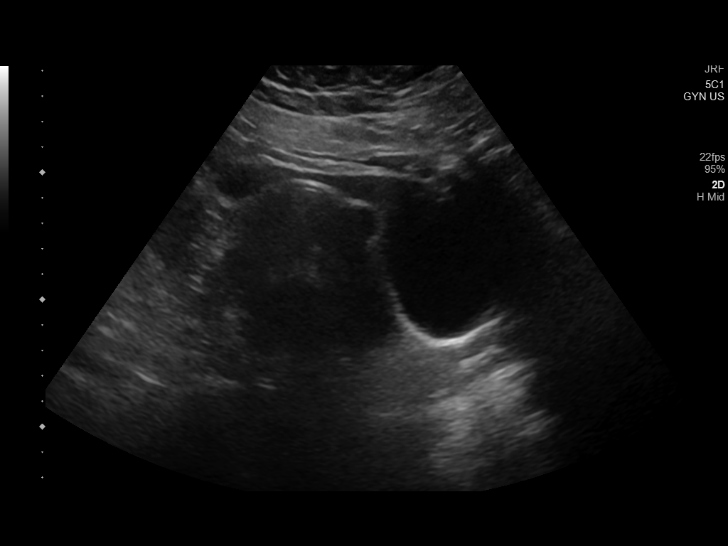
[im 8/89]
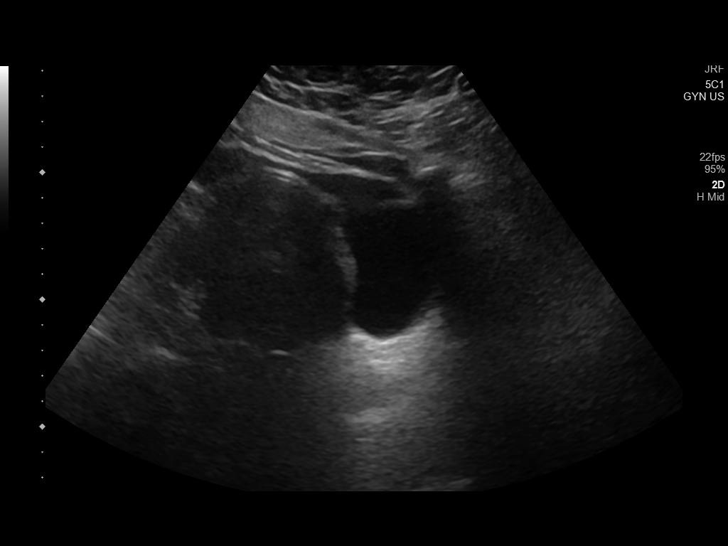
[im 15/89]
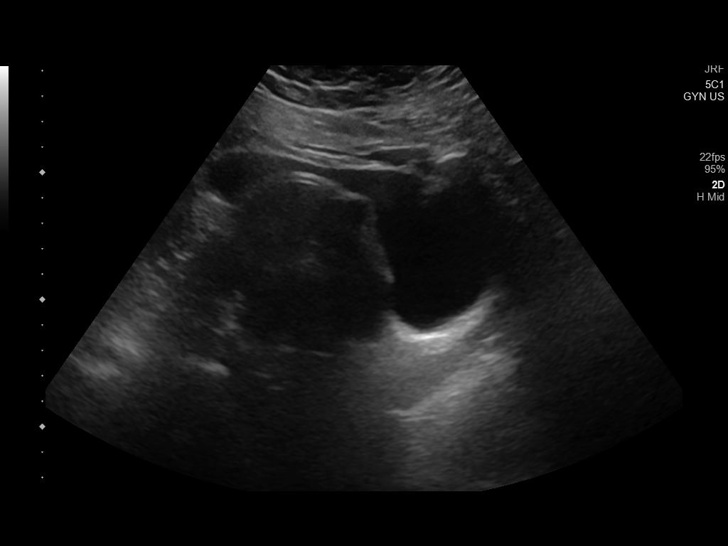
[im 23/89]
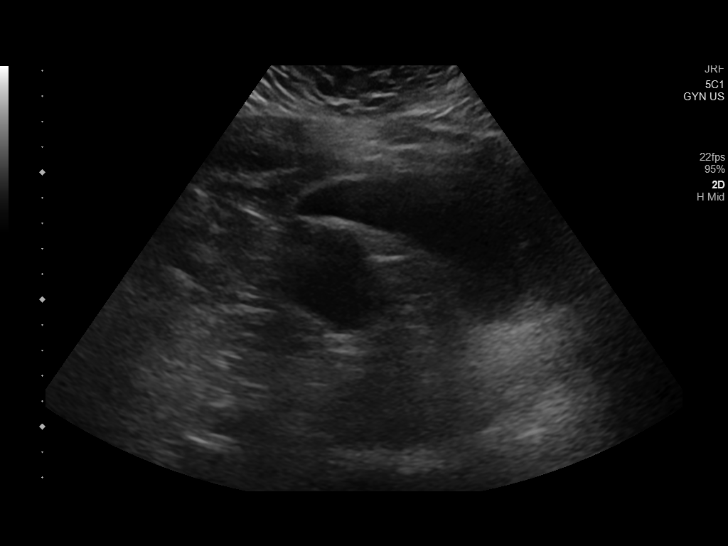
[im 30/89]
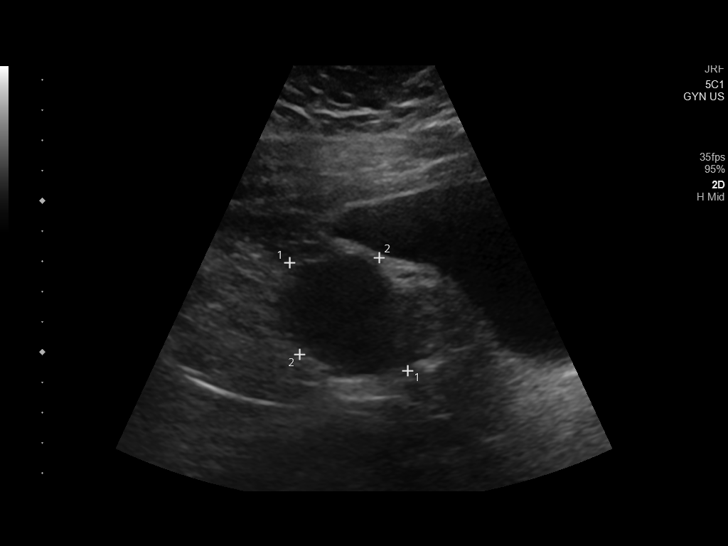
[im 37/89]
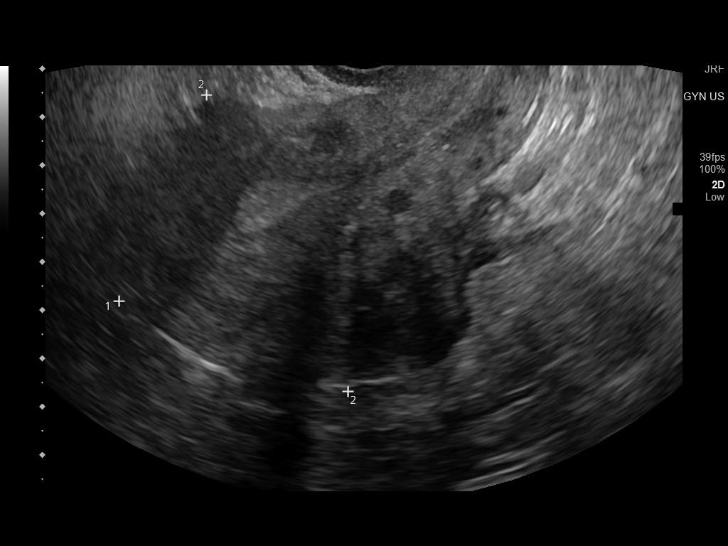
[im 45/89]
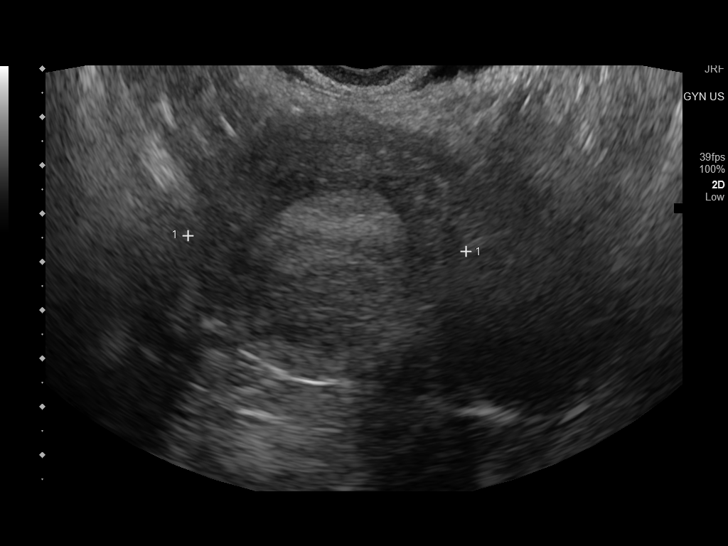
[im 52/89]
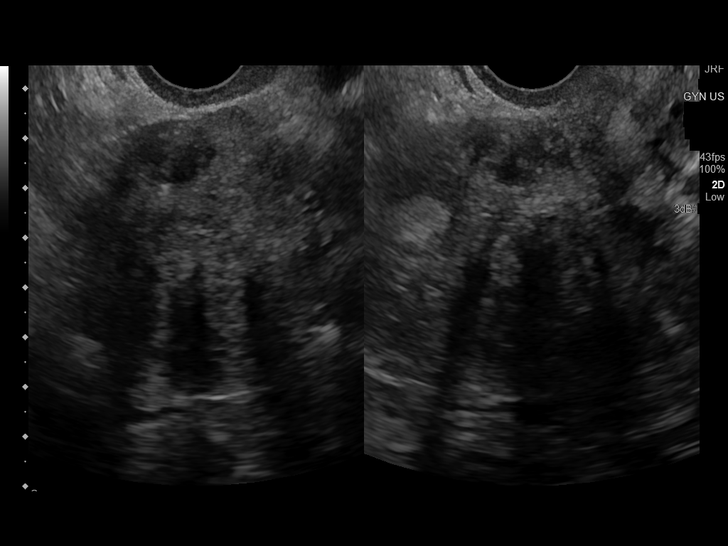
[im 59/89]
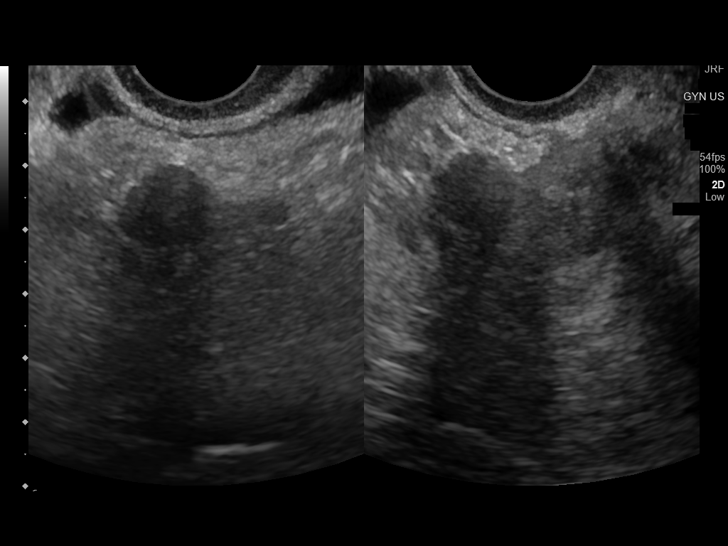
[im 67/89]
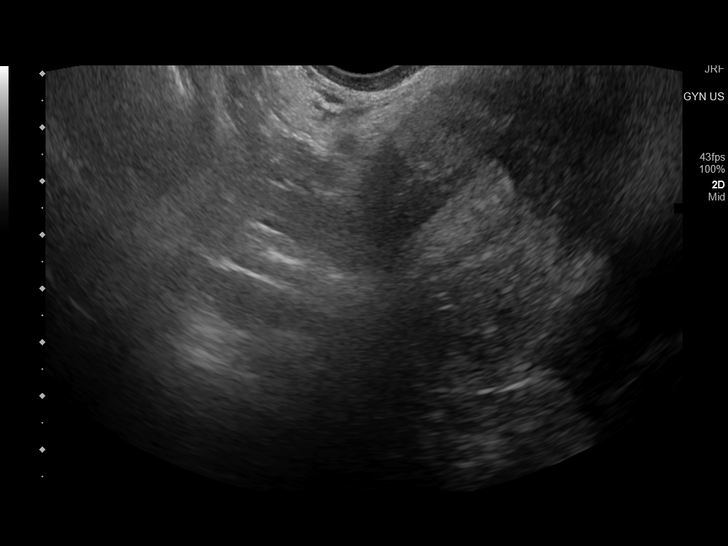
[im 74/89]
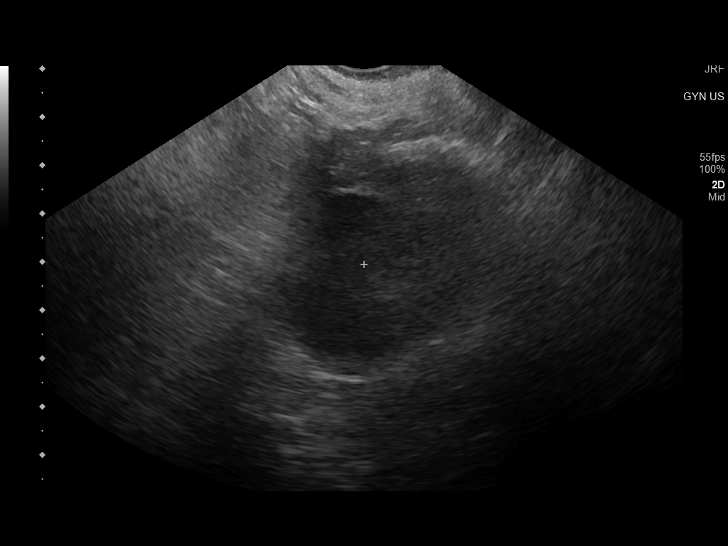
[im 81/89]
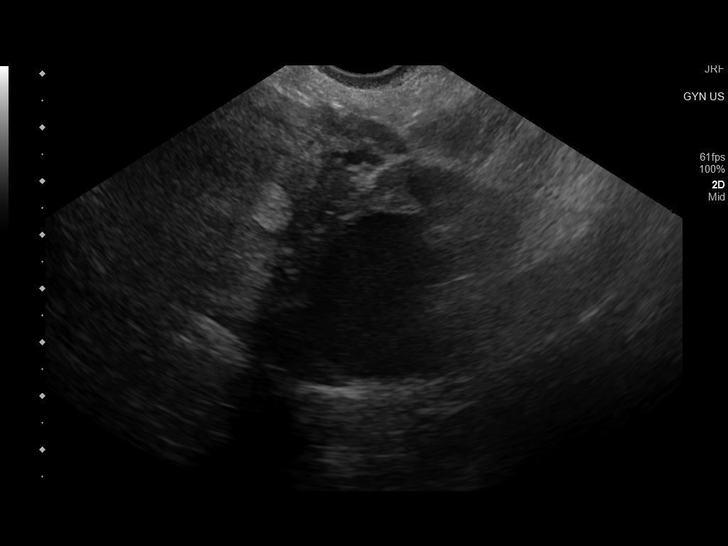
[im 89/89]
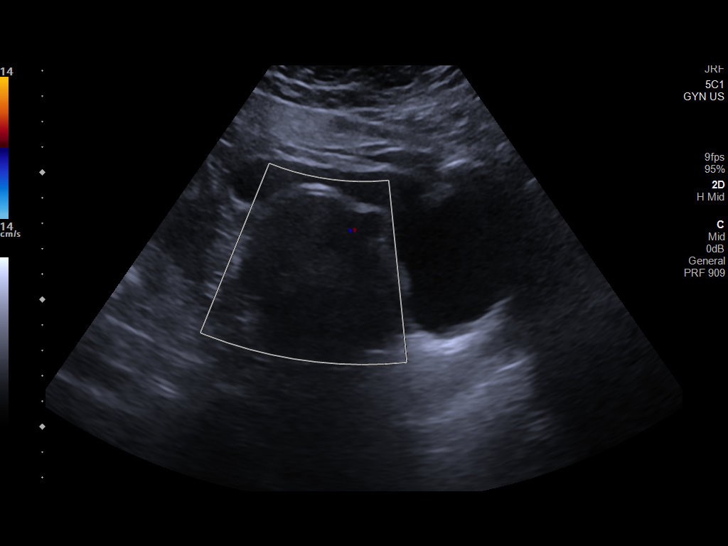

[13 of 25 positions shown; findings below may reference images not displayed]

FINDINGS: Uterus

Measurements: 10.3 x 6.8 x 5.8 cm = volume: 211 mL. Three fibroids
are seen. Largest lesion is in the mid uterus measuring 3.3 x 3.7 x
3.5 cm, mural or subserosal. There is a 1.7 x 1.5 x 1.8 cm
hypoechoic fibroid anteriorly that is subserosal. A 1.5 x 1.5 x
cm fibroid is partially exophytic.

Endometrium

Thickness: 11 mm, normal.  No focal abnormality visualized.

Right ovary

Not visualized by ultrasound.

Left ovary

Measurements: 5.2 x 4.4 x 3.7 cm = volume: 44 mL. There is a 2.9 x
3.2 x 3.1 cm minimally complex cyst with low-level internal echoes.
No nodular or soft tissue components. Blood flow seen to the
adjacent ovarian parenchyma.

Pulsed Doppler evaluation of the left ovary demonstrates normal
low-resistance arterial and venous waveforms.

Other findings

Free fluid on CT is not well seen by ultrasound.
IMPRESSION: 1. Mildly complex left ovarian cyst measuring 3.2 cm. This may
represent a hemorrhagic cyst. Normal blood flow to the adjacent
ovarian parenchyma, no torsion.
2. Adjacent free fluid on CT earlier today is not well-defined by
ultrasound. No significant free fluid is seen.
3. Uterine fibroids.
4. Right ovary not seen.

## 2022-10-30 IMAGING — US US ABDOMEN LIMITED
1 series · 15 of 25 positions shown · non-contrast
Comparison: None.

CLINICAL DATA: Abdominal pain.

EXAM:
ULTRASOUND ABDOMEN LIMITED RIGHT UPPER QUADRANT

[Series 1: us abdomen limited ruq · 15 of 57 slices shown]
[im 1/57]
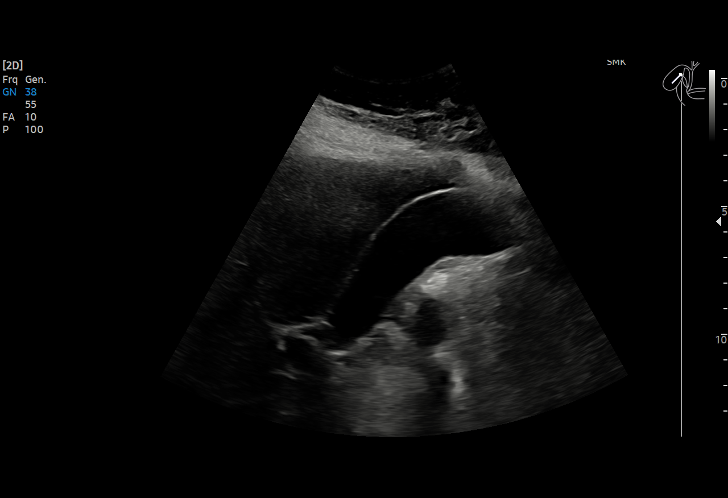
[im 5/57]
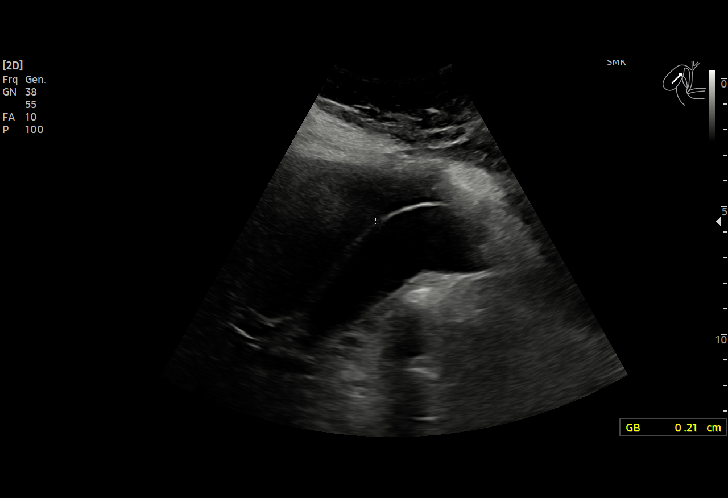
[im 10/57]
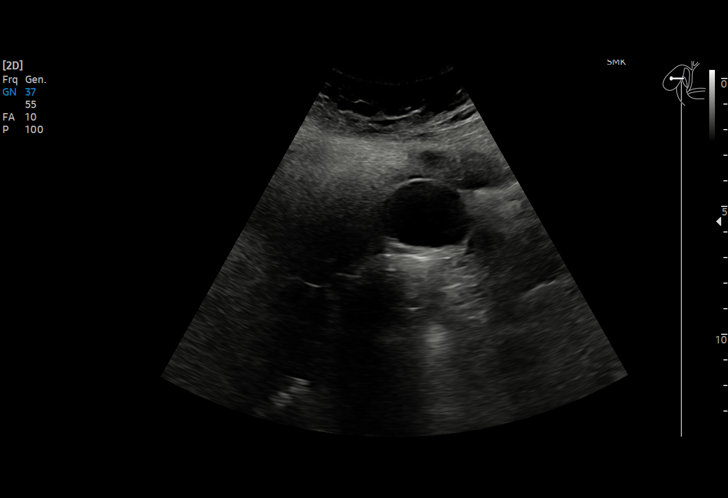
[im 12/57]
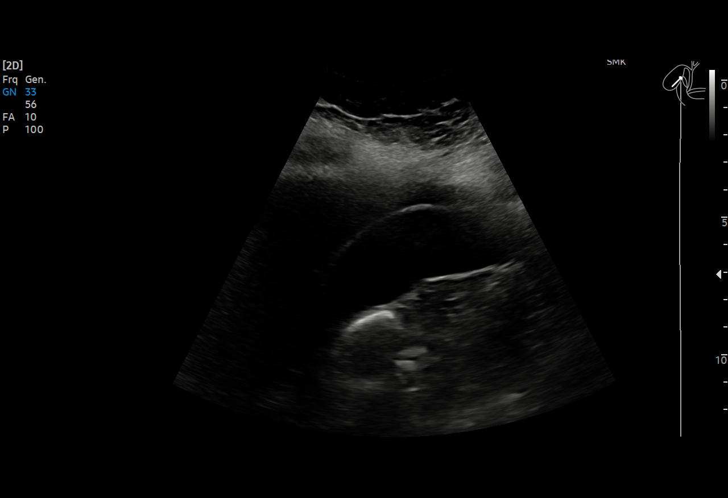
[im 17/57]
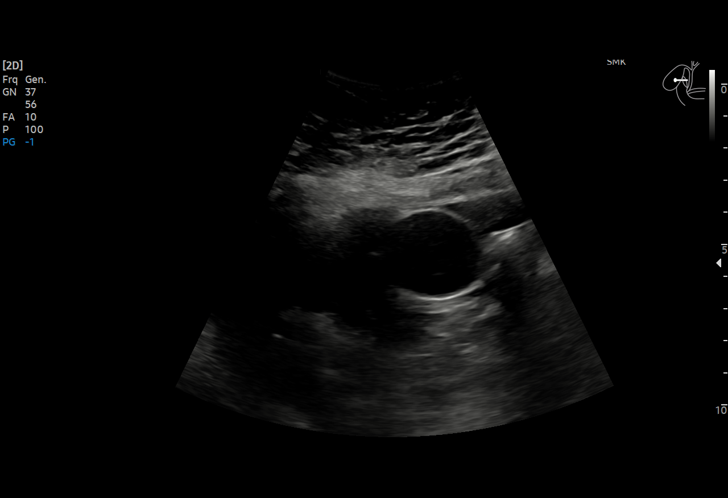
[im 22/57]
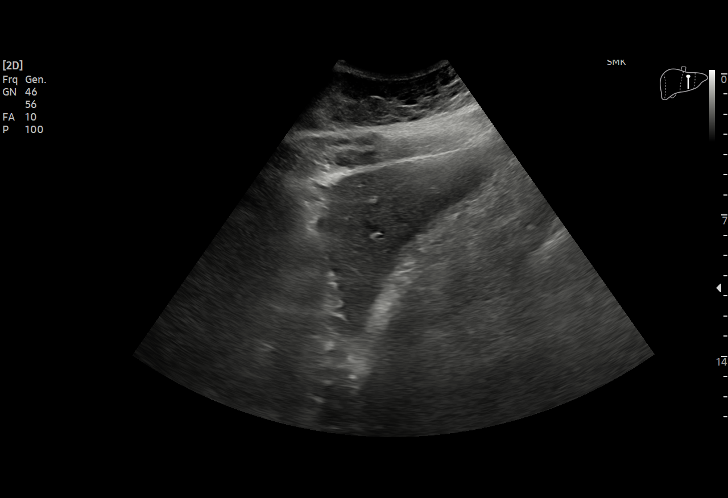
[im 24/57]
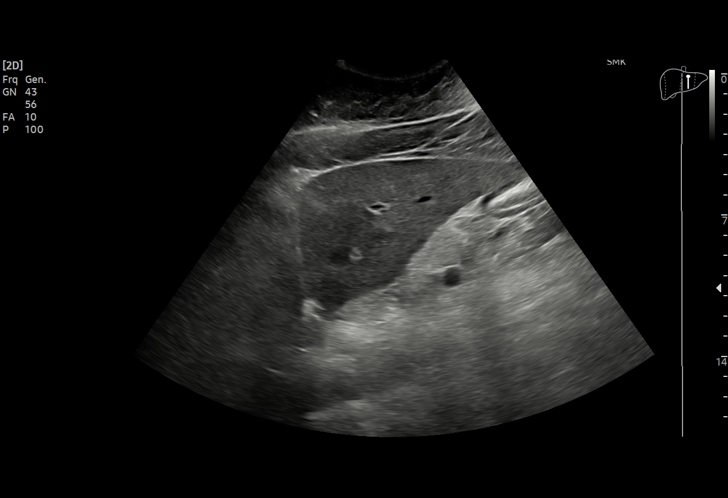
[im 29/57]
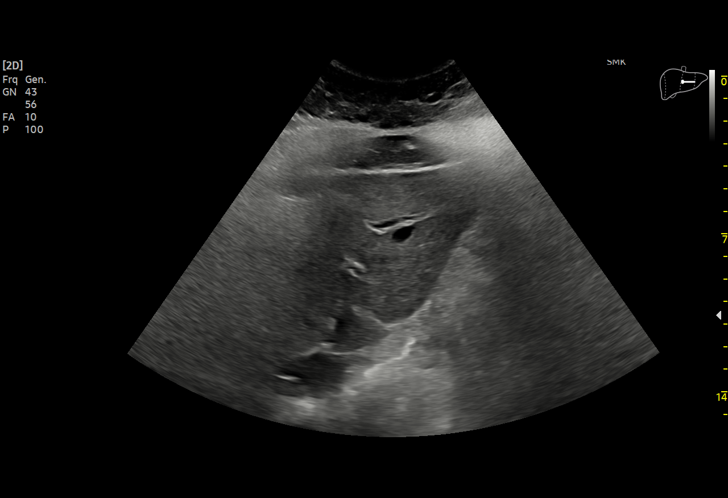
[im 33/57]
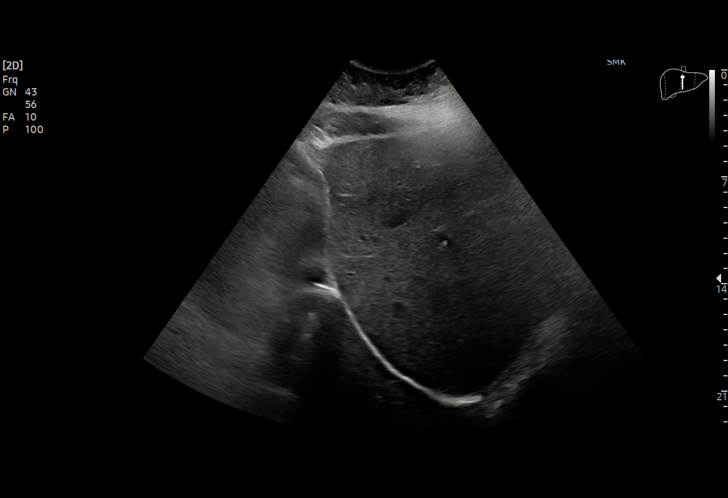
[im 36/57]
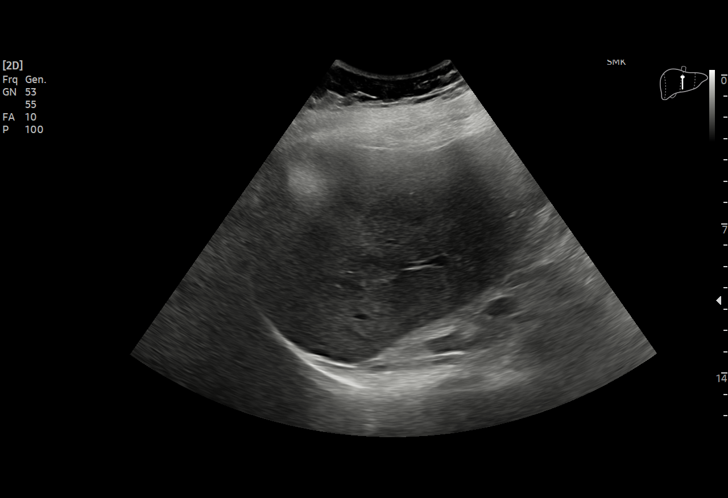
[im 40/57]
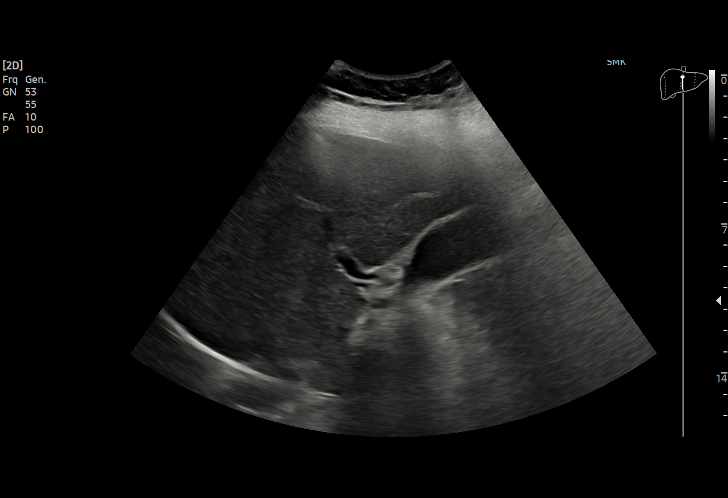
[im 45/57]
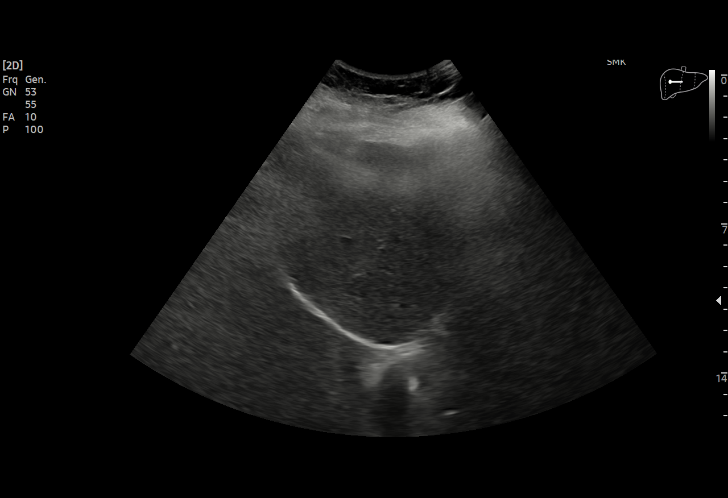
[im 47/57]
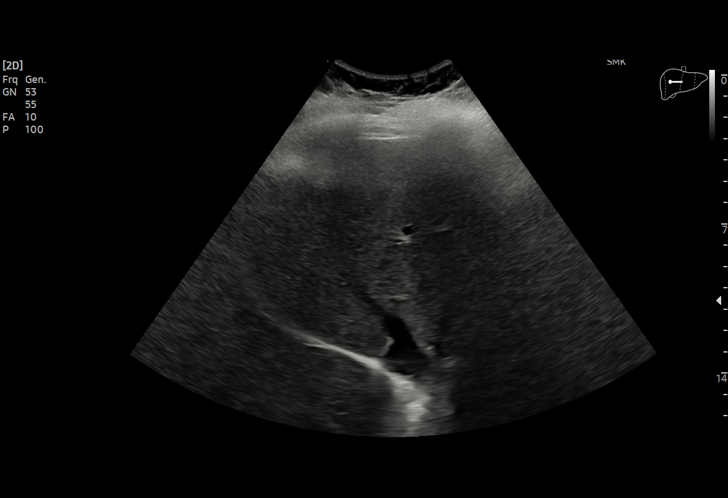
[im 52/57]
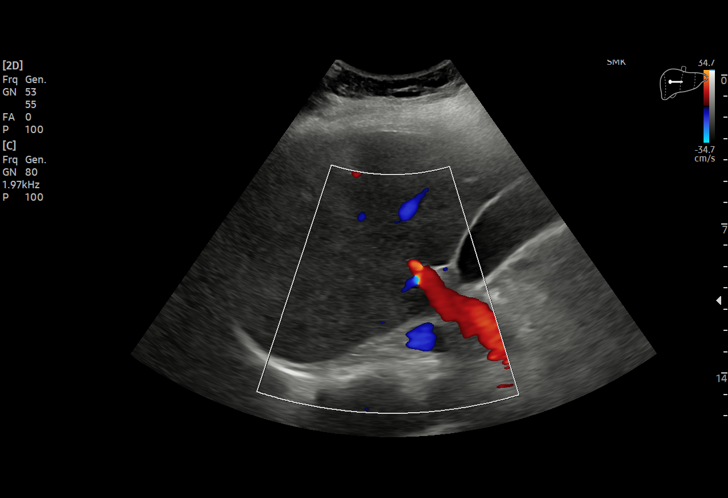
[im 57/57]
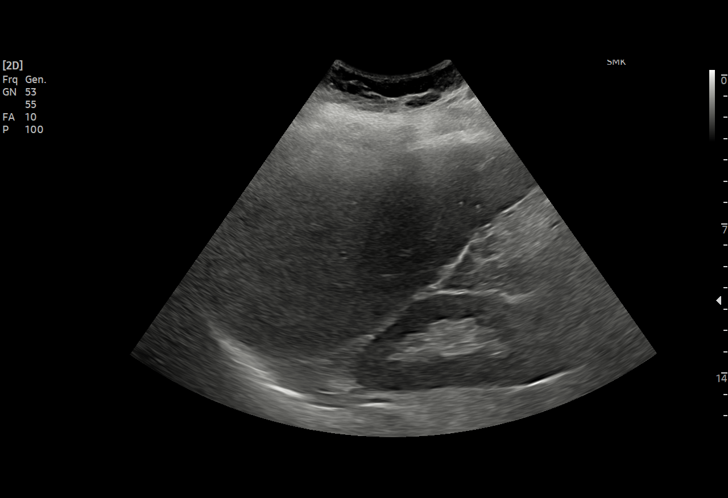

[15 of 25 positions shown; findings below may reference images not displayed]

FINDINGS: Gallbladder:

Physiologically distended. No gallstones or wall thickening
visualized. No sonographic Murphy sign noted by sonographer.

Common bile duct:

Diameter: 3 mm, normal.

Liver:

No focal lesion identified. Within normal limits in parenchymal
echogenicity. Portal vein is patent on color Doppler imaging with
normal direction of blood flow towards the liver.

Other: No right upper quadrant ascites.
IMPRESSION: Unremarkable right upper quadrant ultrasound.  No gallstones.

## 2022-11-01 DIAGNOSIS — R102 Pelvic and perineal pain: Secondary | ICD-10-CM | POA: Diagnosis not present

## 2022-11-01 DIAGNOSIS — R350 Frequency of micturition: Secondary | ICD-10-CM | POA: Diagnosis not present

## 2022-11-01 LAB — TSH: TSH: 0.09 — AB (ref 0.41–5.90)

## 2022-11-01 LAB — CBC AND DIFFERENTIAL
HCT: 45 (ref 36–46)
Hemoglobin: 15.1 (ref 12.0–16.0)
Platelets: 317 10*3/uL (ref 150–400)
WBC: 10.3

## 2022-11-01 LAB — CBC: RBC: 5.56 — AB (ref 3.87–5.11)

## 2022-11-02 ENCOUNTER — Ambulatory Visit: Payer: Self-pay | Admitting: Internal Medicine

## 2022-11-05 ENCOUNTER — Encounter: Payer: Self-pay | Admitting: Internal Medicine

## 2022-11-05 ENCOUNTER — Ambulatory Visit: Payer: Federal, State, Local not specified - PPO | Admitting: Internal Medicine

## 2022-11-05 VITALS — BP 110/80 | HR 86 | Ht 65.0 in | Wt 213.0 lb

## 2022-11-05 DIAGNOSIS — E890 Postprocedural hypothyroidism: Secondary | ICD-10-CM | POA: Diagnosis not present

## 2022-11-05 DIAGNOSIS — D259 Leiomyoma of uterus, unspecified: Secondary | ICD-10-CM

## 2022-11-05 NOTE — Progress Notes (Signed)
Date:  11/05/2022   Name:  Meghan Leach   DOB:  August 12, 1981   MRN:  [id number]   Chief Complaint: Establish Care  Thyroid Problem Presents for follow-up visit. Patient reports no anxiety or fatigue. (Managed by an alternative medicine practitioner in [place].) The symptoms have been stable.  Pelvic Pain The patient's primary symptoms include pelvic pain. This is a new problem. The current episode started in the past 7 days. The problem has been gradually improving. The pain is mild. Pertinent negatives include no chills, fever or headaches.    Lab Results  Component Value Date   NA 135 04/30/2021   K 4.2 04/30/2021   CO2 25 04/30/2021   GLUCOSE 91 04/30/2021   BUN 11 04/30/2021   CREATININE 0.68 04/30/2021   CALCIUM 8.8 (L) 04/30/2021   GFRNONAA >60 04/30/2021   No results found for: "CHOL", "HDL", "LDLCALC", "LDLDIRECT", "TRIG", "CHOLHDL" No results found for: "TSH" No results found for: "HGBA1C" Lab Results  Component Value Date   WBC 10.9 (H) 04/30/2021   HGB 15.2 (H) 04/30/2021   HCT 43.6 04/30/2021   MCV 77.7 (L) 04/30/2021   PLT 301 04/30/2021   Lab Results  Component Value Date   ALT 20 04/30/2021   AST 15 04/30/2021   ALKPHOS 67 04/30/2021   BILITOT 0.9 04/30/2021   No results found for: "25OHVITD2", "25OHVITD3", "VD25OH"   Review of Systems  Constitutional:  Negative for chills, fatigue and fever.  HENT:  Negative for trouble swallowing.   Respiratory:  Negative for cough, chest tightness and shortness of breath.   Cardiovascular:  Negative for chest pain and leg swelling.  Genitourinary:  Positive for pelvic pain.  Neurological:  Negative for dizziness, light-headedness and headaches.  Psychiatric/Behavioral:  Negative for dysphoric mood and sleep disturbance. The patient is not nervous/anxious.     Patient Active Problem List   Diagnosis Date Noted   Fibroid uterus 11/05/2022   Migraine 12/11/2019   Abnormal cervical Papanicolaou smear  12/11/2019   History of thyroidectomy 12/11/2019    Allergies  Allergen Reactions   Lactose Intolerance (Gi)    Metformin Other (See Comments)    bloated   Metformin And Related     sick    Past Surgical History:  Procedure Laterality Date   DILATION AND EVACUATION N/A 11/22/2019   Procedure: DILATATION AND EVACUATION;  Surgeon: Marylynn Pearson, MD;  Location: [facility];  Service: Gynecology;  Laterality: N/A;   OVARIAN CYST REMOVAL     THYROIDECTOMY  2006   TOTAL DONE FOR GRAVES DISEASE   TONSILLECTOMY  AS CHILD    Social History   Tobacco Use   Smoking status: Never   Smokeless tobacco: Never  Vaping Use   Vaping Use: Never used  Substance Use Topics   Alcohol use: No   Drug use: No     Medication list has been reviewed and updated.  Current Meds  Medication Sig   doxycycline (VIBRA-TABS) 100 MG tablet Take 100 mg by mouth 2 (two) times daily.   fluconazole (DIFLUCAN) 150 MG tablet Take 150 mg by mouth once.   HYDROcodone-acetaminophen (NORCO/VICODIN) 5-325 MG tablet Take 1 tablet by mouth every 6 (six) hours as needed.   ipratropium (ATROVENT) 0.06 % nasal spray Place 2 sprays into both nostrils 4 (four) times daily. (Patient taking differently: Place 2 sprays into both nostrils as needed.)   loteprednol (LOTEMAX) 0.5 % ophthalmic suspension Place 1 drop into the right eye 4 (  four) times daily.   Naltrexone HCl, Pain, (NALTREX) 4.5 MG CAPS Take by mouth at bedtime.   naproxen (NAPROSYN) 500 MG tablet Take 500 mg by mouth 2 (two) times daily.   NP THYROID 90 MG tablet Take 90 mg by mouth daily. morning   Prenatal Vit-Fe Fumarate-FA (MULTIVITAMIN-PRENATAL) 27-0.8 MG TABS tablet Take 1 tablet by mouth daily at 12 noon.   thyroid (ARMOUR) 60 MG tablet Take 60 mg by mouth daily. afternoon   [DISCONTINUED] Omega-3 Fatty Acids (FISH OIL) 1000 MG CAPS Take by mouth. 2 capsules daily       11/05/2022    3:36 PM  GAD 7 : Generalized Anxiety Score   Nervous, Anxious, on Edge 2  Control/stop worrying 2  Worry too much - different things 2  Trouble relaxing 2  Restless 0  Easily annoyed or irritable 0  Afraid - awful might happen 0  Total GAD 7 Score 8  Anxiety Difficulty Not difficult at all       11/05/2022    3:35 PM  Depression screen PHQ 2/9  Decreased Interest 1  Down, Depressed, Hopeless 1  PHQ - 2 Score 2  Altered sleeping 0  Tired, decreased energy 2  Change in appetite 0  Feeling bad or failure about yourself  0  Trouble concentrating 2  Moving slowly or fidgety/restless 0  Suicidal thoughts 0  PHQ-9 Score 6    BP Readings from Last 3 Encounters:  11/05/22 110/80  07/13/21 (!) 151/107  07/11/21 138/87    Physical Exam Vitals and nursing note reviewed.  Constitutional:      General: She is not in acute distress.    Appearance: Normal appearance. She is well-developed.  HENT:     Head: Normocephalic and atraumatic.  Neck:     Vascular: No carotid bruit.  Cardiovascular:     Rate and Rhythm: Normal rate and regular rhythm.  Pulmonary:     Effort: Pulmonary effort is normal. No respiratory distress.     Breath sounds: No wheezing or rhonchi.  Musculoskeletal:     Cervical back: Normal range of motion.     Right lower leg: No edema.     Left lower leg: No edema.  Lymphadenopathy:     Cervical: No cervical adenopathy.  Skin:    General: Skin is warm and dry.     Findings: No rash.  Neurological:     General: No focal deficit present.     Mental Status: She is alert and oriented to person, place, and time.  Psychiatric:        Mood and Affect: Mood normal.        Behavior: Behavior normal.     Wt Readings from Last 3 Encounters:  11/05/22 213 lb (96.6 kg)  07/13/21 225 lb 1.4 oz (102.1 kg)  07/11/21 225 lb (102.1 kg)    BP 110/80   Pulse 86   Ht 5\' 5"  (1.651 m)   Wt 213 lb (96.6 kg)   SpO2 98%   BMI 35.45 kg/m   Assessment and Plan:  Problem List Items Addressed This Visit        Genitourinary   Fibroid uterus    Seen by GYN earlier this week and started on antibiotics.  Pain is much improved. Repeat WBC was normal. Waiting for a specialist appointment for elevated Ca-125 of 374.         Other   History of thyroidectomy - Primary    She is stable  on Armour Thyroid I would be willing to assume prescribing if needed       No follow-ups on file.   Partially dictated using [place], any errors are not intentional.  Glean Hess, MD [place], [place]

## 2022-11-05 NOTE — Assessment & Plan Note (Addendum)
Seen by GYN earlier this week and started on antibiotics.  Pain is much improved. Repeat WBC was normal. Waiting for a specialist appointment for elevated Ca-125 of 374.

## 2022-11-05 NOTE — Assessment & Plan Note (Signed)
She is stable on Armour Thyroid I would be willing to assume prescribing if needed

## 2022-11-08 ENCOUNTER — Encounter: Payer: Self-pay | Admitting: Internal Medicine

## 2022-11-08 ENCOUNTER — Encounter: Payer: Self-pay | Admitting: Gynecologic Oncology

## 2022-11-08 ENCOUNTER — Telehealth: Payer: Self-pay

## 2022-11-08 NOTE — Telephone Encounter (Signed)
Spoke with Meghan Leach regarding her referral to GYN oncology. She has an appointment scheduled with Dr. Berline Lopes on 11/19/22 at [place]. Patient agrees to date and time. She has been provided with office address and location. She is also aware of our mask and visitor policy. Patient verbalized understanding and will call with any questions.

## 2022-11-10 ENCOUNTER — Other Ambulatory Visit: Payer: Self-pay

## 2022-11-10 ENCOUNTER — Inpatient Hospital Stay
Admission: RE | Admit: 2022-11-10 | Discharge: 2022-11-10 | Disposition: A | Discharge status: Home / Self Care | Payer: Self-pay | Source: Ambulatory Visit | Attending: Gynecologic Oncology | Admitting: Gynecologic Oncology

## 2022-11-10 DIAGNOSIS — R109 Unspecified abdominal pain: Secondary | ICD-10-CM

## 2022-11-12 ENCOUNTER — Ambulatory Visit: Payer: Federal, State, Local not specified - PPO | Admitting: Physician Assistant

## 2022-11-12 ENCOUNTER — Ambulatory Visit: Payer: Self-pay

## 2022-11-12 ENCOUNTER — Encounter: Payer: Self-pay | Admitting: Physician Assistant

## 2022-11-12 VITALS — BP 124/98 | HR 105 | Temp 98.0°F | Ht 65.0 in | Wt 213.0 lb

## 2022-11-12 DIAGNOSIS — R102 Pelvic and perineal pain: Secondary | ICD-10-CM | POA: Diagnosis not present

## 2022-11-12 DIAGNOSIS — D259 Leiomyoma of uterus, unspecified: Secondary | ICD-10-CM

## 2022-11-12 DIAGNOSIS — R399 Unspecified symptoms and signs involving the genitourinary system: Secondary | ICD-10-CM

## 2022-11-12 LAB — POCT URINALYSIS DIPSTICK
Bilirubin, UA: NEGATIVE
Blood, UA: NEGATIVE
Glucose, UA: NEGATIVE
Ketones, UA: POSITIVE — AB
Leukocytes, UA: NEGATIVE
Nitrite, UA: NEGATIVE
Protein, UA: NEGATIVE
Spec Grav, UA: >=1.03 — AB (ref 1.010–1.025)
Urobilinogen, UA: 0.2 E.U./dL
pH, UA: 5 (ref 5.0–8.0)

## 2022-11-12 MED ORDER — PHENAZOPYRIDINE HCL 200 MG PO TABS: 200.0000 mg | ORAL_TABLET | Freq: Three times a day (TID) | ORAL | 0 refills | Status: DC | PRN

## 2022-11-12 NOTE — Progress Notes (Signed)
Date:  11/12/2022   Name:  Meghan Leach   DOB:  07-Dec-1980   MRN:  [id number]   Chief Complaint: Urinary Tract Infection  Meghan Leach is a very pleasant 42 year old female new to me today to evaluate for suspected UTI with complaint of pelvic/bladder pain particularly worse within the last 24 hours.  She describes lower abdominal pain particularly in the mornings, temporary relief after urinary voiding, and gradually worsening as the bladder distends.  Pain is described as intermittent, sharp, and occasionally severe.  She denies dysuria, hematuria, fever, chills, flank pain, or bowel changes.   She adds however that she has had an eventful 2 weeks since presenting to the ED 10/30/2022 (note reviewed) with abdominal/pelvic pain and CT imaging revealing roughly 5 cm adnexal mass contiguous with uterine body and multiple exophytic fibroids, with recommendation for GYN follow-up and pelvic ultrasound.  She saw GYN 11/01/2022 who performed pelvic exam which was extremely painful, treated with doxycycline for presumptive PID and also tested Ca1 25 with results of 374 (roughly 10 times upper limit).  Finished doxycycline 5 days ago.  She has not yet received pelvic ultrasound; GYN advised her to follow-up with oncology for which she has appointment 11/19/2022.   Recent Labs     Component Value Date/Time   NA 135 04/30/2021 1301   K 4.2 04/30/2021 1301   CL 101 04/30/2021 1301   CO2 25 04/30/2021 1301   GLUCOSE 91 04/30/2021 1301   BUN 11 04/30/2021 1301   CREATININE 0.68 04/30/2021 1301   CALCIUM 8.8 (L) 04/30/2021 1301   PROT 7.6 04/30/2021 1301   ALBUMIN 4.4 04/30/2021 1301   AST 15 04/30/2021 1301   ALT 20 04/30/2021 1301   ALKPHOS 67 04/30/2021 1301   BILITOT 0.9 04/30/2021 1301   GFRNONAA >60 04/30/2021 1301   GFRAA >60 10/22/2017 1059    Lab Results  Component Value Date   WBC 10.3 11/01/2022   HGB 15.1 11/01/2022   HCT 45 11/01/2022   MCV 77.7 (L) 04/30/2021   PLT 317  11/01/2022   No results found for: "HGBA1C" No results found for: "CHOL", "HDL", "LDLCALC", "LDLDIRECT", "TRIG", "CHOLHDL" Lab Results  Component Value Date   TSH 0.09 (A) 11/01/2022    Review of Systems  Genitourinary:  Positive for frequency and pelvic pain. Negative for dysuria, flank pain, hematuria and vaginal bleeding.    Patient Active Problem List   Diagnosis Date Noted   Fibroid uterus 11/05/2022   Migraine 12/11/2019   Abnormal cervical Papanicolaou smear 12/11/2019   History of thyroidectomy 12/11/2019    Allergies  Allergen Reactions   Lactose Intolerance (Gi)    Metformin Other (See Comments)    bloated   Metformin And Related     sick    Past Surgical History:  Procedure Laterality Date   DILATION AND EVACUATION N/A 11/22/2019   Procedure: DILATATION AND EVACUATION;  Surgeon: Marylynn Pearson, MD;  Location: [facility];  Service: Gynecology;  Laterality: N/A;   OVARIAN CYST REMOVAL     THYROIDECTOMY  2006   TOTAL DONE FOR GRAVES DISEASE   TONSILLECTOMY  AS CHILD    Social History   Tobacco Use   Smoking status: Never   Smokeless tobacco: Never  Vaping Use   Vaping Use: Never used  Substance Use Topics   Alcohol use: No   Drug use: No     Medication list has been reviewed and updated.  Current Meds  Medication Sig  ipratropium (ATROVENT) 0.06 % nasal spray Place 2 sprays into both nostrils 4 (four) times daily. (Patient taking differently: Place 2 sprays into both nostrils as needed.)   loteprednol (LOTEMAX) 0.5 % ophthalmic suspension Place 1 drop into the right eye 4 (four) times daily.   Naltrexone HCl, Pain, (NALTREX) 4.5 MG CAPS Take by mouth at bedtime.   naproxen (NAPROSYN) 500 MG tablet Take 500 mg by mouth 2 (two) times daily.   NP THYROID 90 MG tablet Take 90 mg by mouth daily. morning   phenazopyridine (PHENAZO) 200 MG tablet Take 1 tablet (200 mg total) by mouth 3 (three) times daily as needed for pain.   thyroid  (ARMOUR) 60 MG tablet Take 60 mg by mouth daily. afternoon       11/12/2022    3:54 PM 11/05/2022    3:36 PM  GAD 7 : Generalized Anxiety Score  Nervous, Anxious, on Edge 2 2  Control/stop worrying 2 2  Worry too much - different things 2 2  Trouble relaxing 1 2  Restless 0 0  Easily annoyed or irritable 0 0  Afraid - awful might happen 1 0  Total GAD 7 Score 8 8  Anxiety Difficulty Not difficult at all Not difficult at all       11/12/2022    3:54 PM 11/08/2022   10:48 AM 11/05/2022    3:35 PM  Depression screen PHQ 2/9  Decreased Interest 1 0 1  Down, Depressed, Hopeless 0 0 1  PHQ - 2 Score 1 0 2  Altered sleeping 1  0  Tired, decreased energy 3  2  Change in appetite 0  0  Feeling bad or failure about yourself  0  0  Trouble concentrating 2  2  Moving slowly or fidgety/restless 0  0  Suicidal thoughts 0  0  PHQ-9 Score 7  6  Difficult doing work/chores Not difficult at all      BP Readings from Last 3 Encounters:  11/12/22 (!) 124/98  11/05/22 110/80  07/13/21 (!) 151/107    Physical Exam Vitals and nursing note reviewed.  Constitutional:      Appearance: Normal appearance.  Cardiovascular:     Rate and Rhythm: Normal rate and regular rhythm.     Heart sounds: Normal heart sounds.  Pulmonary:     Effort: Pulmonary effort is normal.     Breath sounds: Normal breath sounds.  Abdominal:     General: There is no distension.     Palpations: Abdomen is soft.     Tenderness: There is abdominal tenderness in the right lower quadrant, suprapubic area and left lower quadrant. There is no right CVA tenderness, left CVA tenderness, guarding or rebound.     Wt Readings from Last 3 Encounters:  11/12/22 213 lb (96.6 kg)  11/05/22 213 lb (96.6 kg)  07/13/21 225 lb 1.4 oz (102.1 kg)    BP (!) 124/98   Pulse (!) 105   Temp 98 F (36.7 C) (Oral)   Ht 5\' 5"  (1.651 m)   Wt 213 lb (96.6 kg)   LMP 10/24/2022 (Exact Date)   SpO2 98%   BMI 35.45 kg/m   Assessment  and Plan:  1. Pelvic pain Discussed with patient I believe the cause of her pain to be bladder distention pressing on uterine fibroids and adnexal structures.  Advised continue naproxen 500 mg twice daily with food and may add Tylenol 1000 mg 3 times daily as needed for additional pain control.  I am also prescribing phenazopyridine which I discussed with patient may or may not help alleviate some of the bladder discomfort.  Reviewed this medication is not meant to treat UTIs, but simply for symptomatic relief.   Also recommended routine voiding of bladder, especially before bedtime, and limiting fluid intake at night.  Complete workup with GYN/oncology  2. UTI symptoms Urine dipstick negative today for UTI. - POCT urinalysis dipstick - phenazopyridine (PHENAZO) 200 MG tablet; Take 1 tablet (200 mg total) by mouth 3 (three) times daily as needed for pain.  Dispense: 10 tablet; Refill: 0  3. Uterine leiomyoma, unspecified location Discussed results of recent CT findings, demonstrated to patient the size of her adnexal mass.  Advised continue care with specialists.  Will try to obtain GYN notes   Return if symptoms worsen or fail to improve.   Partially dictated using Editor, commissioning. Any errors are unintentional.  Lupita Leash, PA-C, [facility]

## 2022-11-12 NOTE — Telephone Encounter (Signed)
  Chief Complaint: Pain with urination Symptoms: above -  Frequency: 2 days Pertinent Negatives: Patient denies fever Disposition: [] ED /[] Urgent Care (no appt availability in office) / [x] Appointment(In office/virtual)/ []  [facility]/ [] Home Care/ [] Refused Recommended Disposition /[] [facility]/ []  Follow-up with PCP Additional Notes: PT is unsure if this is a UTI or not. PT has pain with urination and lower pelvic pain.  PT states she has ongoing gynecological issues.    Summary: urinary discomfort   The patient would like to speak with a member of clinical staff about their urinary discomfort that has occurred for roughly 2 days  The patient shares that they have ongoing fibroid issues as well and are uncertain if they're related  Please contact the patient further when possible     Reason for Disposition  All other patients with painful urination  (Exception: [1] EITHER frequency or urgency AND [2] has on-call doctor.)  Answer Assessment - Initial Assessment Questions 1. SEVERITY: "How bad is the pain?"  (e.g., Scale 1-10; mild, moderate, or severe)   - MILD (1-3): complains slightly about urination hurting   - MODERATE (4-7): interferes with normal activities     - SEVERE (8-10): excruciating, unwilling or unable to urinate because of the pain      Mild moderate 2. FREQUENCY: "How many times have you had painful urination today?"      2 days 3. PATTERN: "Is pain present every time you urinate or just sometimes?"       4. ONSET: "When did the painful urination start?"      2 days 5. FEVER: "Do you have a fever?" If Yes, ask: "What is your temperature, how was it measured, and when did it start?"      6. PAST UTI: "Have you had a urine infection before?" If Yes, ask: "When was the last time?" and "What happened that time?"       7. CAUSE: "What do you think is causing the painful urination?"  (e.g., UTI, scratch, Herpes sore)     Unsure 8. OTHER  SYMPTOMS: "Do you have any other symptoms?" (e.g., blood in urine, flank pain, genital sores, urgency, vaginal discharge)      9. PREGNANCY: "Is there any chance you are pregnant?" "When was your last menstrual period?"  Protocols used: Urination Pain - Female-A-AH

## 2022-11-12 NOTE — Patient Instructions (Signed)
-  continue naproxen 500 mg 2x/day with food as needed  -you may add Tylenol 500mg  as two tablets up to 3x/day  -I am also prescribing Azo (phenazopyridine) for bladder discomfort. This does not treat infection, but is strictly for symptom relief. If this helps, let us know.

## 2022-11-17 NOTE — Progress Notes (Signed)
GYNECOLOGIC ONCOLOGY NEW PATIENT CONSULTATION   Patient Name: Meghan Leach  Patient Age: 42 y.o. Date of Service: 11/19/22 Referring Provider: Marylynn Pearson, MD  Primary Care Provider: Glean Hess, MD Consulting Provider: Jeral Pinch, MD   Assessment/Plan:  Premenopausal patient with enlarged uterine fibroid, left adnexal mass.  I discussed with the patient and her husband recent imaging findings.  Unfortunately, her most recent CT scan was done outside of our system and I do not have the images available yet for review.  We looked through the report together.  We discussed that on prior CT imaging and ultrasound imaging, there has been a cystic lesion in the left ovary measuring up to 5-6 cm.  Given the recent episode of pain, its characterization as being acute onset, severe, with associated symptoms, I suspect that the patient may have had an episode of torsion.  It is also possible that she ruptured a cyst although given persistent cyst seen on imaging when she presented after acute pain had improved, I think this is less likely.  Given her continued intermittent symptoms, I recommend surgery for both diagnostic and hopefully therapeutic purposes.  If at the time of surgery, there is a mass involving the left ovary, I would recommend that we proceed with cystectomy versus unilateral oophorectomy versus unilateral salpingo-oophorectomy.  The mass would be sent for frozen section.  If benign, no additional procedures would necessarily be indicated.  If borderline or malignant, we discussed additional procedures that may be indicated including contralateral salpingo-oophorectomy, total hysterectomy, and staging procedures including peritoneal biopsies, omentectomy, and lymphadenectomy.  If there is no adnexal mass and mass appears to be a fibroid, we discussed attempt at myomectomy versus proceeding with total hysterectomy.  At this time, the patient voices that she and her  husband are having intercourse and would welcome a pregnancy.  They have not completely decided that they are ready to complete childbearing efforts.  When she met previously with an REI specialist, she was told that she would likely require a donor to achieve pregnancy.  Therefore, I assume that her ovarian reserve is low.  I have suggested that the patient and her husband take some time between now and surgery to discuss fertility desires.  If benign, the uterus, cervix, and unaffected fallopian tube and ovary could be left in situ.  If borderline tumor found of the ovary, the contralateral adnexa as well as the uterus and cervix could also be left in situ.  In the setting of malignancy, I would likely recommend removal of pelvic organs.    We discussed her Ca1 40 which was elevated.  I reviewed that this is not a diagnostic tumor marker and that it can be normal in cases of early stage ovarian cancer and abnormal and other disease processes such as endometriosis or fibroids.  I think very likely that this mass represents a benign adnexal cyst.  We reviewed the plan for a robotic assisted cystectomy versus UO vs USO, possible staging including contralateral salpingo-oophorectomy, total hysterectomy, possible staging (including omentectomy, lymph node dissection), and possible laparotomy. The risks of surgery were discussed in detail and she understands these to include infection; wound separation; hernia; injury to adjacent organs such as bowel, bladder, blood vessels, ureters and nerves; bleeding which may require blood transfusion; anesthesia risk; thromboembolic events; possible death; unforeseen complications; possible need for re-exploration; medical complications such as heart attack, stroke, pleural effusion and pneumonia; and, if full lymphadenectomy is performed the risk of lymphedema and  lymphocyst. The patient will receive DVT and antibiotic prophylaxis as indicated. She voiced a clear  understanding. She had the opportunity to ask questions. Perioperative instructions were reviewed with her. Prescriptions for post-op medications were sent to her pharmacy of choice.  Given her MTHFR mutation, we will reach out to one of our heme/onc physicians to inquire about need for short course of prophylactic anticoagulation after surgery.  A copy of this note was sent to the patient's referring provider.   75 minutes of total time was spent for this patient encounter, including preparation, face-to-face counseling with the patient and coordination of care, and documentation of the encounter.  Jeral Pinch, MD  Division of Gynecologic Oncology  Department of Obstetrics and Gynecology  [facility]  ___________________________________________  Chief Complaint: No chief complaint on file.   History of Present Illness:  Meghan Leach is a 42 y.o. y.o. female who is seen in consultation at the request of Dr. Julien Girt for an evaluation of a pelvic mass.  The patient was recently seen in the emergency department in early March with acute onset lower abdominal pain that started the night before accompanied by nausea and diaphoresis.  She reports developing sudden onset of pain that lasted for about an hour that caused her to be in fetal position.  She was able to make it to urgent care after the pain improved.  Was noted to be tender to palpation on exam in the lower abdomen.  She has had similar type episodes previously, where she has developed sudden onset of pain.  In 2022, was told that this was likely related to a cyst rupturing.  Work-up thus far: 10/2017: Pelvic ultrasound shows left ovary measures up to 5.3 cm with a dominant cyst measuring up to 3.5 cm with some internal debris.  Smaller adjacent hemorrhagic cyst with internal septations measures almost 2 cm. 10/2017: CT of the abdomen and pelvis shows mild left pelvic mesenteric stranding, possibly  involving the left ovary/adnexa.  Bilateral ovarian cysts, measuring up to 3.5 cm on the left, likely physiologic.  Uterine fibroids. 09/17/2020: [facility] a normal-sized uterus.  Right ovary measures up to 3.8 cm and left up to 4.9 centimeters.  Multiple intramural fibroids noted, largest measuring up to 3.5 cm.  Right ovary with a 2.2 cm simple appearing cyst.  Left ovary with a 5 cm simple appearing cyst without blood flow. 04/30/2021: Pelvic ultrasound shows Uterus measures 10.2 x 6.8 x 5.8 cm.  There multiple fibroids noted, the largest measuring up to 3.7 cm.  Left ovary measures 5.2 x 4.4 x 3.7 cm with a 3.2 cm minimally complex cyst with low-level internal echoes.  Right ovary not visualized. 04/30/2021: CT of the abdomen and pelvis reveals a complex appearing 5.6 cm left ovarian cyst with some pelvic free fluid, possibly representing a ruptured ovarian cyst.  Myomatous uterus. 10/30/2022: CT abdomen and pelvis for acute lower abdominal pain shows an anteverted fibroid uterus.  There is a 5.4 x 5.1 x 4.5 cm intermediate attenuation lesion in the left adnexal region, difficult to discern whether this is arising from the left adnexa or uterus.  There are areas of enhancing mural nodularity.  The left gonadal vessels are well-visualized without twisting.  Differential includes primary adnexal cystic structure, degenerated exophytic fibroid. [id number]  The patient presents with her husband today.  She notes that since last Friday, she has had some suprapubic pain when she wakes up as  well as when her bladder is full.  She was seen by her primary care provider and told that she does not have a urinary tract infection.  She endorses normal bowel movements.  Drinks a lot of water but reports increased urinary frequency.  She endorses a good appetite without nausea or emesis.  She denies any recent weight changes.  Patient endorses regular menses although heavy and  painful, with passage of clots.  She denies any intermenstrual bleeding.  She has some pain with ovulation.  She reports a diagnosis of endometriosis at the time that she underwent laparoscopic removal of a cyst from one of her ovaries about 10 years ago at a practice in [place].  More recently, she has seen an REI about infertility.  At the time of that visit, the couple was told that they would likely need to use an egg donor to achieve pregnancy.  PAST MEDICAL HISTORY:  Past Medical History:  Diagnosis Date   Anemia    Endometriosis    cyst removed from ovary   Fibroid, uterine    Headache    History of Graves' disease    Hypothyroidism    Ovarian cyst    PCOS (polycystic ovarian syndrome)    PONV (postoperative nausea and vomiting)    NAUSEA   Scleratitis    Thyroid disease      PAST SURGICAL HISTORY:  Past Surgical History:  Procedure Laterality Date   BREAST BIOPSY Right    biopsy benign   DILATION AND EVACUATION N/A 11/22/2019   Procedure: DILATATION AND EVACUATION;  Surgeon: Marylynn Pearson, MD;  Location: [facility];  Service: Gynecology;  Laterality: N/A;   OVARIAN CYST REMOVAL     in [place], 2013-14, [facility]   THYROIDECTOMY  2006   TOTAL DONE FOR GRAVES DISEASE   TONSILLECTOMY  AS CHILD    OB/GYN HISTORY:  OB History  Gravida Para Term Preterm AB Living  2 0          SAB IAB Ectopic Multiple Live Births               # Outcome Date GA Lbr Len/2nd Weight Sex Delivery Anes PTL Lv  2 Gravida           1 Gravida             Patient's last menstrual period was 10/24/2022 (exact date).  Age at menarche: 46 Age at menopause: Not applicable Hx of HRT: Not applicable Hx of STDs: Denies Last pap: 01/2020- NIML, HR HPV negative History of abnormal pap smears: yes, had LEEP 2005-6; reports that Pap smears since have been normal.  SCREENING STUDIES:  Last mammogram: 2021  Last colonoscopy: 2010  MEDICATIONS: Outpatient Encounter Medications as  of 11/19/2022  Medication Sig   ibuprofen (ADVIL) 800 MG tablet Take 1 tablet (800 mg total) by mouth every 8 (eight) hours as needed for moderate pain. For AFTER surgery only   ipratropium (ATROVENT) 0.06 % nasal spray Place 2 sprays into both nostrils 4 (four) times daily. (Patient taking differently: Place 2 sprays into both nostrils as needed.)   Naltrexone HCl, Pain, (NALTREX) 4.5 MG CAPS Take by mouth at bedtime.   naproxen (NAPROSYN) 500 MG tablet Take 500 mg by mouth 2 (two) times daily.   NP THYROID 90 MG tablet Take 90 mg by mouth daily. morning   oxyCODONE (OXY IR/ROXICODONE) 5 MG immediate release tablet Take 1 tablet (5 mg total) by mouth every 4 (four) hours  as needed for severe pain. For AFTER surgery only, do not take and drive   senna-docusate (SENOKOT-S) 8.6-50 MG tablet Take 2 tablets by mouth at bedtime. For AFTER surgery, do not take if having diarrhea   thyroid (ARMOUR) 60 MG tablet Take 60 mg by mouth daily. afternoon   [DISCONTINUED] loteprednol (LOTEMAX) 0.5 % ophthalmic suspension Place 1 drop into the right eye 4 (four) times daily.   [DISCONTINUED] doxycycline (VIBRA-TABS) 100 MG tablet Take 100 mg by mouth 2 (two) times daily.   [DISCONTINUED] fluconazole (DIFLUCAN) 150 MG tablet Take 150 mg by mouth once. (Patient not taking: Reported on 11/08/2022)   [DISCONTINUED] HYDROcodone-acetaminophen (NORCO/VICODIN) 5-325 MG tablet Take 1 tablet by mouth every 6 (six) hours as needed.   [DISCONTINUED] Prenatal Vit-Fe Fumarate-FA (MULTIVITAMIN-PRENATAL) 27-0.8 MG TABS tablet Take 1 tablet by mouth daily at 12 noon.   No facility-administered encounter medications on file as of 11/19/2022.    ALLERGIES:  Allergies  Allergen Reactions   Lactose Intolerance (Gi)    Metformin Other (See Comments)    bloated   Metformin And Related     sick     FAMILY HISTORY:  Family History  Problem Relation Age of Onset   Hypertension Father    Prostate cancer Father    Breast cancer  Maternal Aunt    Stroke Maternal Grandfather    Diabetes Maternal Grandfather    Cancer Maternal Grandfather    Diabetes Paternal Grandfather    Colon cancer Neg Hx    Ovarian cancer Neg Hx    Endometrial cancer Neg Hx    Pancreatic cancer Neg Hx      SOCIAL HISTORY:  Social Connections: Not on file    REVIEW OF SYSTEMS:  + Fatigue, abdominal pain, dyspareunia, painful urination, frequency, menstrual problems, pelvic pain, vaginal spotting, back pain. Denies appetite changes, fevers, chills, unexplained weight changes. Denies hearing loss, neck lumps or masses, mouth sores, ringing in ears or voice changes. Denies cough or wheezing.  Denies shortness of breath. Denies chest pain or palpitations. Denies leg swelling. Denies abdominal distention, pain, blood in stools, constipation, diarrhea, nausea, vomiting, or early satiety. Denies hematuria or incontinence. Denies hot flashes.   Denies joint pain or muscle pain/cramps. Denies itching, rash, or wounds. Denies dizziness, headaches, numbness or seizures. Denies swollen lymph nodes or glands, denies easy bruising or bleeding. Denies anxiety, depression, confusion, or decreased concentration.  Physical Exam:  Vital Signs for this encounter:  Blood pressure (!) 126/91, pulse 81, temperature 98.9 F (37.2 C), temperature source Oral, resp. rate 16, height 5\' 5"  (1.651 m), weight 210 lb (95.3 kg), last menstrual period 10/24/2022, SpO2 100 %, unknown if currently breastfeeding. Body mass index is 34.95 kg/m. General: Alert, oriented, no acute distress.  HEENT: Normocephalic, atraumatic. Sclera anicteric.  Chest: Clear to auscultation bilaterally. No wheezes, rhonchi, or rales. Cardiovascular: Regular rate and rhythm, no murmurs, rubs, or gallops.  Abdomen: Obese. Normoactive bowel sounds. Soft, nondistended, nontender to palpation. No masses or hepatosplenomegaly appreciated. No palpable fluid wave.  Extremities: Grossly normal  range of motion. Warm, well perfused. No edema bilaterally.  Skin: No rashes or lesions.  Lymphatics: No cervical, supraclavicular, or inguinal adenopathy.  GU:  Normal external female genitalia. No lesions. No discharge or bleeding.             Bladder/urethra:  No lesions or masses, well supported bladder             Vagina: Well-rugated, minimal older appearing blood in  the vaginal vault.             Cervix: Normal appearing, no lesions.             Uterus: Somewhat enlarged, 10-12 cm, mobile, no parametrial involvement or nodularity.             Adnexa: There is fullness along the left aspect of the uterus, although I am unable to appreciate this as separate from the uterus itself.  No nodularity.  Rectal: Deferred.  LABORATORY AND RADIOLOGIC DATA:  Outside medical records were reviewed to synthesize the above history, along with the history and physical obtained during the visit.   Lab Results  Component Value Date   WBC 10.3 11/01/2022   HGB 15.1 11/01/2022   HCT 45 11/01/2022   PLT 317 11/01/2022   GLUCOSE 91 04/30/2021   ALT 20 04/30/2021   AST 15 04/30/2021   NA 135 04/30/2021   K 4.2 04/30/2021   CL 101 04/30/2021   CREATININE 0.68 04/30/2021   BUN 11 04/30/2021   CO2 25 04/30/2021   TSH 0.09 (A) 11/01/2022

## 2022-11-18 ENCOUNTER — Encounter: Payer: Self-pay | Admitting: Internal Medicine

## 2022-11-18 DIAGNOSIS — N809 Endometriosis, unspecified: Secondary | ICD-10-CM | POA: Insufficient documentation

## 2022-11-18 DIAGNOSIS — N83209 Unspecified ovarian cyst, unspecified side: Secondary | ICD-10-CM | POA: Insufficient documentation

## 2022-11-19 ENCOUNTER — Inpatient Hospital Stay (HOSPITAL_BASED_OUTPATIENT_CLINIC_OR_DEPARTMENT_OTHER): Payer: Federal, State, Local not specified - PPO | Admitting: Gynecologic Oncology

## 2022-11-19 ENCOUNTER — Inpatient Hospital Stay: Payer: Federal, State, Local not specified - PPO | Attending: Gynecologic Oncology | Admitting: Gynecologic Oncology

## 2022-11-19 ENCOUNTER — Encounter: Payer: Self-pay | Admitting: Gynecologic Oncology

## 2022-11-19 VITALS — BP 126/91 | HR 81 | Temp 98.9°F | Resp 16 | Ht 65.0 in | Wt 210.0 lb

## 2022-11-19 DIAGNOSIS — Z148 Genetic carrier of other disease: Secondary | ICD-10-CM | POA: Insufficient documentation

## 2022-11-19 DIAGNOSIS — R109 Unspecified abdominal pain: Secondary | ICD-10-CM | POA: Diagnosis not present

## 2022-11-19 DIAGNOSIS — Z1589 Genetic susceptibility to other disease: Secondary | ICD-10-CM

## 2022-11-19 DIAGNOSIS — R971 Elevated cancer antigen 125 [CA 125]: Secondary | ICD-10-CM | POA: Insufficient documentation

## 2022-11-19 DIAGNOSIS — E039 Hypothyroidism, unspecified: Secondary | ICD-10-CM | POA: Diagnosis not present

## 2022-11-19 DIAGNOSIS — Z791 Long term (current) use of non-steroidal anti-inflammatories (NSAID): Secondary | ICD-10-CM | POA: Diagnosis not present

## 2022-11-19 DIAGNOSIS — N809 Endometriosis, unspecified: Secondary | ICD-10-CM | POA: Insufficient documentation

## 2022-11-19 DIAGNOSIS — D3912 Neoplasm of uncertain behavior of left ovary: Secondary | ICD-10-CM | POA: Diagnosis not present

## 2022-11-19 DIAGNOSIS — R309 Painful micturition, unspecified: Secondary | ICD-10-CM | POA: Diagnosis not present

## 2022-11-19 DIAGNOSIS — Z8042 Family history of malignant neoplasm of prostate: Secondary | ICD-10-CM | POA: Diagnosis not present

## 2022-11-19 DIAGNOSIS — Z79899 Other long term (current) drug therapy: Secondary | ICD-10-CM | POA: Insufficient documentation

## 2022-11-19 DIAGNOSIS — N9489 Other specified conditions associated with female genital organs and menstrual cycle: Secondary | ICD-10-CM

## 2022-11-19 DIAGNOSIS — E66811 Obesity, class 1: Secondary | ICD-10-CM

## 2022-11-19 DIAGNOSIS — Z79890 Hormone replacement therapy (postmenopausal): Secondary | ICD-10-CM | POA: Insufficient documentation

## 2022-11-19 DIAGNOSIS — E282 Polycystic ovarian syndrome: Secondary | ICD-10-CM | POA: Diagnosis not present

## 2022-11-19 DIAGNOSIS — E0500 Thyrotoxicosis with diffuse goiter without thyrotoxic crisis or storm: Secondary | ICD-10-CM | POA: Insufficient documentation

## 2022-11-19 DIAGNOSIS — Z803 Family history of malignant neoplasm of breast: Secondary | ICD-10-CM | POA: Insufficient documentation

## 2022-11-19 DIAGNOSIS — R978 Other abnormal tumor markers: Secondary | ICD-10-CM

## 2022-11-19 DIAGNOSIS — R102 Pelvic and perineal pain: Secondary | ICD-10-CM | POA: Insufficient documentation

## 2022-11-19 DIAGNOSIS — D259 Leiomyoma of uterus, unspecified: Secondary | ICD-10-CM

## 2022-11-19 DIAGNOSIS — E669 Obesity, unspecified: Secondary | ICD-10-CM

## 2022-11-19 DIAGNOSIS — N941 Unspecified dyspareunia: Secondary | ICD-10-CM | POA: Diagnosis not present

## 2022-11-19 MED ORDER — SENNOSIDES-DOCUSATE SODIUM 8.6-50 MG PO TABS: 2.0000 | ORAL_TABLET | Freq: Every day | ORAL | 0 refills | Status: DC

## 2022-11-19 MED ORDER — OXYCODONE HCL 5 MG PO TABS
5.0000 mg | ORAL_TABLET | ORAL | 0 refills | Status: DC | PRN
Start: 2022-11-19 — End: 2023-01-27

## 2022-11-19 MED ORDER — IBUPROFEN 800 MG PO TABS: 800.0000 mg | ORAL_TABLET | Freq: Three times a day (TID) | ORAL | 0 refills | Status: DC | PRN

## 2022-11-19 NOTE — Progress Notes (Signed)
Patient here for new patient consultation with Dr. Jeral Pinch and for a pre-operative appointment prior to her scheduled surgery on Jan 05, 2023. She is scheduled for robotic assisted laparoscopic unilateral salpingo-oophorectomy versus mass excision, possible staging including a total hysterectomy, possible myomectomy, possible laparotomy. The surgery was discussed in detail.  See after visit summary for additional details. Visual aids used to discuss items related to surgery.   Discussed post-op pain management in detail including the aspects of the enhanced recovery pathway.  Advised her that a new prescription would be sent in for oxycodone and it is only to be used for after her upcoming surgery.  We discussed the use of tylenol post-op and to monitor for a maximum of 4,000 mg in a 24 hour period.  Also prescribed sennakot to be used after surgery and to hold if having loose stools.  Discussed bowel regimen in detail.     Discussed the use of SCDs and measures to take at home to prevent DVT including frequent mobility.  Reportable signs and symptoms of DVT discussed. Post-operative instructions discussed and expectations for after surgery. Incisional care discussed as well including reportable signs and symptoms including erythema, drainage, wound separation.     10 minutes spent with the patient and preparing information.  Verbalizing understanding of material discussed. No needs or concerns voiced at the end of the visit.   Advised patient to call for any needs.  Advised that her post-operative medications had been prescribed and could be picked up at any time.    This appointment is included in the global surgical bundle as pre-operative teaching and has no charge.

## 2022-11-19 NOTE — Patient Instructions (Signed)
Preparing for your Surgery   Plan for surgery on Jan 05, 2023 with Dr. Jeral Pinch at [facility] will be scheduled for robotic assisted laparoscopic unilateral salpingo-oophorectomy (removal of one ovary and fallopian tube) versus mass excision, possible staging including a total hysterectomy (removal of the uterus and cervix), possible myomectomy (removal of fibroid), possible laparotomy (larger incision on the abdomen if needed).   We will check with our hematologist/oncologist to see if there is a need for a blood thinner after surgery and will let you know.   Pre-operative Testing -You will receive a phone call from presurgical testing at [facility] to arrange for a pre-operative appointment and lab work.   -Bring your insurance card, copy of an advanced directive if applicable, medication list   -At that visit, you will be asked to sign a consent for a possible blood transfusion in case a transfusion becomes necessary during surgery.  The need for a blood transfusion is rare but having consent is a necessary part of your care.      -You should not be taking blood thinners or aspirin at least ten days prior to surgery unless instructed by your surgeon.   -Do not take supplements such as fish oil (omega 3), red yeast rice, turmeric before your surgery. You want to avoid medications with aspirin in them including headache powders such as BC or Goody's), Excedrin migraine.   Day Before Surgery at [place] will be asked to take in a light diet the day before surgery. You will be advised you can have clear liquids up until 3 hours before your surgery.     Eat a light diet the day before surgery.  Examples including soups, broths, toast, yogurt, mashed potatoes.  AVOID GAS PRODUCING FOODS AND BEVERAGES. Things to avoid include carbonated beverages (fizzy beverages, sodas), raw fruits and raw vegetables (uncooked), or beans.    If your bowels are filled with gas,  your surgeon will have difficulty visualizing your pelvic organs which increases your surgical risks.   Your role in recovery Your role is to become active as soon as directed by your doctor, while still giving yourself time to heal.  Rest when you feel tired. You will be asked to do the following in order to speed your recovery:   - Cough and breathe deeply. This helps to clear and expand your lungs and can prevent pneumonia after surgery.  - [place]. Do mild physical activity. Walking or moving your legs help your circulation and body functions return to normal. Do not try to get up or walk alone the first time after surgery.   -If you develop swelling on one leg or the other, pain in the back of your leg, redness/warmth in one of your legs, please call the office or go to the Emergency Room to have a doppler to rule out a blood clot. For shortness of breath, chest pain-seek care in the Emergency Room as soon as possible. - Actively manage your pain. Managing your pain lets you move in comfort. We will ask you to rate your pain on a scale of zero to 10. It is your responsibility to tell your doctor or nurse where and how much you hurt so your pain can be treated.   Special Considerations -If you are diabetic, you may be placed on insulin after surgery to have closer control over your blood sugars to promote healing and recovery.  This does not  mean that you will be discharged on insulin.  If applicable, your oral antidiabetics will be resumed when you are tolerating a solid diet.   -Your final pathology results from surgery should be available around one week after surgery and the results will be relayed to you when available.   -FMLA forms can be faxed to [phone] and please allow 5-7 business days for completion.   Pain Management After Surgery -You have been prescribed your pain medication and bowel regimen medications before surgery so that you can have these  available when you are discharged from the hospital. The pain medication is for use ONLY AFTER surgery and a new prescription will not be given.    -Make sure that you have Tylenol and Ibuprofen IF YOU ARE ABLE TO TAKE THESE MEDICATIONS at home to use on a regular basis after surgery for pain control. We recommend alternating the medications every hour to six hours since they work differently and are processed in the body differently for pain relief.   -Review the attached handout on narcotic use and their risks and side effects.    Bowel Regimen -You have been prescribed Sennakot-S to take nightly to prevent constipation especially if you are taking the narcotic pain medication intermittently.  It is important to prevent constipation and drink adequate amounts of liquids. You can stop taking this medication when you are not taking pain medication and you are back on your normal bowel routine.   Risks of Surgery Risks of surgery are low but include bleeding, infection, damage to surrounding structures, re-operation, blood clots, and very rarely death.     Blood Transfusion Information (For the consent to be signed before surgery)   We will be checking your blood type before surgery so in case of emergencies, we will know what type of blood you would need.                                             WHAT IS A BLOOD TRANSFUSION?   A transfusion is the replacement of blood or some of its parts. Blood is made up of multiple cells which provide different functions. Red blood cells carry oxygen and are used for blood loss replacement. White blood cells fight against infection. Platelets control bleeding. Plasma helps clot blood. Other blood products are available for specialized needs, such as hemophilia or other clotting disorders. BEFORE THE TRANSFUSION  Who gives blood for transfusions?  You may be able to donate blood to be used at a later date on yourself (autologous donation). Relatives  can be asked to donate blood. This is generally not any safer than if you have received blood from a stranger. The same precautions are taken to ensure safety when a relative's blood is donated. Healthy volunteers who are fully evaluated to make sure their blood is safe. This is blood bank blood. Transfusion therapy is the safest it has ever been in the practice of medicine. Before blood is taken from a donor, a complete history is taken to make sure that person has no history of diseases nor engages in risky social behavior (examples are intravenous drug use or sexual activity with multiple partners). The donor's travel history is screened to minimize risk of transmitting infections, such as malaria. The donated blood is tested for signs of infectious diseases, such as HIV and hepatitis. The blood is then  tested to be sure it is compatible with you in order to minimize the chance of a transfusion reaction. If you or a relative donates blood, this is often done in anticipation of surgery and is not appropriate for emergency situations. It takes many days to process the donated blood. RISKS AND COMPLICATIONS Although transfusion therapy is very safe and saves many lives, the main dangers of transfusion include:  Getting an infectious disease. Developing a transfusion reaction. This is an allergic reaction to something in the blood you were given. Every precaution is taken to prevent this. The decision to have a blood transfusion has been considered carefully by your caregiver before blood is given. Blood is not given unless the benefits outweigh the risks.   AFTER SURGERY INSTRUCTIONS   Return to work: 4-6 weeks if applicable   Activity: 1. Be up and out of the bed during the day.  Take a nap if needed.  You may walk up steps but be careful and use the hand rail.  Stair climbing will tire you more than you think, you may need to stop part way and rest.    2. No lifting or straining for 6 weeks over 10  pounds. No pushing, pulling, straining for 6 weeks.   3. No driving for around 1 week(s).  Do not drive if you are taking narcotic pain medicine and make sure that your reaction time has returned.    4. You can shower as soon as the next day after surgery. Shower daily.  Use your regular soap and water (not directly on the incision) and pat your incision(s) dry afterwards; don't rub.  No tub baths or submerging your body in water until cleared by your surgeon. If you have the soap that was given to you by pre-surgical testing that was used before surgery, you do not need to use it afterwards because this can irritate your incisions.    5. No sexual activity and nothing in the vagina for 4-6 weeks, 10-12 weeks if you have a hysterectomy (removal of the uterus and cervix).   6. You may experience a small amount of clear drainage from your incisions, which is normal.  If the drainage persists, increases, or changes color please call the office.   7. Do not use creams, lotions, or ointments such as neosporin on your incisions after surgery until advised by your surgeon because they can cause removal of the dermabond glue on your incisions.     8. You may experience vaginal spotting after surgery or around the 6-8 week mark from surgery when the stitches at the top of the vagina begin to dissolve (if you have a hysterectomy).  The spotting is normal but if you experience heavy bleeding, call our office.   9. Take Tylenol or ibuprofen first for pain if you are able to take these medications and only use narcotic pain medication for severe pain not relieved by the Tylenol or Ibuprofen.  Monitor your Tylenol intake to a max of 4,000 mg in a 24 hour period. You can alternate these medications after surgery.   Diet: 1. Low sodium Heart Healthy Diet is recommended but you are cleared to resume your normal (before surgery) diet after your procedure.   2. It is safe to use a laxative, such as Miralax or  Colace, if you have difficulty moving your bowels. You have been prescribed Sennakot-S to take at bedtime every evening after surgery to keep bowel movements regular and to prevent constipation.  Wound Care: 1. Keep clean and dry.  Shower daily.   Reasons to call the Doctor: Fever - Oral temperature greater than 100.4 degrees Fahrenheit Foul-smelling vaginal discharge Difficulty urinating Nausea and vomiting Increased pain at the site of the incision that is unrelieved with pain medicine. Difficulty breathing with or without chest pain New calf pain especially if only on one side Sudden, continuing increased vaginal bleeding with or without clots.   Contacts: For questions or concerns you should contact:   Dr. Jeral Pinch at [phone]   Joylene John, NP at [phone]   After Hours: call [phone] and have the GYN Oncologist paged/contacted (after 5 pm or on the weekends). You will speak with an after hours RN and let he or she know you have had surgery.   Messages sent via mychart are for non-urgent matters and are not responded to after hours so for urgent needs, please call the after hours number.

## 2022-11-19 NOTE — Patient Instructions (Signed)
Preparing for your Surgery  Plan for surgery on Jan 05, 2023 with Dr. Jeral Pinch at [place] will be scheduled for robotic assisted laparoscopic unilateral salpingo-oophorectomy (removal of one ovary and fallopian tube) versus mass excision, possible staging including a total hysterectomy (removal of the uterus and cervix), possible myomectomy (removal of fibroid), possible laparotomy (larger incision on the abdomen if needed).  We will check with our hematologist/oncologist to see if there is a need for a blood thinner after surgery and will let you know.  Pre-operative Testing -You will receive a phone call from presurgical testing at [facility] to arrange for a pre-operative appointment and lab work.  -Bring your insurance card, copy of an advanced directive if applicable, medication list  -At that visit, you will be asked to sign a consent for a possible blood transfusion in case a transfusion becomes necessary during surgery.  The need for a blood transfusion is rare but having consent is a necessary part of your care.     -You should not be taking blood thinners or aspirin at least ten days prior to surgery unless instructed by your surgeon.  -Do not take supplements such as fish oil (omega 3), red yeast rice, turmeric before your surgery. You want to avoid medications with aspirin in them including headache powders such as BC or Goody's), Excedrin migraine.  Day Before Surgery at [place] will be asked to take in a light diet the day before surgery. You will be advised you can have clear liquids up until 3 hours before your surgery.    Eat a light diet the day before surgery.  Examples including soups, broths, toast, yogurt, mashed potatoes.  AVOID GAS PRODUCING FOODS AND BEVERAGES. Things to avoid include carbonated beverages (fizzy beverages, sodas), raw fruits and raw vegetables (uncooked), or beans.   If your bowels are filled with gas, your surgeon  will have difficulty visualizing your pelvic organs which increases your surgical risks.  Your role in recovery Your role is to become active as soon as directed by your doctor, while still giving yourself time to heal.  Rest when you feel tired. You will be asked to do the following in order to speed your recovery:  - Cough and breathe deeply. This helps to clear and expand your lungs and can prevent pneumonia after surgery.  - [place]. Do mild physical activity. Walking or moving your legs help your circulation and body functions return to normal. Do not try to get up or walk alone the first time after surgery.   -If you develop swelling on one leg or the other, pain in the back of your leg, redness/warmth in one of your legs, please call the office or go to the Emergency Room to have a doppler to rule out a blood clot. For shortness of breath, chest pain-seek care in the Emergency Room as soon as possible. - Actively manage your pain. Managing your pain lets you move in comfort. We will ask you to rate your pain on a scale of zero to 10. It is your responsibility to tell your doctor or nurse where and how much you hurt so your pain can be treated.  Special Considerations -If you are diabetic, you may be placed on insulin after surgery to have closer control over your blood sugars to promote healing and recovery.  This does not mean that you will be discharged on insulin.  If applicable, your oral  antidiabetics will be resumed when you are tolerating a solid diet.  -Your final pathology results from surgery should be available around one week after surgery and the results will be relayed to you when available.  -FMLA forms can be faxed to [phone] and please allow 5-7 business days for completion.  Pain Management After Surgery -You have been prescribed your pain medication and bowel regimen medications before surgery so that you can have these available when you are  discharged from the hospital. The pain medication is for use ONLY AFTER surgery and a new prescription will not be given.   -Make sure that you have Tylenol and Ibuprofen IF YOU ARE ABLE TO TAKE THESE MEDICATIONS at home to use on a regular basis after surgery for pain control. We recommend alternating the medications every hour to six hours since they work differently and are processed in the body differently for pain relief.  -Review the attached handout on narcotic use and their risks and side effects.   Bowel Regimen -You have been prescribed Sennakot-S to take nightly to prevent constipation especially if you are taking the narcotic pain medication intermittently.  It is important to prevent constipation and drink adequate amounts of liquids. You can stop taking this medication when you are not taking pain medication and you are back on your normal bowel routine.  Risks of Surgery Risks of surgery are low but include bleeding, infection, damage to surrounding structures, re-operation, blood clots, and very rarely death.   Blood Transfusion Information (For the consent to be signed before surgery)  We will be checking your blood type before surgery so in case of emergencies, we will know what type of blood you would need.                                            WHAT IS A BLOOD TRANSFUSION?  A transfusion is the replacement of blood or some of its parts. Blood is made up of multiple cells which provide different functions. Red blood cells carry oxygen and are used for blood loss replacement. White blood cells fight against infection. Platelets control bleeding. Plasma helps clot blood. Other blood products are available for specialized needs, such as hemophilia or other clotting disorders. BEFORE THE TRANSFUSION  Who gives blood for transfusions?  You may be able to donate blood to be used at a later date on yourself (autologous donation). Relatives can be asked to donate blood.  This is generally not any safer than if you have received blood from a stranger. The same precautions are taken to ensure safety when a relative's blood is donated. Healthy volunteers who are fully evaluated to make sure their blood is safe. This is blood bank blood. Transfusion therapy is the safest it has ever been in the practice of medicine. Before blood is taken from a donor, a complete history is taken to make sure that person has no history of diseases nor engages in risky social behavior (examples are intravenous drug use or sexual activity with multiple partners). The donor's travel history is screened to minimize risk of transmitting infections, such as malaria. The donated blood is tested for signs of infectious diseases, such as HIV and hepatitis. The blood is then tested to be sure it is compatible with you in order to minimize the chance of a transfusion reaction. If you or a relative donates  blood, this is often done in anticipation of surgery and is not appropriate for emergency situations. It takes many days to process the donated blood. RISKS AND COMPLICATIONS Although transfusion therapy is very safe and saves many lives, the main dangers of transfusion include:  Getting an infectious disease. Developing a transfusion reaction. This is an allergic reaction to something in the blood you were given. Every precaution is taken to prevent this. The decision to have a blood transfusion has been considered carefully by your caregiver before blood is given. Blood is not given unless the benefits outweigh the risks.  AFTER SURGERY INSTRUCTIONS  Return to work: 4-6 weeks if applicable  Activity: 1. Be up and out of the bed during the day.  Take a nap if needed.  You may walk up steps but be careful and use the hand rail.  Stair climbing will tire you more than you think, you may need to stop part way and rest.   2. No lifting or straining for 6 weeks over 10 pounds. No pushing, pulling,  straining for 6 weeks.  3. No driving for around 1 week(s).  Do not drive if you are taking narcotic pain medicine and make sure that your reaction time has returned.   4. You can shower as soon as the next day after surgery. Shower daily.  Use your regular soap and water (not directly on the incision) and pat your incision(s) dry afterwards; don't rub.  No tub baths or submerging your body in water until cleared by your surgeon. If you have the soap that was given to you by pre-surgical testing that was used before surgery, you do not need to use it afterwards because this can irritate your incisions.   5. No sexual activity and nothing in the vagina for 4-6 weeks, 10-12 weeks if you have a hysterectomy (removal of the uterus and cervix).  6. You may experience a small amount of clear drainage from your incisions, which is normal.  If the drainage persists, increases, or changes color please call the office.  7. Do not use creams, lotions, or ointments such as neosporin on your incisions after surgery until advised by your surgeon because they can cause removal of the dermabond glue on your incisions.    8. You may experience vaginal spotting after surgery or around the 6-8 week mark from surgery when the stitches at the top of the vagina begin to dissolve (if you have a hysterectomy).  The spotting is normal but if you experience heavy bleeding, call our office.  9. Take Tylenol or ibuprofen first for pain if you are able to take these medications and only use narcotic pain medication for severe pain not relieved by the Tylenol or Ibuprofen.  Monitor your Tylenol intake to a max of 4,000 mg in a 24 hour period. You can alternate these medications after surgery.  Diet: 1. Low sodium Heart Healthy Diet is recommended but you are cleared to resume your normal (before surgery) diet after your procedure.  2. It is safe to use a laxative, such as Miralax or Colace, if you have difficulty moving your  bowels. You have been prescribed Sennakot-S to take at bedtime every evening after surgery to keep bowel movements regular and to prevent constipation.    Wound Care: 1. Keep clean and dry.  Shower daily.  Reasons to call the Doctor: Fever - Oral temperature greater than 100.4 degrees Fahrenheit Foul-smelling vaginal discharge Difficulty urinating Nausea and vomiting Increased pain  at the site of the incision that is unrelieved with pain medicine. Difficulty breathing with or without chest pain New calf pain especially if only on one side Sudden, continuing increased vaginal bleeding with or without clots.   Contacts: For questions or concerns you should contact:  Dr. Jeral Pinch at [phone]  Joylene John, NP at [phone]  After Hours: call [phone] and have the GYN Oncologist paged/contacted (after 5 pm or on the weekends). You will speak with an after hours RN and let he or she know you have had surgery.  Messages sent via mychart are for non-urgent matters and are not responded to after hours so for urgent needs, please call the after hours number.

## 2022-11-22 ENCOUNTER — Other Ambulatory Visit: Payer: Self-pay | Admitting: Gynecologic Oncology

## 2022-11-22 DIAGNOSIS — N9489 Other specified conditions associated with female genital organs and menstrual cycle: Secondary | ICD-10-CM

## 2022-11-22 DIAGNOSIS — R978 Other abnormal tumor markers: Secondary | ICD-10-CM

## 2022-11-23 ENCOUNTER — Telehealth: Payer: Self-pay

## 2022-11-23 NOTE — Telephone Encounter (Signed)
LVM for Ms. Lewin to call office regarding message from Joylene John NP

## 2022-11-23 NOTE — Telephone Encounter (Signed)
-----   Message from Dorothyann Gibbs, NP sent at 11/23/2022  9:10 AM EDT ----- Please let the patient know that our Hematologist/Oncologist DID NOT feel that she needed a blood thinner after surgery given her hx of MTHFR mutation.

## 2022-11-23 NOTE — Telephone Encounter (Signed)
Pt is aware of message from Melissa Cross NP 

## 2022-12-30 NOTE — Progress Notes (Signed)
COVID Vaccine received:  [x]  No []  Yes Date of any COVID positive Test in last 90 days:  PCP Arna Medici MD Cardiologist -   Chest x-ray -07/13/21 EPIC  EKG - 04/30/21 EPIC  Stress Test -  ECHO -  Cardiac Cath -   Bowel Prep - []  No  []   Yes ______  Pacemaker / ICD device []  No []  Yes   Spinal Cord Stimulator:[]  No []  Yes       History of Sleep Apnea? []  No []  Yes   CPAP used?- []  No []  Yes    Does the patient monitor blood sugar?          []  No []  Yes  []  N/A  Patient has: []  NO Hx DM   []  Pre-DM                 []  DM1  []   DM2 Does patient have a [employer] or [employer]? []  No []  Yes   Fasting Blood Sugar Ranges-  Checks Blood Sugar _____ times a day  GLP1 agonist / usual dose -  GLP1 instructions:  SGLT-2 inhibitors / usual dose -  SGLT-2 instructions:   Blood Thinner / Instructions: Aspirin Instructions:  Comments:   Activity level: Patient is able / unable to climb a flight of stairs without difficulty; []  No CP  []  No SOB, but would have ___   Patient can / can not perform ADLs without assistance.   Anesthesia review:   Patient denies shortness of breath, fever, cough and chest pain at PAT appointment.  Patient verbalized understanding and agreement to the Pre-Surgical Instructions that were given to them at this PAT appointment. Patient was also educated of the need to review these PAT instructions again prior to his/her surgery.I reviewed the appropriate phone numbers to call if they have any and questions or concerns.

## 2022-12-30 NOTE — Patient Instructions (Addendum)
SURGICAL WAITING ROOM VISITATION  Patients having surgery or a procedure may have no more than 2 support people in the waiting area - these visitors may rotate.    Children under the age of 58 must have an adult with them who is not the patient.  Due to an increase in RSV and influenza rates and associated hospitalizations, children ages 22 and under may not visit patients in [facility] hospitals.  If the patient needs to stay at the hospital during part of their recovery, the visitor guidelines for inpatient rooms apply. Pre-op nurse will coordinate an appropriate time for 1 support person to accompany patient in pre-op.  This support person may not rotate.    Please refer to the [facility] website for the visitor guidelines for Inpatients (after your surgery is over and you are in a regular room).    Your procedure is scheduled on: 01/05/23   Report to [facility] Main Entrance    Report to admitting at 6:15 AM   Call this number if you have problems the morning of surgery [phone]   Do not eat food :After Midnight.   After Midnight you may have the following liquids until 5:30 AM DAY OF SURGERY  Water Non-Citrus Juices (without pulp, NO RED-Apple, White grape, White cranberry) Black Coffee (NO MILK/CREAM OR CREAMERS, sugar ok)  Clear Tea (NO MILK/CREAM OR CREAMERS, sugar ok) regular and decaf                             Plain Jell-O (NO RED)                                           Fruit ices (not with fruit pulp, NO RED)                                     Popsicles (NO RED)                                                               Sports drinks like Gatorade (NO RED)            FOLLOW BOWEL PREP AND ANY ADDITIONAL PRE OP INSTRUCTIONS YOU RECEIVED FROM YOUR SURGEON'S OFFICE!!!     Oral Hygiene is also important to reduce your risk of infection.                                    Remember - BRUSH YOUR TEETH THE MORNING OF SURGERY WITH YOUR REGULAR  TOOTHPASTE  DENTURES WILL BE REMOVED PRIOR TO SURGERY PLEASE DO NOT APPLY "Poly grip" OR ADHESIVES!!!   Take these medicines the morning of surgery with A SIP OF WATER: NP thyroid, Thyroid Armour                              You may not have any metal on your body including hair pins, jewelry, and body  piercing             Do not wear make-up, lotions, powders, perfumes or deodorant  Do not wear nail polish including gel and S&S, artificial/acrylic nails, or any other type of covering on natural nails including finger and toenails. If you have artificial nails, gel coating, etc. that needs to be removed by a nail salon please have this removed prior to surgery or surgery may need to be canceled/ delayed if the surgeon/ anesthesia feels like they are unable to be safely monitored.   Do not shave  48 hours prior to surgery.    Do not bring valuables to the hospital. [facility] IS NOT             RESPONSIBLE   FOR VALUABLES.   Contacts, glasses, dentures or bridgework may not be worn into surgery.   Bring small overnight bag day of surgery.   DO NOT BRING YOUR HOME MEDICATIONS TO THE HOSPITAL. PHARMACY WILL DISPENSE MEDICATIONS LISTED ON YOUR MEDICATION LIST TO YOU DURING YOUR ADMISSION IN THE HOSPITAL!    Patients discharged on the day of surgery will not be allowed to drive home.  Someone NEEDS to stay with you for the first 24 hours after anesthesia.              Please read over the following fact sheets you were given: IF YOU HAVE QUESTIONS ABOUT YOUR PRE-OP INSTRUCTIONS PLEASE CALL [phone] Contras    If you received a COVID test during your pre-op visit  it is requested that you wear a mask when out in public, stay away from anyone that may not be feeling well and notify your surgeon if you develop symptoms. If you test positive for Covid or have been in contact with anyone that has tested positive in the last 10 days please notify you surgeon.    [facility] - Preparing for  Surgery Before surgery, you can play an important role.  Because skin is not sterile, your skin needs to be as free of germs as possible.  You can reduce the number of germs on your skin by washing with CHG (chlorahexidine gluconate) soap before surgery.  CHG is an antiseptic cleaner which kills germs and bonds with the skin to continue killing germs even after washing. Please DO NOT use if you have an allergy to CHG or antibacterial soaps.  If your skin becomes reddened/irritated stop using the CHG and inform your nurse when you arrive at Short Stay. Do not shave (including legs and underarms) for at least 48 hours prior to the first CHG shower.  You may shave your face/neck.  Please follow these instructions carefully:  1.  Shower with CHG Soap the night before surgery and the  morning of surgery.  2.  If you choose to wash your hair, wash your hair first as usual with your normal  shampoo.  3.  After you shampoo, rinse your hair and body thoroughly to remove the shampoo.                             4.  Use CHG as you would any other liquid soap.  You can apply chg directly to the skin and wash.  Gently with a scrungie or clean washcloth.  5.  Apply the CHG Soap to your body ONLY FROM THE NECK DOWN.   Do   not use on face/ open  Wound or open sores. Avoid contact with eyes, ears mouth and   genitals (private parts).                       Wash face,  Genitals (private parts) with your normal soap.             6.  Wash thoroughly, paying special attention to the area where your    surgery  will be performed.  7.  Thoroughly rinse your body with warm water from the neck down.  8.  DO NOT shower/wash with your normal soap after using and rinsing off the CHG Soap.                9.  Pat yourself dry with a clean towel.            10.  Wear clean pajamas.            11.  Place clean sheets on your bed the night of your first shower and do not  sleep with pets. Day of Surgery  : Do not apply any lotions/deodorants the morning of surgery.  Please wear clean clothes to the hospital/surgery center.  FAILURE TO FOLLOW THESE INSTRUCTIONS MAY RESULT IN THE CANCELLATION OF YOUR SURGERY  PATIENT SIGNATURE_________________________________  NURSE SIGNATURE__________________________________  ________________________________________________________________________ WHAT IS A BLOOD TRANSFUSION? Blood Transfusion Information  A transfusion is the replacement of blood or some of its parts. Blood is made up of multiple cells which provide different functions. Red blood cells carry oxygen and are used for blood loss replacement. White blood cells fight against infection. Platelets control bleeding. Plasma helps clot blood. Other blood products are available for specialized needs, such as hemophilia or other clotting disorders. BEFORE THE TRANSFUSION  Who gives blood for transfusions?  Healthy volunteers who are fully evaluated to make sure their blood is safe. This is blood bank blood. Transfusion therapy is the safest it has ever been in the practice of medicine. Before blood is taken from a donor, a complete history is taken to make sure that person has no history of diseases nor engages in risky social behavior (examples are intravenous drug use or sexual activity with multiple partners). The donor's travel history is screened to minimize risk of transmitting infections, such as malaria. The donated blood is tested for signs of infectious diseases, such as HIV and hepatitis. The blood is then tested to be sure it is compatible with you in order to minimize the chance of a transfusion reaction. If you or a relative donates blood, this is often done in anticipation of surgery and is not appropriate for emergency situations. It takes many days to process the donated blood. RISKS AND COMPLICATIONS Although transfusion therapy is very safe and saves many lives, the main dangers of  transfusion include:  Getting an infectious disease. Developing a transfusion reaction. This is an allergic reaction to something in the blood you were given. Every precaution is taken to prevent this. The decision to have a blood transfusion has been considered carefully by your caregiver before blood is given. Blood is not given unless the benefits outweigh the risks. AFTER THE TRANSFUSION Right after receiving a blood transfusion, you will usually feel much better and more energetic. This is especially true if your red blood cells have gotten low (anemic). The transfusion raises the level of the red blood cells which carry oxygen, and this usually causes an energy increase. The nurse administering the transfusion will  monitor you carefully for complications. HOME CARE INSTRUCTIONS  No special instructions are needed after a transfusion. You may find your energy is better. Speak with your caregiver about any limitations on activity for underlying diseases you may have. SEEK MEDICAL CARE IF:  Your condition is not improving after your transfusion. You develop redness or irritation at the intravenous (IV) site. SEEK IMMEDIATE MEDICAL CARE IF:  Any of the following symptoms occur over the next 12 hours: Shaking chills. You have a temperature by mouth above 102 F (38.9 C), not controlled by medicine. Chest, back, or muscle pain. People around you feel you are not acting correctly or are confused. Shortness of breath or difficulty breathing. Dizziness and fainting. You get a rash or develop hives. You have a decrease in urine output. Your urine turns a dark color or changes to pink, red, or brown. Any of the following symptoms occur over the next 10 days: You have a temperature by mouth above 102 F (38.9 C), not controlled by medicine. Shortness of breath. Weakness after normal activity. The white part of the eye turns yellow (jaundice). You have a decrease in the amount of urine or are  urinating less often. Your urine turns a dark color or changes to pink, red, or brown. Document Released: 08/06/2000 Document Revised: 11/01/2011 Document Reviewed: 03/25/2008 [facility] Patient Information 2014 [place], [place].

## 2022-12-31 ENCOUNTER — Other Ambulatory Visit: Payer: Self-pay

## 2022-12-31 ENCOUNTER — Encounter (HOSPITAL_COMMUNITY)
Admission: RE | Admit: 2022-12-31 | Discharge: 2022-12-31 | Disposition: A | Discharge status: Home / Self Care | Payer: Federal, State, Local not specified - PPO | Source: Ambulatory Visit | Attending: Gynecologic Oncology | Admitting: Gynecologic Oncology

## 2022-12-31 ENCOUNTER — Encounter (HOSPITAL_COMMUNITY): Payer: Self-pay

## 2022-12-31 VITALS — BP 148/92 | HR 78 | Temp 98.5°F | Resp 16 | Ht 65.0 in | Wt 209.9 lb

## 2022-12-31 DIAGNOSIS — N83209 Unspecified ovarian cyst, unspecified side: Secondary | ICD-10-CM

## 2022-12-31 DIAGNOSIS — Z01818 Encounter for other preprocedural examination: Secondary | ICD-10-CM | POA: Diagnosis not present

## 2022-12-31 LAB — BASIC METABOLIC PANEL
Anion gap: 9 (ref 5–15)
BUN: 11 mg/dL (ref 6–20)
CO2: 25 mmol/L (ref 22–32)
Calcium: 9 mg/dL (ref 8.9–10.3)
Chloride: 105 mmol/L (ref 98–111)
Creatinine, Ser: 0.58 mg/dL (ref 0.44–1.00)
GFR, Estimated: >60 mL/min (ref >60)
Glucose, Bld: 93 mg/dL (ref 70–99)
Potassium: 4.2 mmol/L (ref 3.5–5.1)
Sodium: 139 mmol/L (ref 135–145)

## 2022-12-31 LAB — CBC
HCT: 43.7 % (ref 36.0–46.0)
Hemoglobin: 14.5 g/dL (ref 12.0–15.0)
MCH: 26.7 pg (ref 26.0–34.0)
MCHC: 33.2 g/dL (ref 30.0–36.0)
MCV: 80.5 fL (ref 80.0–100.0)
Platelets: 283 10*3/uL (ref 150–400)
RBC: 5.43 MIL/uL — ABNORMAL HIGH (ref 3.87–5.11)
RDW: 13.2 % (ref 11.5–15.5)
WBC: 7 10*3/uL (ref 4.0–10.5)
nRBC: 0 % (ref 0.0–0.2)

## 2022-12-31 NOTE — Progress Notes (Addendum)
COVID Vaccine Completed: yes  Date of COVID positive in last 90 days: no  PCP - Bari Edward, MD Cardiologist - n/a  Chest x-ray - n/a EKG - 12/31/22 Epic/chart Stress Test - n/a ECHO - n/a Cardiac Cath - n/a Pacemaker/ICD device last checked: n/a Spinal Cord Stimulator: n/a  Bowel Prep - light diet day of surgery  Sleep Study - n/a CPAP -   Fasting Blood Sugar - n/a Checks Blood Sugar _____ times a day  Last dose of GLP1 agonist-  N/A GLP1 instructions:  N/A   Last dose of SGLT-2 inhibitors-  N/A SGLT-2 instructions: N/A   Blood Thinner Instructions: n/a Aspirin Instructions: Last Dose:  Activity level: Can go up a flight of stairs and perform activities of daily living without stopping and without symptoms of chest pain or shortness of breath.   Anesthesia review: BP 153/106, 152/104, and 148/92 at PAT. Patient denies hx of HTN but does report her BP has been elevated in the past. Never been on any BP meds and denies any symptoms. Obtained EKG and instructed patient to let PCP know Bps from today. Instructed to monitor BP over weekend and let PCP know if still elevated. Patient verbalized understanding.   Patient denies shortness of breath, fever, cough and chest pain at PAT appointment  Patient verbalized understanding of instructions that were given to them at the PAT appointment. Patient was also instructed that they will need to review over the PAT instructions again at home before surgery.

## 2023-01-04 ENCOUNTER — Telehealth: Payer: Self-pay | Admitting: *Deleted

## 2023-01-04 NOTE — Telephone Encounter (Signed)
Telephone call to check on pre-operative status.  Patient compliant with pre-operative instructions.  Reinforced nothing to eat after midnight. Clear liquids until 0515. Patient to arrive at 0615.  No questions or concerns voiced.  Instructed to call for any needs.  ?

## 2023-01-04 NOTE — Telephone Encounter (Signed)
Attempted to notify patient for pre-op call. Left voicemail for pt to call office at [phone].

## 2023-01-04 NOTE — Anesthesia Preprocedure Evaluation (Addendum)
Anesthesia Evaluation  Patient identified by MRN, date of birth, ID band Patient awake    Reviewed: Allergy & Precautions, NPO status , Patient's Chart, lab work & pertinent test results  History of Anesthesia Complications (+) PONV and history of anesthetic complications  Airway Mallampati: II  TM Distance: >3 FB Neck ROM: Full    Dental no notable dental hx. (+) Implants   Pulmonary    Pulmonary exam normal breath sounds clear to auscultation       Cardiovascular Normal cardiovascular exam Rhythm:Regular Rate:Normal     Neuro/Psych  Headaches    GI/Hepatic   Endo/Other  Hypothyroidism    Renal/GU Lab Results      Component                Value               Date                      CREATININE               0.58                12/31/2022                BUN                      11                  12/31/2022                NA                       139                 12/31/2022                K                        4.2                 12/31/2022                CL                       105                 12/31/2022                CO2                      25                  12/31/2022                Musculoskeletal   Abdominal   Peds  Hematology Lab Results      Component                Value               Date                      WBC                      7.0  12/31/2022                HGB                      14.5                12/31/2022                HCT                      43.7                12/31/2022                MCV                      80.5                12/31/2022                PLT                      283                 12/31/2022              Anesthesia Other Findings All: metformin  Reproductive/Obstetrics negative OB ROS                             Anesthesia Physical Anesthesia Plan  ASA: 3  Anesthesia Plan: General   Post-op Pain  Management: Ketamine IV*, Lidocaine infusion* and Toradol IV (intra-op)*   Induction: Intravenous  PONV Risk Score and Plan: 4 or greater and Midazolam, Ondansetron, Aprepitant, Dexamethasone, Treatment may vary due to age or medical condition and Scopolamine patch - Pre-op  Airway Management Planned: Oral ETT  Additional Equipment: None  Intra-op Plan:   Post-operative Plan: Extubation in OR  Informed Consent: I have reviewed the patients History and Physical, chart, labs and discussed the procedure including the risks, benefits and alternatives for the proposed anesthesia with the patient or authorized representative who has indicated his/her understanding and acceptance.     Dental advisory given  Plan Discussed with: CRNA and Anesthesiologist  Anesthesia Plan Comments:        Anesthesia Quick Evaluation

## 2023-01-05 ENCOUNTER — Other Ambulatory Visit: Payer: Self-pay

## 2023-01-05 ENCOUNTER — Ambulatory Visit (HOSPITAL_COMMUNITY): Payer: Federal, State, Local not specified - PPO | Admitting: Anesthesiology

## 2023-01-05 ENCOUNTER — Ambulatory Visit (HOSPITAL_COMMUNITY): Payer: Federal, State, Local not specified - PPO | Admitting: Physician Assistant

## 2023-01-05 ENCOUNTER — Encounter (HOSPITAL_COMMUNITY): Payer: Self-pay | Admitting: Gynecologic Oncology

## 2023-01-05 ENCOUNTER — Ambulatory Visit (HOSPITAL_COMMUNITY)
Admission: RE | Admit: 2023-01-05 | Discharge: 2023-01-06 | Disposition: A | Discharge status: Home / Self Care | Payer: Federal, State, Local not specified - PPO | Attending: Gynecologic Oncology | Admitting: Gynecologic Oncology

## 2023-01-05 ENCOUNTER — Encounter (HOSPITAL_COMMUNITY)
Admission: RE | Disposition: A | Discharge status: Home / Self Care | Payer: Self-pay | Source: Home / Self Care | Attending: Gynecologic Oncology

## 2023-01-05 DIAGNOSIS — N8003 Adenomyosis of the uterus: Secondary | ICD-10-CM | POA: Insufficient documentation

## 2023-01-05 DIAGNOSIS — N809 Endometriosis, unspecified: Secondary | ICD-10-CM

## 2023-01-05 DIAGNOSIS — D271 Benign neoplasm of left ovary: Secondary | ICD-10-CM | POA: Insufficient documentation

## 2023-01-05 DIAGNOSIS — N83209 Unspecified ovarian cyst, unspecified side: Secondary | ICD-10-CM

## 2023-01-05 DIAGNOSIS — N9489 Other specified conditions associated with female genital organs and menstrual cycle: Secondary | ICD-10-CM | POA: Diagnosis not present

## 2023-01-05 DIAGNOSIS — Z90710 Acquired absence of both cervix and uterus: Secondary | ICD-10-CM | POA: Insufficient documentation

## 2023-01-05 DIAGNOSIS — R971 Elevated cancer antigen 125 [CA 125]: Secondary | ICD-10-CM | POA: Diagnosis not present

## 2023-01-05 DIAGNOSIS — R978 Other abnormal tumor markers: Secondary | ICD-10-CM

## 2023-01-05 DIAGNOSIS — N736 Female pelvic peritoneal adhesions (postinfective): Secondary | ICD-10-CM | POA: Diagnosis not present

## 2023-01-05 DIAGNOSIS — D259 Leiomyoma of uterus, unspecified: Secondary | ICD-10-CM | POA: Diagnosis not present

## 2023-01-05 DIAGNOSIS — R34 Anuria and oliguria: Secondary | ICD-10-CM

## 2023-01-05 DIAGNOSIS — N888 Other specified noninflammatory disorders of cervix uteri: Secondary | ICD-10-CM | POA: Diagnosis not present

## 2023-01-05 DIAGNOSIS — N8312 Corpus luteum cyst of left ovary: Secondary | ICD-10-CM | POA: Diagnosis not present

## 2023-01-05 DIAGNOSIS — J029 Acute pharyngitis, unspecified: Secondary | ICD-10-CM

## 2023-01-05 DIAGNOSIS — E039 Hypothyroidism, unspecified: Secondary | ICD-10-CM | POA: Diagnosis not present

## 2023-01-05 DIAGNOSIS — N838 Other noninflammatory disorders of ovary, fallopian tube and broad ligament: Secondary | ICD-10-CM | POA: Diagnosis not present

## 2023-01-05 HISTORY — PX: ROBOTIC ASSISTED TOTAL HYSTERECTOMY WITH BILATERAL SALPINGO OOPHERECTOMY: SHX6086

## 2023-01-05 LAB — CBC
HCT: 36.4 % (ref 36.0–46.0)
Hemoglobin: 12.2 g/dL (ref 12.0–15.0)
MCH: 26.8 pg (ref 26.0–34.0)
MCHC: 33.5 g/dL (ref 30.0–36.0)
MCV: 79.8 fL — ABNORMAL LOW (ref 80.0–100.0)
Platelets: 240 10*3/uL (ref 150–400)
RBC: 4.56 MIL/uL (ref 3.87–5.11)
RDW: 13.3 % (ref 11.5–15.5)
WBC: 10.5 10*3/uL (ref 4.0–10.5)
nRBC: 0 % (ref 0.0–0.2)

## 2023-01-05 LAB — BASIC METABOLIC PANEL
Anion gap: 7 (ref 5–15)
BUN: 12 mg/dL (ref 6–20)
CO2: 24 mmol/L (ref 22–32)
Calcium: 7.9 mg/dL — ABNORMAL LOW (ref 8.9–10.3)
Chloride: 100 mmol/L (ref 98–111)
Creatinine, Ser: 0.65 mg/dL (ref 0.44–1.00)
GFR, Estimated: >60 mL/min (ref >60)
Glucose, Bld: 182 mg/dL — ABNORMAL HIGH (ref 70–99)
Potassium: 4 mmol/L (ref 3.5–5.1)
Sodium: 131 mmol/L — ABNORMAL LOW (ref 135–145)

## 2023-01-05 LAB — TYPE AND SCREEN
ABO/RH(D): A POS
Antibody Screen: NEGATIVE

## 2023-01-05 LAB — POCT PREGNANCY, URINE: Preg Test, Ur: NEGATIVE

## 2023-01-05 SURGERY — HYSTERECTOMY, TOTAL, ROBOT-ASSISTED, LAPAROSCOPIC, WITH BILATERAL SALPINGO-OOPHORECTOMY
Anesthesia: General

## 2023-01-05 MED ORDER — SODIUM CHLORIDE 0.9 % IV BOLUS
500.0000 mL | Freq: Once | INTRAVENOUS | Status: AC
  Administered 2023-01-05: 500 mL via INTRAVENOUS

## 2023-01-05 MED ORDER — GLYCOPYRROLATE 0.2 MG/ML IJ SOLN
INTRAMUSCULAR | Status: DC | PRN
  Administered 2023-01-05: .2 mg via INTRAVENOUS

## 2023-01-05 MED ORDER — OXYCODONE HCL 5 MG PO TABS
5.0000 mg | ORAL_TABLET | ORAL | Status: DC | PRN
  Administered 2023-01-05 – 2023-01-06 (×4): 10 mg via ORAL
  Filled 2023-01-05 (×4): qty 2

## 2023-01-05 MED ORDER — KETAMINE HCL 10 MG/ML IJ SOLN
INTRAMUSCULAR | Status: DC | PRN
  Administered 2023-01-05: 20 mg via INTRAVENOUS
  Administered 2023-01-05: 30 mg via INTRAVENOUS

## 2023-01-05 MED ORDER — KETAMINE HCL 50 MG/5ML IJ SOSY
PREFILLED_SYRINGE | INTRAMUSCULAR | Status: AC
  Filled 2023-01-05: qty 5

## 2023-01-05 MED ORDER — GABAPENTIN 300 MG PO CAPS
300.0000 mg | ORAL_CAPSULE | ORAL | Status: AC
  Administered 2023-01-05: 300 mg via ORAL
  Filled 2023-01-05: qty 1

## 2023-01-05 MED ORDER — KETOROLAC TROMETHAMINE 15 MG/ML IJ SOLN: 15.0000 mg | INTRAMUSCULAR | Status: DC

## 2023-01-05 MED ORDER — PHENYLEPHRINE HCL (PRESSORS) 10 MG/ML IV SOLN
INTRAVENOUS | Status: AC
  Filled 2023-01-05: qty 1

## 2023-01-05 MED ORDER — THYROID 60 MG PO TABS: 60.0000 mg | ORAL_TABLET | Freq: Every day | ORAL | Status: DC

## 2023-01-05 MED ORDER — SCOPOLAMINE 1 MG/3DAYS TD PT72
1.0000 | MEDICATED_PATCH | TRANSDERMAL | Status: DC
  Administered 2023-01-05: 1.5 mg via TRANSDERMAL
  Filled 2023-01-05: qty 1

## 2023-01-05 MED ORDER — FENTANYL CITRATE (PF) 250 MCG/5ML IJ SOLN
INTRAMUSCULAR | Status: AC
  Filled 2023-01-05: qty 5

## 2023-01-05 MED ORDER — DEXAMETHASONE SODIUM PHOSPHATE 10 MG/ML IJ SOLN
INTRAMUSCULAR | Status: AC
  Filled 2023-01-05: qty 1

## 2023-01-05 MED ORDER — ROCURONIUM BROMIDE 10 MG/ML (PF) SYRINGE
PREFILLED_SYRINGE | INTRAVENOUS | Status: AC
  Filled 2023-01-05: qty 10

## 2023-01-05 MED ORDER — APREPITANT 40 MG PO CAPS
40.0000 mg | ORAL_CAPSULE | Freq: Once | ORAL | Status: AC
  Administered 2023-01-05: 40 mg via ORAL
  Filled 2023-01-05: qty 1

## 2023-01-05 MED ORDER — MIDAZOLAM HCL 2 MG/2ML IJ SOLN
INTRAMUSCULAR | Status: AC
  Filled 2023-01-05: qty 2

## 2023-01-05 MED ORDER — LIDOCAINE HCL 2 % IJ SOLN
INTRAMUSCULAR | Status: AC
  Filled 2023-01-05: qty 40

## 2023-01-05 MED ORDER — HYDROMORPHONE HCL 1 MG/ML IJ SOLN
0.2500 mg | INTRAMUSCULAR | Status: DC | PRN
  Administered 2023-01-05 (×4): 0.5 mg via INTRAVENOUS

## 2023-01-05 MED ORDER — PROPOFOL 10 MG/ML IV BOLUS
INTRAVENOUS | Status: AC
  Filled 2023-01-05: qty 20

## 2023-01-05 MED ORDER — MIDAZOLAM HCL 5 MG/5ML IJ SOLN
INTRAMUSCULAR | Status: DC | PRN
  Administered 2023-01-05: 2 mg via INTRAVENOUS

## 2023-01-05 MED ORDER — CHLORHEXIDINE GLUCONATE 0.12 % MT SOLN
15.0000 mL | Freq: Once | OROMUCOSAL | Status: AC
  Administered 2023-01-05: 15 mL via OROMUCOSAL

## 2023-01-05 MED ORDER — LIDOCAINE HCL (PF) 2 % IJ SOLN
INTRAMUSCULAR | Status: AC
  Filled 2023-01-05: qty 35

## 2023-01-05 MED ORDER — ROCURONIUM BROMIDE 10 MG/ML (PF) SYRINGE
PREFILLED_SYRINGE | INTRAVENOUS | Status: DC | PRN
  Administered 2023-01-05: 60 mg via INTRAVENOUS
  Administered 2023-01-05: 20 mg via INTRAVENOUS
  Administered 2023-01-05: 40 mg via INTRAVENOUS
  Administered 2023-01-05: 20 mg via INTRAVENOUS

## 2023-01-05 MED ORDER — SENNOSIDES-DOCUSATE SODIUM 8.6-50 MG PO TABS
2.0000 | ORAL_TABLET | Freq: Every day | ORAL | Status: DC
  Administered 2023-01-05: 2 via ORAL
  Filled 2023-01-05: qty 2

## 2023-01-05 MED ORDER — METRONIDAZOLE 500 MG/100ML IV SOLN
500.0000 mg | INTRAVENOUS | Status: AC
  Administered 2023-01-05: 500 mg via INTRAVENOUS
  Filled 2023-01-05: qty 100

## 2023-01-05 MED ORDER — DEXAMETHASONE SODIUM PHOSPHATE 4 MG/ML IJ SOLN
4.0000 mg | INTRAMUSCULAR | Status: AC
  Administered 2023-01-05: 10 mg via INTRAVENOUS

## 2023-01-05 MED ORDER — ACETAMINOPHEN 500 MG PO TABS
1000.0000 mg | ORAL_TABLET | ORAL | Status: AC
  Administered 2023-01-05: 1000 mg via ORAL
  Filled 2023-01-05: qty 2

## 2023-01-05 MED ORDER — ONDANSETRON HCL 4 MG/2ML IJ SOLN: 4.0000 mg | Freq: Once | INTRAMUSCULAR | Status: DC | PRN

## 2023-01-05 MED ORDER — OXYCODONE HCL 5 MG/5ML PO SOLN: 5.0000 mg | Freq: Once | ORAL | Status: DC | PRN

## 2023-01-05 MED ORDER — BUPIVACAINE HCL 0.25 % IJ SOLN
INTRAMUSCULAR | Status: AC
  Filled 2023-01-05: qty 1

## 2023-01-05 MED ORDER — HEPARIN SODIUM (PORCINE) 5000 UNIT/ML IJ SOLN
5000.0000 [IU] | INTRAMUSCULAR | Status: AC
  Administered 2023-01-05: 5000 [IU] via SUBCUTANEOUS
  Filled 2023-01-05: qty 1

## 2023-01-05 MED ORDER — STERILE WATER FOR IRRIGATION IR SOLN
Status: DC | PRN
  Administered 2023-01-05: 1000 mL

## 2023-01-05 MED ORDER — KETOROLAC TROMETHAMINE 30 MG/ML IJ SOLN
30.0000 mg | Freq: Once | INTRAMUSCULAR | Status: AC | PRN
  Administered 2023-01-05: 30 mg via INTRAVENOUS

## 2023-01-05 MED ORDER — CEFAZOLIN SODIUM-DEXTROSE 2-4 GM/100ML-% IV SOLN
2.0000 g | INTRAVENOUS | Status: AC
  Administered 2023-01-05: 2 g via INTRAVENOUS
  Filled 2023-01-05: qty 100

## 2023-01-05 MED ORDER — ONDANSETRON HCL 4 MG/2ML IJ SOLN
INTRAMUSCULAR | Status: DC | PRN
  Administered 2023-01-05: 4 mg via INTRAVENOUS

## 2023-01-05 MED ORDER — ONDANSETRON HCL 4 MG PO TABS: 4.0000 mg | ORAL_TABLET | Freq: Four times a day (QID) | ORAL | Status: DC | PRN

## 2023-01-05 MED ORDER — BUPIVACAINE HCL 0.25 % IJ SOLN
INTRAMUSCULAR | Status: DC | PRN
  Administered 2023-01-05: 31 mL

## 2023-01-05 MED ORDER — ONDANSETRON HCL 4 MG/2ML IJ SOLN: 4.0000 mg | Freq: Four times a day (QID) | INTRAMUSCULAR | Status: DC | PRN

## 2023-01-05 MED ORDER — ENOXAPARIN SODIUM 40 MG/0.4ML IJ SOSY: 40.0000 mg | PREFILLED_SYRINGE | INTRAMUSCULAR | Status: DC

## 2023-01-05 MED ORDER — PROPOFOL 10 MG/ML IV BOLUS
INTRAVENOUS | Status: DC | PRN
  Administered 2023-01-05: 150 mg via INTRAVENOUS

## 2023-01-05 MED ORDER — THYROID 60 MG PO TABS
90.0000 mg | ORAL_TABLET | Freq: Every day | ORAL | Status: DC
  Administered 2023-01-06: 90 mg via ORAL
  Filled 2023-01-05: qty 1

## 2023-01-05 MED ORDER — SUCCINYLCHOLINE CHLORIDE 200 MG/10ML IV SOSY
PREFILLED_SYRINGE | INTRAVENOUS | Status: AC
  Filled 2023-01-05: qty 10

## 2023-01-05 MED ORDER — ONDANSETRON HCL 4 MG/2ML IJ SOLN
INTRAMUSCULAR | Status: AC
  Filled 2023-01-05: qty 2

## 2023-01-05 MED ORDER — KCL IN DEXTROSE-NACL 10-5-0.45 MEQ/L-%-% IV SOLN
INTRAVENOUS | Status: DC
  Filled 2023-01-05 (×2): qty 1000

## 2023-01-05 MED ORDER — HYDROMORPHONE HCL 1 MG/ML IJ SOLN
0.2000 mg | INTRAMUSCULAR | Status: DC | PRN
  Administered 2023-01-05 – 2023-01-06 (×2): 0.6 mg via INTRAVENOUS
  Filled 2023-01-05 (×2): qty 1

## 2023-01-05 MED ORDER — LIDOCAINE 2% (20 MG/ML) 5 ML SYRINGE
INTRAMUSCULAR | Status: DC | PRN
  Administered 2023-01-05: 1.5 mg/kg/h via INTRAVENOUS
  Administered 2023-01-05: 100 mg via INTRAVENOUS

## 2023-01-05 MED ORDER — OXYCODONE HCL 5 MG PO TABS: 5.0000 mg | ORAL_TABLET | Freq: Once | ORAL | Status: DC | PRN

## 2023-01-05 MED ORDER — HYDROMORPHONE HCL 1 MG/ML IJ SOLN
INTRAMUSCULAR | Status: AC
  Filled 2023-01-05: qty 1

## 2023-01-05 MED ORDER — ORAL CARE MOUTH RINSE: 15.0000 mL | Freq: Once | OROMUCOSAL | Status: AC

## 2023-01-05 MED ORDER — TRAMADOL HCL 50 MG PO TABS
50.0000 mg | ORAL_TABLET | Freq: Four times a day (QID) | ORAL | Status: DC | PRN
  Filled 2023-01-05: qty 1

## 2023-01-05 MED ORDER — KETOROLAC TROMETHAMINE 30 MG/ML IJ SOLN
INTRAMUSCULAR | Status: AC
  Filled 2023-01-05: qty 1

## 2023-01-05 MED ORDER — LACTATED RINGERS IV SOLN: INTRAVENOUS | Status: DC

## 2023-01-05 MED ORDER — LACTATED RINGERS IV SOLN: INTRAVENOUS | Status: DC | PRN

## 2023-01-05 MED ORDER — HEMOSTATIC AGENTS (NO CHARGE) OPTIME
TOPICAL | Status: DC | PRN
  Administered 2023-01-05: 1 via TOPICAL

## 2023-01-05 MED ORDER — MENTHOL 3 MG MT LOZG
1.0000 | LOZENGE | OROMUCOSAL | Status: DC | PRN
  Administered 2023-01-05: 3 mg via ORAL
  Filled 2023-01-05: qty 9

## 2023-01-05 MED ORDER — THYROID 60 MG PO TABS: 90.0000 mg | ORAL_TABLET | Freq: Every day | ORAL | Status: DC

## 2023-01-05 MED ORDER — SUGAMMADEX SODIUM 200 MG/2ML IV SOLN
INTRAVENOUS | Status: DC | PRN
  Administered 2023-01-05: 200 mg via INTRAVENOUS

## 2023-01-05 MED ORDER — FENTANYL CITRATE (PF) 100 MCG/2ML IJ SOLN
INTRAMUSCULAR | Status: DC | PRN
  Administered 2023-01-05 (×3): 50 ug via INTRAVENOUS
  Administered 2023-01-05: 100 ug via INTRAVENOUS

## 2023-01-05 MED ORDER — LACTATED RINGERS IR SOLN
Status: DC | PRN
  Administered 2023-01-05: 1000 mL

## 2023-01-05 SURGICAL SUPPLY — 82 items
ADH SKN CLS APL DERMABOND .7 (GAUZE/BANDAGES/DRESSINGS) ×1
AGENT HMST KT MTR STRL THRMB (HEMOSTASIS) ×1
APL ESCP 34 STRL LF DISP (HEMOSTASIS) ×1
APL SRG 38 LTWT LNG FL B (MISCELLANEOUS) ×1
APPLICATOR ARISTA FLEXITIP XL (MISCELLANEOUS) IMPLANT
APPLICATOR SURGIFLO ENDO (HEMOSTASIS) IMPLANT
BAG COUNTER SPONGE SURGICOUNT (BAG) IMPLANT
BAG LAPAROSCOPIC 12 15 PORT 16 (BASKET) IMPLANT
BAG RETRIEVAL 12/15 (BASKET) ×1
BAG SPNG CNTER NS LX DISP (BAG)
BLADE SURG SZ10 CARB STEEL (BLADE) IMPLANT
COVER BACK TABLE 60X90IN (DRAPES) ×1 IMPLANT
COVER TIP SHEARS 8 DVNC (MISCELLANEOUS) ×1 IMPLANT
DERMABOND ADVANCED .7 DNX12 (GAUZE/BANDAGES/DRESSINGS) ×1 IMPLANT
DRAPE ARM DVNC X/XI (DISPOSABLE) ×4 IMPLANT
DRAPE COLUMN DVNC XI (DISPOSABLE) ×1 IMPLANT
DRAPE SHEET LG 3/4 BI-LAMINATE (DRAPES) ×1 IMPLANT
DRAPE SURG IRRIG POUCH 19X23 (DRAPES) ×1 IMPLANT
DRIVER NDL MEGA 8 DVNC XI (INSTRUMENTS) ×1 IMPLANT
DRIVER NDLE MEGA DVNC XI (INSTRUMENTS) ×1 IMPLANT
DRSG OPSITE POSTOP 4X6 (GAUZE/BANDAGES/DRESSINGS) IMPLANT
DRSG OPSITE POSTOP 4X8 (GAUZE/BANDAGES/DRESSINGS) IMPLANT
ELECT PENCIL ROCKER SW 15FT (MISCELLANEOUS) IMPLANT
ELECT REM PT RETURN 15FT ADLT (MISCELLANEOUS) ×1 IMPLANT
FORCEPS BPLR FENES DVNC XI (FORCEP) ×1 IMPLANT
FORCEPS PROGRASP DVNC XI (FORCEP) ×1 IMPLANT
GAUZE 4X4 16PLY ~~LOC~~+RFID DBL (SPONGE) ×2 IMPLANT
GLOVE BIO SURGEON STRL SZ 6 (GLOVE) ×4 IMPLANT
GLOVE BIO SURGEON STRL SZ 6.5 (GLOVE) ×1 IMPLANT
GOWN STRL REUS W/ TWL LRG LVL3 (GOWN DISPOSABLE) ×4 IMPLANT
GOWN STRL REUS W/TWL LRG LVL3 (GOWN DISPOSABLE) ×4
GRASPER SUT TROCAR 14GX15 (MISCELLANEOUS) IMPLANT
HEMOSTAT ARISTA ABSORB 3G PWDR (HEMOSTASIS) IMPLANT
HOLDER FOLEY CATH W/STRAP (MISCELLANEOUS) IMPLANT
IRRIG SUCT STRYKERFLOW 2 WTIP (MISCELLANEOUS) ×1
IRRIGATION SUCT STRKRFLW 2 WTP (MISCELLANEOUS) ×1 IMPLANT
KIT PROCEDURE DVNC SI (MISCELLANEOUS) IMPLANT
KIT TURNOVER KIT A (KITS) IMPLANT
LIGASURE IMPACT 36 18CM CVD LR (INSTRUMENTS) IMPLANT
MANIPULATOR ADVINCU DEL 3.0 PL (MISCELLANEOUS) IMPLANT
MANIPULATOR ADVINCU DEL 3.5 PL (MISCELLANEOUS) IMPLANT
MANIPULATOR UTERINE 4.5 ZUMI (MISCELLANEOUS) IMPLANT
NDL HYPO 21X1.5 SAFETY (NEEDLE) ×1 IMPLANT
NDL SPNL 18GX3.5 QUINCKE PK (NEEDLE) IMPLANT
NEEDLE HYPO 21X1.5 SAFETY (NEEDLE) ×1 IMPLANT
NEEDLE SPNL 18GX3.5 QUINCKE PK (NEEDLE) IMPLANT
OBTURATOR OPTICAL STND 8 DVNC (TROCAR) ×1
OBTURATOR OPTICALSTD 8 DVNC (TROCAR) ×1 IMPLANT
PACK ROBOT GYN CUSTOM WL (TRAY / TRAY PROCEDURE) ×1 IMPLANT
PAD POSITIONING PINK XL (MISCELLANEOUS) ×1 IMPLANT
PORT ACCESS TROCAR AIRSEAL 12 (TROCAR) IMPLANT
SCISSORS MNPLR CVD DVNC XI (INSTRUMENTS) ×1 IMPLANT
SCRUB CHG 4% DYNA-HEX 4OZ (MISCELLANEOUS) IMPLANT
SEAL UNIV 5-12 XI (MISCELLANEOUS) ×4 IMPLANT
SET TRI-LUMEN FLTR TB AIRSEAL (TUBING) ×1 IMPLANT
SPIKE FLUID TRANSFER (MISCELLANEOUS) ×1 IMPLANT
SPONGE T-LAP 18X18 ~~LOC~~+RFID (SPONGE) IMPLANT
SURGIFLO W/THROMBIN 8M KIT (HEMOSTASIS) IMPLANT
SUT MNCRL AB 4-0 PS2 18 (SUTURE) IMPLANT
SUT PDS AB 1 TP1 96 (SUTURE) IMPLANT
SUT V-LOC 180 0-0 GS22 (SUTURE) IMPLANT
SUT VIC AB 0 CT1 27 (SUTURE)
SUT VIC AB 0 CT1 27XBRD ANTBC (SUTURE) IMPLANT
SUT VIC AB 2-0 CT1 27 (SUTURE)
SUT VIC AB 2-0 CT1 TAPERPNT 27 (SUTURE) IMPLANT
SUT VIC AB 4-0 PS2 18 (SUTURE) ×2 IMPLANT
SUT VICRYL 0 27 CT2 27 ABS (SUTURE) ×1 IMPLANT
SUT VLOC 180 0 9IN  GS21 (SUTURE)
SUT VLOC 180 0 9IN GS21 (SUTURE) IMPLANT
SYR 10ML LL (SYRINGE) IMPLANT
SYS BAG RETRIEVAL 10MM (BASKET)
SYS WOUND ALEXIS 18CM MED (MISCELLANEOUS)
SYSTEM BAG RETRIEVAL 10MM (BASKET) IMPLANT
SYSTEM WOUND ALEXIS 18CM MED (MISCELLANEOUS) IMPLANT
TOWEL OR NON WOVEN STRL DISP B (DISPOSABLE) IMPLANT
TRAP SPECIMEN MUCUS 40CC (MISCELLANEOUS) IMPLANT
TRAY FOLEY MTR SLVR 16FR STAT (SET/KITS/TRAYS/PACK) ×1 IMPLANT
TROCAR PORT AIRSEAL 5X120 (TROCAR) IMPLANT
TROCAR XCEL NON-BLD 5MMX100MML (ENDOMECHANICALS) IMPLANT
UNDERPAD 30X36 HEAVY ABSORB (UNDERPADS AND DIAPERS) ×2 IMPLANT
WATER STERILE IRR 1000ML POUR (IV SOLUTION) ×1 IMPLANT
YANKAUER SUCT BULB TIP 10FT TU (MISCELLANEOUS) IMPLANT

## 2023-01-05 NOTE — H&P (Signed)
Gynecologic Oncology H&P  01/05/23  Treatment History: The patient was recently seen in the emergency department in early March with acute onset lower abdominal pain that started the night before accompanied by nausea and diaphoresis.  She reports developing sudden onset of pain that lasted for about an hour that caused her to be in fetal position.  She was able to make it to urgent care after the pain improved.  Was noted to be tender to palpation on exam in the lower abdomen.  She has had similar type episodes previously, where she has developed sudden onset of pain.  In 2022, was told that this was likely related to a cyst rupturing.   Work-up thus far: 10/2017: Pelvic ultrasound shows left ovary measures up to 5.3 cm with a dominant cyst measuring up to 3.5 cm with some internal debris.  Smaller adjacent hemorrhagic cyst with internal septations measures almost 2 cm. 10/2017: CT of the abdomen and pelvis shows mild left pelvic mesenteric stranding, possibly involving the left ovary/adnexa.  Bilateral ovarian cysts, measuring up to 3.5 cm on the left, likely physiologic.  Uterine fibroids. 09/17/2020: [facility] normal-[facility].  Right ovary measures up to 3.8 cm and left up to 4.9 centimeters.  Multiple intramural fibroids noted, largest measuring up to 3.5 cm.  Right ovary with a 2.2 cm simple appearing cyst.  Left ovary with a 5 cm simple appearing cyst without blood flow. 04/30/2021: Pelvic ultrasound shows Uterus measures 10.2 x 6.8 x 5.8 cm.  There multiple fibroids noted, the largest measuring up to 3.7 cm.  Left ovary measures 5.2 x 4.4 x 3.7 cm with a 3.2 cm minimally complex cyst with low-level internal echoes.  Right ovary not visualized. 04/30/2021: CT of the abdomen and pelvis reveals a complex appearing 5.6 cm left ovarian cyst with some pelvic free fluid, possibly representing a ruptured ovarian cyst.  Myomatous uterus. 10/30/2022: CT abdomen and pelvis  for acute lower abdominal pain shows an anteverted fibroid uterus.  There is a 5.4 x 5.1 x 4.5 cm intermediate attenuation lesion in the left adnexal region, difficult to discern whether this is arising from the left adnexa or uterus.  There are areas of enhancing mural nodularity.  The left gonadal vessels are well-visualized without twisting.  Differential includes primary adnexal cystic structure, degenerated exophytic fibroid. 3/11: [id number]   Interval History: Doing well, continues to have pelvic pain.  Past Medical/Surgical History: Past Medical History:  Diagnosis Date   Anemia    Endometriosis    cyst removed from ovary   Fibroid, uterine    Headache    History of Graves' disease    Hypothyroidism    Ovarian cyst    PCOS (polycystic ovarian syndrome)    PONV (postoperative nausea and vomiting)    NAUSEA   Scleratitis    Thyroid disease     Past Surgical History:  Procedure Laterality Date   BREAST BIOPSY Right    biopsy benign   DILATION AND EVACUATION N/A 11/22/2019   Procedure: DILATATION AND EVACUATION;  Surgeon: Zelphia Cairo, MD;  Location: [facility] ;  Service: Gynecology;  Laterality: N/A;   OVARIAN CYST REMOVAL     in [place], 2013-14, [facility]   THYROIDECTOMY  2006   TOTAL DONE FOR GRAVES DISEASE   TONSILLECTOMY  AS CHILD    Family History  Problem Relation Age of Onset   Hypertension Father    Prostate cancer Father    Breast  cancer Maternal Aunt    Stroke Maternal Grandfather    Diabetes Maternal Grandfather    Cancer Maternal Grandfather    Diabetes Paternal Grandfather    Colon cancer Neg Hx    Ovarian cancer Neg Hx    Endometrial cancer Neg Hx    Pancreatic cancer Neg Hx     Social History   Socioeconomic History   Marital status: Married    Spouse name: Not on file   Number of children: 0   Years of education: Not on file   Highest education level: Not on file  Occupational History   Occupation: insurance for [employer]  Tobacco Use   Smoking status: Never   Smokeless tobacco: Never  Vaping Use   Vaping Use: Never used  Substance and Sexual Activity   Alcohol use: No   Drug use: No   Sexual activity: Yes  Other Topics Concern   Not on file  Social History Narrative   Not on file   Social Determinants of Health   Financial Resource Strain: Low Risk  (11/05/2022)   Overall Financial Resource Strain (CARDIA)    Difficulty of Paying Living Expenses: Not hard at all  Food Insecurity: No Food Insecurity (11/08/2022)   Hunger Vital Sign    Worried About Running Out of Food in the Last Year: Never true    Ran Out of Food in the Last Year: Never true  Transportation Needs: No Transportation Needs (11/08/2022)   PRAPARE - Administrator, Civil Service (Medical): No    Lack of Transportation (Non-Medical): No  Physical Activity: Not on file  Stress: Not on file  Social Connections: Not on file    Current Medications:  Current Facility-Administered Medications:    bupivacaine (MARCAINE) 0.25 % (with pres) injection, , , PRN, Carver Fila, MD, 1 mL at 01/05/23 0803   dexamethasone (DECADRON) injection 4 mg, 4 mg, Intravenous, On Call to OR, Cross, Melissa D, NP   ketorolac (TORADOL) 15 MG/ML injection 15 mg, 15 mg, Intravenous, On Call to OR, Cross, Melissa D, NP   lactated ringers infusion, , Intravenous, Continuous, Kaylyn Layer, MD, Last Rate: 10 mL/hr at 01/05/23 0747, Continued from Pre-op at 01/05/23 0747   lactated ringers irrigation solution, , , PRN, Carver Fila, MD, 1,000 mL at 01/05/23 0801   scopolamine (TRANSDERM-SCOP) 1 MG/3DAYS 1.5 mg, 1 patch, Transdermal, On Call to OR, Cross, Melissa D, NP, 1.5 mg at 01/05/23 0713   sterile water for irrigation for irrigation, , , PRN, Carver Fila, MD, 1,000 mL at 01/05/23 0802  Physical Exam: BP (!) 147/93   Pulse 78   Temp 98.5 F (36.9 C)   Resp 16   Ht 5\' 5"  (1.651 m)   Wt 209 lb 14.1 oz (95.2 kg)    LMP 12/17/2022   SpO2 100%   BMI 34.93 kg/m  General: Alert, oriented, no acute distress.  HEENT: Normocephalic, atraumatic. Sclera anicteric.  Chest: Clear to auscultation bilaterally. No wheezes, rhonchi, or rales. Cardiovascular: Regular rate and rhythm, no murmurs, rubs, or gallops.  Abdomen: Obese. Normoactive bowel sounds. Soft, nondistended, nontender to palpation. No masses or hepatosplenomegaly appreciated. No palpable fluid wave.  Extremities: Grossly normal range of motion. Warm, well perfused. No edema bilaterally.  Laboratory & Radiologic Studies:    Latest Ref Rng & Units 12/31/2022   11:00 AM 11/01/2022   12:00 AM 04/30/2021    1:01 PM  CBC  WBC 4.0 - 10.5  K/uL 7.0  10.3     10.9   Hemoglobin 12.0 - 15.0 g/dL 16.1  09.6     04.5   Hematocrit 36.0 - 46.0 % 43.7  45     43.6   Platelets 150 - 400 K/uL 283  317     301      This result is from an external source.      Latest Ref Rng & Units 12/31/2022   11:00 AM 04/30/2021    1:01 PM 10/22/2017   10:59 AM  BMP  Glucose 70 - 99 mg/dL 93  91  88   BUN 6 - 20 mg/dL 11  11  7    Creatinine 0.44 - 1.00 mg/dL 4.09  8.11  9.14   Sodium 135 - 145 mmol/L 139  135  139   Potassium 3.5 - 5.1 mmol/L 4.2  4.2  3.7   Chloride 98 - 111 mmol/L 105  101  104   CO2 22 - 32 mmol/L 25  25  25    Calcium 8.9 - 10.3 mg/dL 9.0  8.8  8.8    Assessment & Plan: Meghan Leach is a 42 y.o. woman with enlarged uterine fibroid, left adnexal mass.   Discussed surgical options again with the patient. Given pain and fibroids, we discussed possibility of TH regardless of whether mass is uterine or ovarian in origin. The patient and her husband have made decisions about future fertility and the patient would like to move forward with surgery that is most likely to help with her symptoms of pain. Goal will be to leave one ovary in situ (at least) as long as benign frozen section and it looks normal intra-op.  Eugene Garnet, MD  Division of  Gynecologic Oncology  Department of Obstetrics and Gynecology  [facility]

## 2023-01-05 NOTE — Brief Op Note (Signed)
01/05/2023  11:44 AM  PATIENT:  Meghan Leach  42 y.o. female  PRE-OPERATIVE DIAGNOSIS:  adenxal mass, enlarged uterine fibroid, elevated tumor markers  POST-OPERATIVE DIAGNOSIS:  adenxal mass, enlarged uterine fibroid, elevated tumor markers, endometriosis  PROCEDURE:  Procedure(s): XI ROBOTIC ASSISTED TOTAL HYSTERECTOMY, UNILATERAL OOPHORECTOMY, BILATERAL SALPINGECTOMY, LYSIS OF ADHESIONS (N/A)  SURGEON:  Surgeon(s) and Role:    Carver Fila, MD - Primary  ASSISTANTS: Warner Mccreedy NP   ANESTHESIA:   general  EBL:  125 mL   BLOOD ADMINISTERED:none  DRAINS: none   LOCAL MEDICATIONS USED:  MARCAINE     SPECIMEN:  washings, uterus, cervix, bilateral tubes, left ovary  DISPOSITION OF SPECIMEN:  PATHOLOGY  COUNTS:  YES  TOURNIQUET:  * No tourniquets in log *  DICTATION: .Note written in EPIC  PLAN OF CARE: Admit for overnight observation  PATIENT DISPOSITION:  PACU - hemodynamically stable.   Delay start of Pharmacological VTE agent (>24hrs) due to surgical blood loss or risk of bleeding: not applicable

## 2023-01-05 NOTE — Anesthesia Procedure Notes (Signed)
Procedure Name: Intubation Date/Time: 01/05/2023 8:42 AM  Performed by: Ponciano Ort, CRNAPre-anesthesia Checklist: Patient identified, Emergency Drugs available, Suction available and Patient being monitored Patient Re-evaluated:Patient Re-evaluated prior to induction Oxygen Delivery Method: Circle system utilized Preoxygenation: Pre-oxygenation with 100% oxygen Induction Type: IV induction Ventilation: Mask ventilation without difficulty Laryngoscope Size: Mac and 3 Tube type: Oral Tube size: 7.5 mm Number of attempts: 1 Airway Equipment and Method: Stylet and Oral airway Placement Confirmation: ETT inserted through vocal cords under direct vision, positive ETCO2 and breath sounds checked- equal and bilateral Secured at: 22 cm Tube secured with: Tape Dental Injury: Teeth and Oropharynx as per pre-operative assessment

## 2023-01-05 NOTE — Discharge Instructions (Addendum)
AFTER SURGERY INSTRUCTIONS   Return to work: 4-6 weeks if applicable  Use ibuprofen or naproxen. Do not take together since they work similarly.   Activity: 1. Be up and out of the bed during the day.  Take a nap if needed.  You may walk up steps but be careful and use the hand rail.  Stair climbing will tire you more than you think, you may need to stop part way and rest.    2. No lifting or straining for 6 weeks over 10 pounds. No pushing, pulling, straining for 6 weeks.   3. No driving for around 1 week(s).  Do not drive if you are taking narcotic pain medicine and make sure that your reaction time has returned.    4. You can shower as soon as the next day after surgery. Shower daily.  Use your regular soap and water (not directly on the incision) and pat your incision(s) dry afterwards; don't rub.  No tub baths or submerging your body in water until cleared by your surgeon. If you have the soap that was given to you by pre-surgical testing that was used before surgery, you do not need to use it afterwards because this can irritate your incisions.    5. No sexual activity and nothing in the vagina for 4-6 weeks, 10-12 weeks if you have a hysterectomy (removal of the uterus and cervix).   6. You may experience a small amount of clear drainage from your incisions, which is normal.  If the drainage persists, increases, or changes color please call the office.   7. Do not use creams, lotions, or ointments such as neosporin on your incisions after surgery until advised by your surgeon because they can cause removal of the dermabond glue on your incisions.     8. You may experience vaginal spotting after surgery or around the 6-8 week mark from surgery when the stitches at the top of the vagina begin to dissolve (if you have a hysterectomy).  The spotting is normal but if you experience heavy bleeding, call our office.   9. Take Tylenol or ibuprofen first for pain if you are able to take these  medications and only use narcotic pain medication for severe pain not relieved by the Tylenol or Ibuprofen.  Monitor your Tylenol intake to a max of 4,000 mg in a 24 hour period. You can alternate these medications after surgery.   Diet: 1. Low sodium Heart Healthy Diet is recommended but you are cleared to resume your normal (before surgery) diet after your procedure.   2. It is safe to use a laxative, such as Miralax or Colace, if you have difficulty moving your bowels. You have been prescribed Sennakot-S to take at bedtime every evening after surgery to keep bowel movements regular and to prevent constipation.     Wound Care: 1. Keep clean and dry.  Shower daily.   Reasons to call the Doctor: Fever - Oral temperature greater than 100.4 degrees Fahrenheit Foul-smelling vaginal discharge Difficulty urinating Nausea and vomiting Increased pain at the site of the incision that is unrelieved with pain medicine. Difficulty breathing with or without chest pain New calf pain especially if only on one side Sudden, continuing increased vaginal bleeding with or without clots.   Contacts: For questions or concerns you should contact:   Dr. Eugene Garnet at [phone]   Warner Mccreedy, NP at [phone]   After Hours: call [phone] and have the GYN Oncologist paged/contacted (after 5 pm  or on the weekends). You will speak with an after hours RN and let he or she know you have had surgery.   Messages sent via mychart are for non-urgent matters and are not responded to after hours so for urgent needs, please call the after hours number.

## 2023-01-05 NOTE — Op Note (Signed)
OPERATIVE NOTE  Pre-operative Diagnosis: Adnexal mass, elevated CA-125, suspected endometriosis, pelvic pain, uterine fibroids  Post-operative Diagnosis: same, stage IV endometriosis  Operation: Robotic-assisted laparoscopic total hysterectomy with bilateral salpingectomy, left oophorectomy, lysis of adhesions/enterolysis for approximately 60 minutes  Surgeon: Eugene Garnet MD  Assistant Surgeon: Warner Mccreedy NP  Anesthesia: GET  Urine Output: 400 cc  Operative Findings: On EUA, moderately mobile uterus, fullness in the cul de sac in the midline and to the left. On intra-abdominal entry, powder burn lesions on the diaphragm, omentum, small and large bowel mesentery and epiploica as well as the large bowel itself, over much of the pelvic peritoneum including bilateral sidewalls and the anterior cul de sac, uterine serosa. Liver and stomach normal in appearance. No ascites. Complete obliteration of the cul de sac noted. Left ovary replaced by a 6 cm cystic mass with drainage of chocolate brown fluid upon unavoidable cyst rupture during cyst manipulation and lysis of adhesions. Left ovary adherent to the sigmoid epiploica and sigmoid mesentery, lateral and posterior uterus and left deep pelvic sidewall. Dilated and clubbed fallopian tube on the left. Right ovary normal in appearance although adherent to the rectal mesentery on the right, posterior uterus. Normal appearing right fallopian tube. Uterus enlarged, 10-12 cm with multiple fibroids. Complete loss of plane posteriorly between the uterus/cervix and the rectum.  On cystoscopy, bladder dome intact, good efflux noted from bilateral ureteral orifices.  Estimated Blood Loss:  125 cc      Total IV Fluids: see I&O flowsheet         Specimens: uterus, cervix, bilateral tubes, left ovary, pelvic washings         Complications:  None apparent; patient tolerated the procedure well.         Disposition: PACU - hemodynamically  stable.  Procedure Details  The patient was seen in the Holding Room. The risks, benefits, complications, treatment options, and expected outcomes were discussed with the patient.  The patient concurred with the proposed plan, giving informed consent.  The site of surgery properly noted/marked. The patient was identified as Meghan Leach and the procedure verified as a Robotic-assisted hysterectomy with bilateral salpingectomy, possible unilateral oophorectomy, possible bilateral oophorectomy.   After induction of anesthesia, the patient was draped and prepped in the usual sterile manner. Patient was placed in supine position after anesthesia and draped and prepped in the usual sterile manner as follows: Her arms were tucked to her side with all appropriate precautions.  The patient was secured to the bed using padding and tape across her chest.  The patient was placed in the semi-lithotomy position in [place] stirrups.  The perineum and vagina were prepped with ChloraPrep. The patient's abdomen was prepped with ChloraPrep and then she was draped after the prep had been allowed to dry for 3 minutes.  A Time Out was held and the above information confirmed.  The urethra was prepped with Betadine. Foley catheter was placed.  A sterile speculum was placed in the vagina.  The cervix was grasped with a single-tooth tenaculum. The cervix was dilated with Shawnie Pons dilators.  The ZUMI uterine manipulator with a medium colpotomizer ring was placed without difficulty.  A pneum occluder balloon was placed over the manipulator.  OG tube placement was confirmed and to suction.   Next, a 5 mm skin incision was made 1 cm below the subcostal margin in the midclavicular line.  The 5 mm Optiview port and scope was used for direct entry with difficulty obtaining intra-abdominal entry  due to what appeared to be an adhesion. This trocar was left in place. A second 5 mm skin incision was made along the same line 4 cm inferiorly. The  5 mm Optiview port and scope was used for direct entry. Opening pressure was under 10 mm CO2.  The abdomen was insufflated and the findings were noted as above. Inspection of previously attempted trocar placement revealed trocar within the falciform ligament. The trocar was removed.   At this point and all points during the procedure, the patient's intra-abdominal pressure did not exceed 15 mmHg. Next, an 8 mm skin incision was made superior to the umbilicus and a right and left port were placed about 8 cm lateral to the robot port on the right and left side.  A fourth arm was placed on the right.  The 5 mm assist trocar was exchanged for a 10-12 mm port. All ports were placed under direct visualization.  The patient was placed in steep Trendelenburg.  The robot was docked in the normal manner.  Attention was first turned to lysis of adhesions.  With the uterus anteverted, close meticulous dissection was used to lyse adhesions between the left ovary and the sigmoid mesentery, sigmoid colon, left pelvic sidewall, and posterior uterus.  This was done with a combination of sharp dissection, blunt dissection, and short bursts of monopolar electrocautery.  Electrocautery was not used during dissection of the sigmoid itself from the ovary.  During manipulation of the ovary during this dissection, there was unavoidable rupture of the cyst with drainage of chocolate brown fluid consistent with an endometrioma.  The left peritoneum were opened parallel to the IP ligament to open the retroperitoneal space. The round ligament was transected. The ureter was noted to be on the medial leaf of the broad ligament.  The peritoneum above the ureter was incised and stretched and the infundibulopelvic ligament was skeletonized, cauterized and cut.    On the right, the fallopian tube was elevated and monopolar and bipolar electrocautery were used to cauterize the mesosalpinx, ultimately allowing for sharp dissection to excise the  fallopian tube. The proximal tallopian tube was cauterized and transected and the fallopian tube was handed off the field. The adhesions of the ovary to the rectal mesentery and posterior uterus were lysed bluntly and sharply until the ovary was freed. The right peritoneum were opened parallel to the IP ligament to open the retroperitoneal space. The round ligament was transected. The ureter was noted to be on the medial leaf of the broad ligament.  The peritoneum above the ureter was incised and the utero-ovarian ligament was cauterized and transected.   Attention was then turned posteriorly.  There was complete obliteration of the plane between the rectum and the uterus/cervix.  As the patient had not been bowel prepped and in an effort to avoid rectal injury, an EEA sizer was placed in the rectum.  Downward pressure was placed while upward traction was held on the uterus.  Sharp and blunt dissection was slowly used to develop a plane between the colon and uterus, favoring leaving minimal serosal and myometrial tissue on the colon rather than injure the colon itself.  This dissection was continued until ultimately the colon was noted to be below the level of the KOH ring and cervix.    The anterior peritoneum was also taken down.  The bladder flap was created to the level of the KOH ring.  The uterine artery on the right side was skeletonized, cauterized and cut in  the normal manner.  A similar procedure was performed on the left.  The colpotomy was made and the uterus, cervix, bilateral ovaries and tubes were amputated, placed in an Endo Catch bag, and delivered through the vagina.  Pedicles were inspected and excellent hemostasis was achieved.    The colpotomy at the vaginal cuff was closed with 0 Vicryl with a figure of eight at each apex and 0 V-Loc to close the midportion of the cuff in a running manner.  Irrigation was used and excellent hemostasis was achieved.  Sutures were handed out of one of the  robotic trocars.  Cystoscopy was performed with findings noted above. Foley catheter was replaced.  Rectal exam and rectovaginal exam were performed to assure vaginal cuff intact, no rectal injury.  The pelvis was irrigated again. Given raw peritoneal surfaces, Floseal and arista was placed along the pelvic surgical bed.   At this point in the procedure was completed.  Robotic instruments were removed under direct visulaization.  The robot was undocked.   The subcuticular tissue was closed with 4-0 Vicryl and the skin was closed with 4-0 Monocryl in a subcuticular manner.  Dermabond was applied.    The vagina was swabbed with minimal bleeding noted. All sponge, lap and needle counts were correct x  3.   The patient was transferred to the recovery room in stable condition.  Eugene Garnet, MD

## 2023-01-05 NOTE — Anesthesia Postprocedure Evaluation (Signed)
Anesthesia Post Note  Patient: Margaretha Canlas  Procedure(s) Performed: XI ROBOTIC ASSISTED TOTAL HYSTERECTOMY, UNILATERAL OOPHORECTOMY, BILATERAL SALPINGECTOMY, LYSIS OF ADHESIONS     Patient location during evaluation: PACU Anesthesia Type: General Level of consciousness: awake and alert Pain management: pain level controlled Vital Signs Assessment: post-procedure vital signs reviewed and stable Respiratory status: spontaneous breathing, nonlabored ventilation, respiratory function stable and patient connected to nasal cannula oxygen Cardiovascular status: blood pressure returned to baseline and stable Postop Assessment: no apparent nausea or vomiting Anesthetic complications: no  No notable events documented.  Last Vitals:  Vitals:   01/05/23 1303 01/05/23 1358  BP: (!) 150/87 (!) 143/77  Pulse: 66 66  Resp: 16 16  Temp: 36.6 C 36.4 C  SpO2: 100% 99%    Last Pain:  Vitals:   01/05/23 1400  TempSrc:   PainSc: Asleep                 Trevor Iha

## 2023-01-05 NOTE — Transfer of Care (Signed)
Immediate Anesthesia Transfer of Care Note  Patient: Meghan Leach  Procedure(s) Performed: XI ROBOTIC ASSISTED TOTAL HYSTERECTOMY, UNILATERAL OOPHORECTOMY, BILATERAL SALPINGECTOMY, LYSIS OF ADHESIONS  Patient Location: PACU  Anesthesia Type:General  Level of Consciousness: awake, alert , oriented, and patient cooperative  Airway & Oxygen Therapy: Patient Spontanous Breathing and Patient connected to face mask oxygen  Post-op Assessment: Report given to RN and Post -op Vital signs reviewed and stable  Post vital signs: Reviewed and stable  Last Vitals:  Vitals Value Taken Time  BP 134/74 01/05/23 1146  Temp 36.6 C 01/05/23 1145  Pulse 83 01/05/23 1151  Resp 19 01/05/23 1151  SpO2 95 % 01/05/23 1151  Vitals shown include unvalidated device data.  Last Pain:  Vitals:   01/05/23 0724  PainSc: 0-No pain      Patients Stated Pain Goal: 3 (01/05/23 0724)  Complications: No notable events documented.

## 2023-01-06 ENCOUNTER — Encounter (HOSPITAL_COMMUNITY): Payer: Self-pay | Admitting: Gynecologic Oncology

## 2023-01-06 DIAGNOSIS — E039 Hypothyroidism, unspecified: Secondary | ICD-10-CM | POA: Diagnosis not present

## 2023-01-06 DIAGNOSIS — D259 Leiomyoma of uterus, unspecified: Secondary | ICD-10-CM | POA: Diagnosis not present

## 2023-01-06 DIAGNOSIS — N8003 Adenomyosis of the uterus: Secondary | ICD-10-CM | POA: Diagnosis not present

## 2023-01-06 DIAGNOSIS — D271 Benign neoplasm of left ovary: Secondary | ICD-10-CM | POA: Diagnosis not present

## 2023-01-06 DIAGNOSIS — N8312 Corpus luteum cyst of left ovary: Secondary | ICD-10-CM | POA: Diagnosis not present

## 2023-01-06 DIAGNOSIS — R971 Elevated cancer antigen 125 [CA 125]: Secondary | ICD-10-CM | POA: Diagnosis not present

## 2023-01-06 LAB — BASIC METABOLIC PANEL
Anion gap: 5 (ref 5–15)
BUN: 7 mg/dL (ref 6–20)
CO2: 25 mmol/L (ref 22–32)
Calcium: 7.8 mg/dL — ABNORMAL LOW (ref 8.9–10.3)
Chloride: 102 mmol/L (ref 98–111)
Creatinine, Ser: 0.56 mg/dL (ref 0.44–1.00)
GFR, Estimated: >60 mL/min (ref >60)
Glucose, Bld: 113 mg/dL — ABNORMAL HIGH (ref 70–99)
Potassium: 4 mmol/L (ref 3.5–5.1)
Sodium: 132 mmol/L — ABNORMAL LOW (ref 135–145)

## 2023-01-06 LAB — CBC
HCT: 36.7 % (ref 36.0–46.0)
Hemoglobin: 12.1 g/dL (ref 12.0–15.0)
MCH: 26.4 pg (ref 26.0–34.0)
MCHC: 33 g/dL (ref 30.0–36.0)
MCV: 80.1 fL (ref 80.0–100.0)
Platelets: 226 10*3/uL (ref 150–400)
RBC: 4.58 MIL/uL (ref 3.87–5.11)
RDW: 13.5 % (ref 11.5–15.5)
WBC: 10.9 10*3/uL — ABNORMAL HIGH (ref 4.0–10.5)
nRBC: 0 % (ref 0.0–0.2)

## 2023-01-06 MED ORDER — IBUPROFEN 400 MG PO TABS
800.0000 mg | ORAL_TABLET | Freq: Three times a day (TID) | ORAL | Status: DC
  Administered 2023-01-06: 800 mg via ORAL
  Filled 2023-01-06: qty 2

## 2023-01-06 MED ORDER — HYDROMORPHONE HCL 1 MG/ML IJ SOLN: 0.5000 mg | INTRAMUSCULAR | Status: DC | PRN

## 2023-01-06 NOTE — Progress Notes (Signed)
  Transition of Care [facility]) Screening Note   Patient Details  Name: Meghan Leach Date of Birth: December 01, 1980   Transition of Care [facility]) CM/SW Contact:    Adrian Prows, RN Phone Number: 01/06/2023, 8:51 AM    Transition of Care Department [facility]) has reviewed patient and no TOC needs have been identified at this time. We will continue to monitor patient advancement through interdisciplinary progression rounds. If new patient transition needs arise, please place a TOC consult.

## 2023-01-06 NOTE — Progress Notes (Signed)
GYN Oncology Progress Note  1 Day Post-Op Procedure(s) (LRB): XI ROBOTIC ASSISTED TOTAL HYSTERECTOMY, UNILATERAL OOPHORECTOMY, BILATERAL SALPINGECTOMY, LYSIS OF ADHESIONS (N/A)  Subjective: Patient reports having moderate abdominal pain. She states she ambulated yesterday and tolerated this well. Decreased appetite reported and stating she has been nibbling on food. Having sore throat. No flatus. Intermittent belching. Mother at bedside. No concerns voiced.  Objective: Vital signs in last 24 hours: Temp:  [98.2 F (36.8 C)-98.6 F (37 C)] 98.5 F (36.9 C) (05/16 0848) Pulse Rate:  [63-93] 93 (05/16 0848) Resp:  [14-18] 18 (05/16 0531) BP: (108-142)/(61-82) 119/78 (05/16 0848) SpO2:  [95 %-100 %] 98 % (05/16 0848) Last BM Date : 01/03/23  Intake/Output from previous day: 05/15 0701 - 05/16 0700 In: 3864.9 [P.O.:1200; I.V.:2066.3; IV Piggyback:598.5] Out: 4425 [Urine:4300; Blood:125]  Physical Examination: General: alert, cooperative, and no distress Resp: clear to auscultation bilaterally Cardio: regular rate and rhythm, S1, S2 normal, no murmur, click, rub or gallop GI: incision: lap sites to the abdomen with dermabond intact with no active drainage and abdomen soft, mildly hypoactive bowel sounds in upper quads, more active in lower abdomen, appropriately tender. Extremities: extremities normal, atraumatic, no cyanosis or edema  Labs: WBC/Hgb/Hct/Plts:  10.9/12.1/36.7/226 (05/16 0443) BUN/Cr/glu/ALT/AST/amyl/lip:  7/0.56/--/--/--/--/-- (05/16 0443)  Assessment: 42 y.o. s/p Procedure(s): XI ROBOTIC ASSISTED TOTAL HYSTERECTOMY, UNILATERAL OOPHORECTOMY, BILATERAL SALPINGECTOMY, LYSIS OF ADHESIONS: stable Pain:  Pain  is slightly well-controlled on PRN medications. Plan to add in ibuprofen.  Heme: Hgb 12.1 and Hct 36.7 this am. Appropriate given preop values and surgical losses.  ID: WBC 10.9 this am. Given decadron intra-op along with IV abxs. No sign of infection  CV: BP  and HR stable. Continue to monitor while inpatient until discharge.  GI:  Tolerating po: yes, small amounts. Antiemetics ordered as needed.  GU: Adequate urine output. Creatinine 0.56 this am. Plan for foley removal.    FEN: No critical values on am labs  Prophylaxis: SCDS and lovenox.  Plan: Discontinue foley Saline lock IV Add ibuprofen If meeting milestones later today (eating, voiding, pain controlled on oral meds), pt can be discharged home   LOS: 0 days    Doylene Bode 01/06/2023, 3:34 PM

## 2023-01-06 NOTE — Progress Notes (Signed)
Reviewed written d/c instructions w pt and all questions answered. She verbalized understanding. D/c via w/c w all belongings in stable condition. 

## 2023-01-06 NOTE — Discharge Summary (Signed)
Physician Discharge Summary  Patient ID: Meghan Leach MRN: [id number] DOB/AGE: 1981-07-25 42 y.o.  Admit date: 01/05/2023 Discharge date: 01/06/2023  Admission Diagnoses: Adnexal mass  Discharge Diagnoses:  Principal Problem:   Adnexal mass Active Problems:   Elevated tumor markers   Discharged Condition:  The patient is in good condition and stable for discharge.    Hospital Course: On 01/05/2023, the patient underwent the following: Procedure(s): XI ROBOTIC ASSISTED TOTAL HYSTERECTOMY, UNILATERAL OOPHORECTOMY, BILATERAL SALPINGECTOMY, LYSIS OF ADHESIONS. The postoperative course was uneventful.  She was discharged to home on postoperative day 1 tolerating a regular diet, voiding, pain controlled with oral meds.   Consults: None  Significant Diagnostic Studies: AM labs  Treatments: Surgery: see above  Discharge Exam (am assessment by Dr. Pricilla Holm): Blood pressure 119/78, pulse 93, temperature 98.5 F (36.9 C), temperature source Oral, resp. rate 18, height 5\' 5"  (1.651 m), weight 209 lb 14.1 oz (95.2 kg), last menstrual period 12/17/2022, SpO2 98 %, unknown if currently breastfeeding. General: alert, cooperative, and no distress Resp: clear to auscultation bilaterally Cardio: regular rate and rhythm, S1, S2 normal, no murmur, click, rub or gallop GI: incision: lap sites to the abdomen with dermabond intact with no active drainage and abdomen soft, mildly hypoactive bowel sounds in upper quads, more active in lower abdomen, appropriately tender. Extremities: extremities normal, atraumatic, no cyanosis or edema  Disposition: Discharge disposition: 01-Home or Self Care       Discharge Instructions     Call MD for:  difficulty breathing, headache or visual disturbances   Complete by: As directed    Call MD for:  extreme fatigue   Complete by: As directed    Call MD for:  hives   Complete by: As directed    Call MD for:  persistant dizziness or light-headedness    Complete by: As directed    Call MD for:  persistant nausea and vomiting   Complete by: As directed    Call MD for:  redness, tenderness, or signs of infection (pain, swelling, redness, odor or green/yellow discharge around incision site)   Complete by: As directed    Call MD for:  severe uncontrolled pain   Complete by: As directed    Call MD for:  temperature >100.4   Complete by: As directed    Diet - low sodium heart healthy   Complete by: As directed    Driving Restrictions   Complete by: As directed    No driving for 1 week(s).  Do not take narcotics and drive. You need to make sure your reaction time has returned.   Increase activity slowly   Complete by: As directed    Lifting restrictions   Complete by: As directed    No lifting greater than 10 lbs, pushing, pulling, straining for 6 weeks.   Sexual Activity Restrictions   Complete by: As directed    No sexual activity, nothing in the vagina, for 10-12 weeks.      Allergies as of 01/06/2023       Reactions   Lactose Intolerance (gi)    Metformin Other (See Comments)   bloated   Metformin And Related    sick        Medication List     STOP taking these medications    phenazopyridine 200 MG tablet Commonly known as: PYRIDIUM       TAKE these medications    ibuprofen 800 MG tablet Commonly known as: ADVIL Take 1 tablet (800 mg  total) by mouth every 8 (eight) hours as needed for moderate pain. For AFTER surgery only   naproxen 500 MG tablet Commonly known as: NAPROSYN Take 500 mg by mouth 2 (two) times daily as needed for mild pain or moderate pain.   oxyCODONE 5 MG immediate release tablet Commonly known as: Oxy IR/ROXICODONE Take 1 tablet (5 mg total) by mouth every 4 (four) hours as needed for severe pain. For AFTER surgery only, do not take and drive   senna-docusate 1.6-10 MG tablet Commonly known as: Senokot-S Take 2 tablets by mouth at bedtime. For AFTER surgery, do not take if having  diarrhea What changed:  when to take this reasons to take this   thyroid 60 MG tablet Commonly known as: ARMOUR Take 60 mg by mouth daily. afternoon   NP Thyroid 90 MG tablet Generic drug: thyroid Take 90 mg by mouth daily. morning        Follow-up Information     Carver Fila, MD Follow up on 01/28/2023.   Specialty: Gynecologic Oncology Why: at 3:45pm at the [facility] information: [address] [place] [address] [phone]                 Greater than thirty minutes were spend for face to face discharge instructions and discharge orders/summary in EPIC.   Signed: Doylene Bode 01/06/2023, 3:44 PM

## 2023-01-06 NOTE — Plan of Care (Signed)

## 2023-01-07 ENCOUNTER — Telehealth: Payer: Self-pay | Admitting: *Deleted

## 2023-01-07 LAB — CYTOLOGY - NON PAP

## 2023-01-07 NOTE — Telephone Encounter (Signed)
Spoke with Ms. Durango this morning. She states she is eating, drinking and urinating well. She has had a  BM and is passing gas. She is taking senokot as prescribed and encouraged her to drink plenty of water. She denies fever or chills. Incisions are dry and intact. She rates her pain 9/10. Her pain is controlled with ibuprofen and oxycodone, her pain goes down to a 7/10.   Instructed to call office with any fever, chills, purulent drainage, uncontrolled pain or any other questions or concerns. Patient verbalizes understanding.   Pt aware of post op appointments as well as the office number [phone] and after hours number [phone] to call if she has any questions or concerns

## 2023-01-10 LAB — SURGICAL PATHOLOGY

## 2023-01-11 ENCOUNTER — Telehealth: Payer: Self-pay | Admitting: *Deleted

## 2023-01-11 NOTE — Telephone Encounter (Signed)
Meghan Leach called the office to make sure we received her papers from [facility]. Advised patient that we did receive them. Pt also was inquiring about her pathology report that she read on MyChart and wanted confirmation that it was negative for malignancy. Pt advised that a provider would be reaching out to her or the office would call her back. No further concerns or questions at this time.

## 2023-01-12 ENCOUNTER — Telehealth: Payer: Federal, State, Local not specified - PPO | Admitting: Gynecologic Oncology

## 2023-01-12 IMAGING — CR DG CHEST 2V
2 series · 2 of 2 positions shown · non-contrast
Comparison: 09/29/2016

CLINICAL DATA: Cough for 1 week

EXAM:
CHEST - 2 VIEW

[chest pa]
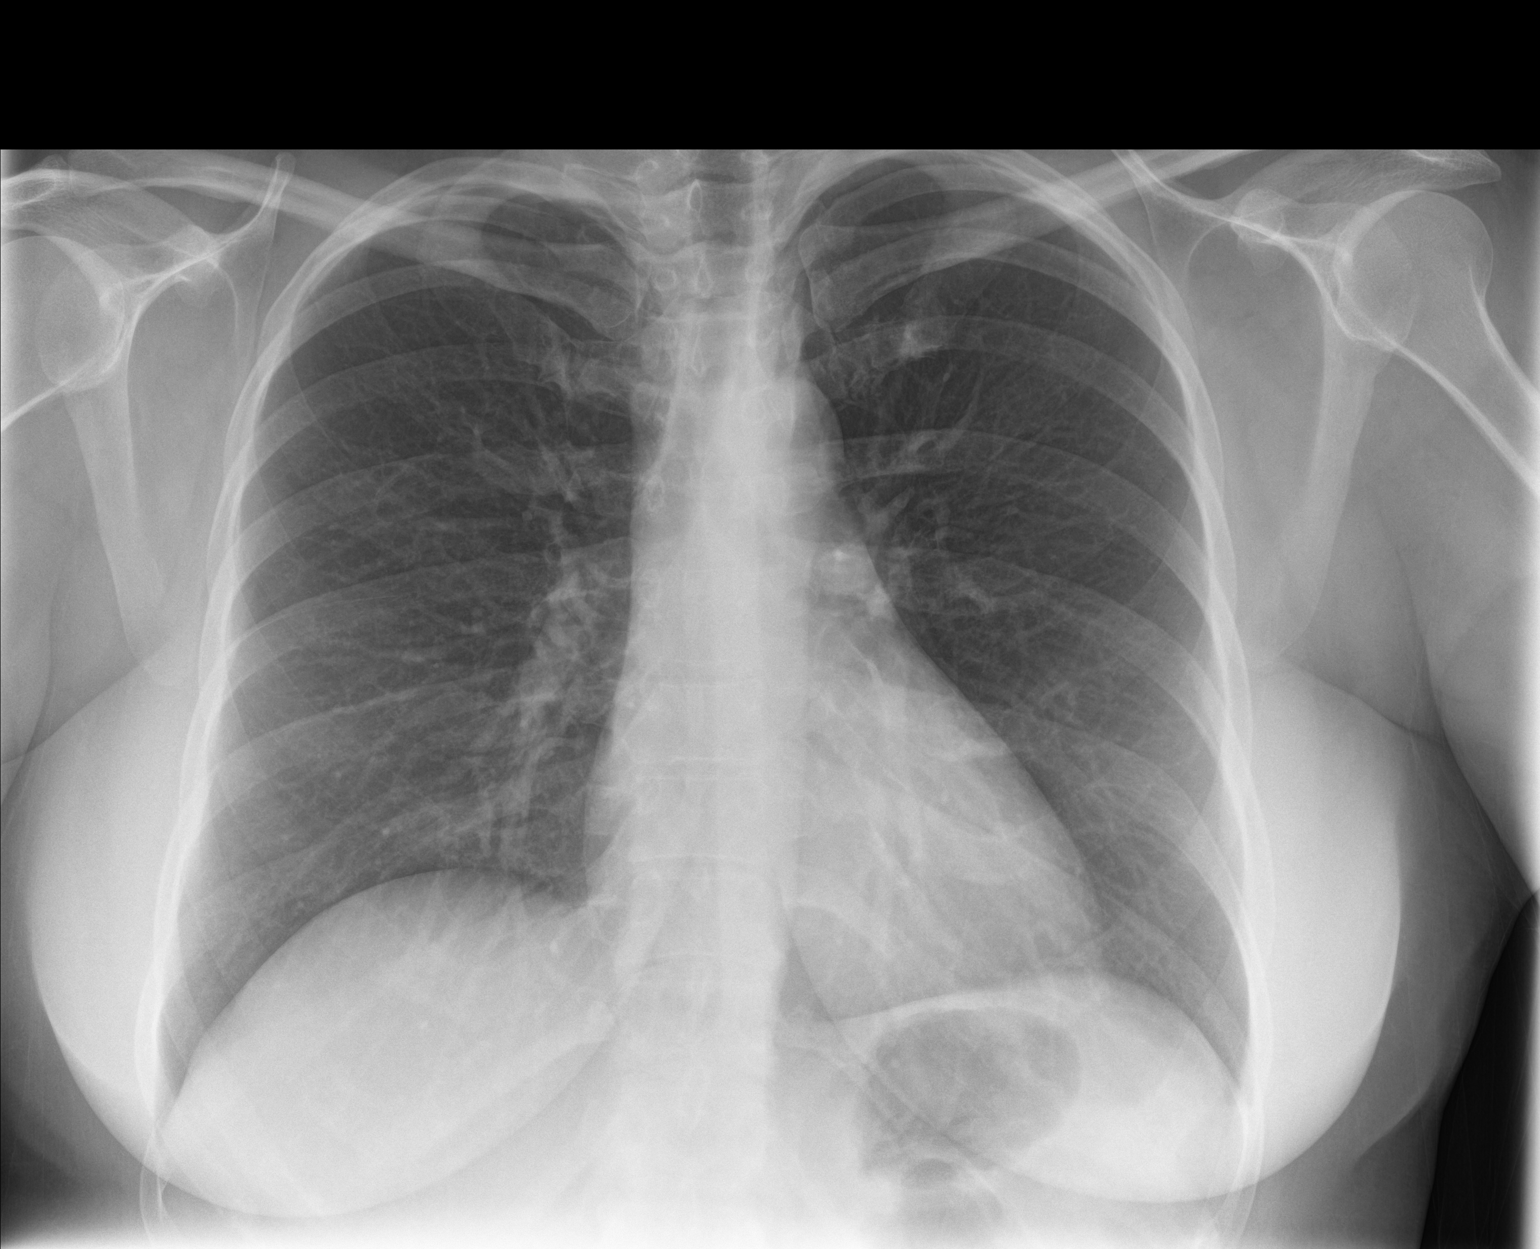

[chest lat]
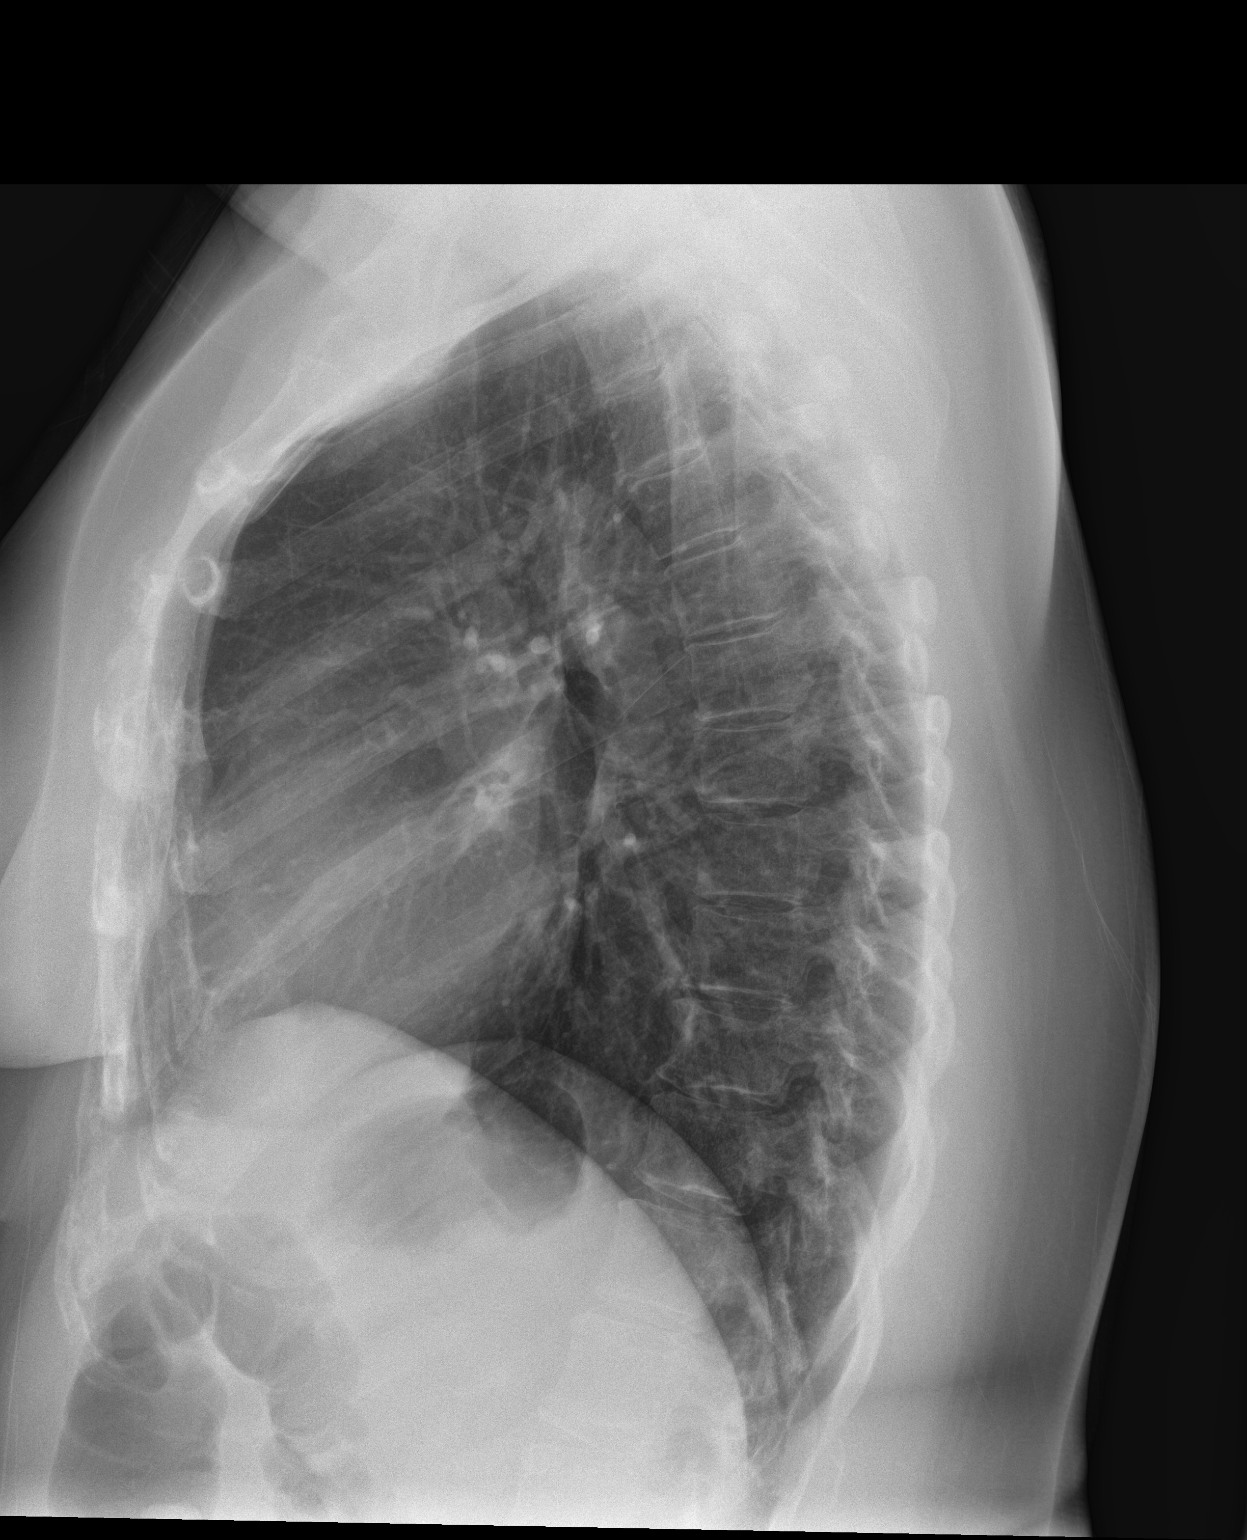

[2 of 2 positions shown; findings below may reference images not displayed]

FINDINGS: The heart size and mediastinal contours are within normal limits. No
focal airspace consolidation, pleural effusion, or pneumothorax. The
visualized skeletal structures are unremarkable.
IMPRESSION: No active cardiopulmonary disease.

## 2023-01-12 NOTE — Telephone Encounter (Signed)
Meghan Leach called the office back and results relayed from Meghan Mccreedy, NP that her pathology report was negative for precancer & cancer. Good News!  Patient thanked the office and no further concerns or questions at this time.

## 2023-01-12 NOTE — Telephone Encounter (Signed)
Tried to reach Meghan Leach this morning, left a voicemail for patient to call the office back at [phone].

## 2023-01-13 ENCOUNTER — Telehealth: Payer: Self-pay | Admitting: Surgery

## 2023-01-13 NOTE — Telephone Encounter (Signed)
Pt called in stating her incisions are extremely itchy 24/7 and was wanting to make sure that's ok. Denies redness, swelling, warmth, or drainage to incision sites, stating they look normal. She has been taking Allegra during the day and Benadryl at night, as well as using ice topically to help with itching. Advised patient that this is most likely part of the healing process and reminded her to not use anything topically as this may dissolve the glue. Patient denies using anything topically. Advised patient to call us if she starts to have redness, warmth, swelling, or drainage or if she has fever or chills. Also advised patient that Dr Pricilla Holm would be notified and if there are any additional recommendations our office will call her back. Patient verbalized understanding and had no other concerns at this time.

## 2023-01-13 NOTE — Telephone Encounter (Signed)
Just clarifying - she can use benadryl. I recommended otc steroid cream if having a lot of itching and benadryl does not help sufficiently

## 2023-01-13 NOTE — Telephone Encounter (Signed)
Called patient back and let her know that Dr Pricilla Holm does not recommend Benadryl, but that she can get an OTC low dose steroid cream to help with the itching. Advised patient that if she is still itching after the weekend to call our office and we can give her some adhesive remover pads. Patient verbalized understanding and had no other concerns at this time.

## 2023-01-14 ENCOUNTER — Other Ambulatory Visit: Payer: Self-pay | Admitting: Gynecologic Oncology

## 2023-01-14 ENCOUNTER — Encounter: Payer: Self-pay | Admitting: Gynecologic Oncology

## 2023-01-14 DIAGNOSIS — L270 Generalized skin eruption due to drugs and medicaments taken internally: Secondary | ICD-10-CM

## 2023-01-14 MED ORDER — PREDNISONE 5 MG (21) PO TBPK
ORAL_TABLET | ORAL | 0 refills | Status: DC
Start: 2023-01-14 — End: 2023-01-27

## 2023-01-14 MED ORDER — HYDROCORTISONE 2.5 % EX CREA
TOPICAL_CREAM | Freq: Two times a day (BID) | CUTANEOUS | 1 refills | Status: DC
Start: 2023-01-14 — End: 2023-11-28

## 2023-01-14 NOTE — Telephone Encounter (Signed)
Told Meghan Leach that [facility] sent in a medrol dose pack and a 2.5% hydrocortisone cram to apply to  incisions. Continue to use oral benadryl 50 mg prn. Meghan Leach said to apply Vaseline to glue on incisions and let it sit and soften the glue and gently peel it off the skin. Pt verbalized understanding.

## 2023-01-19 ENCOUNTER — Telehealth: Payer: Self-pay | Admitting: *Deleted

## 2023-01-19 NOTE — Telephone Encounter (Signed)
Meghan Leach was notified and states that her incisions are looking much better and the redness is considerably less. Patient states the incisions are still a little itchy and she continues to take one benadryl at night before bed, she is still taking the prednisone and has two more days left and is still using the hydrocortisone cream.   Patient states she is having some slight cramping and spotting, "just a little bit of pink discharge". Pt was reassured this is normal and advised patient to call the office with any increased bleeding, discharge or signs/symptoms of infection. Pt has no further concerns or questions at this time. Reminded Meghan Leach of her follow up appt. With Dr. Pricilla Holm next week, Friday June 7th at  3:45.

## 2023-01-19 NOTE — Telephone Encounter (Signed)
Per patient request emailed the short term disability to the patient

## 2023-01-19 NOTE — Telephone Encounter (Signed)
Per patient request mailed short term disability forms to the patient 's home

## 2023-01-20 ENCOUNTER — Other Ambulatory Visit: Payer: Self-pay | Admitting: Gynecologic Oncology

## 2023-01-20 DIAGNOSIS — R978 Other abnormal tumor markers: Secondary | ICD-10-CM

## 2023-01-20 DIAGNOSIS — N9489 Other specified conditions associated with female genital organs and menstrual cycle: Secondary | ICD-10-CM

## 2023-01-27 NOTE — Progress Notes (Signed)
Gynecologic Oncology Return Clinic Visit  01/28/23  Reason for Visit: follow-up after surgery  Treatment History: The patient was recently seen in the emergency department in early March with acute onset lower abdominal pain that started the night before accompanied by nausea and diaphoresis.  She reports developing sudden onset of pain that lasted for about an hour that caused her to be in fetal position.  She was able to make it to urgent care after the pain improved.  Was noted to be tender to palpation on exam in the lower abdomen.  She has had similar type episodes previously, where she has developed sudden onset of pain.  In 2022, was told that this was likely related to a cyst rupturing.   Work-up thus far: 10/2017: Pelvic ultrasound shows left ovary measures up to 5.3 cm with a dominant cyst measuring up to 3.5 cm with some internal debris.  Smaller adjacent hemorrhagic cyst with internal septations measures almost 2 cm. 10/2017: CT of the abdomen and pelvis shows mild left pelvic mesenteric stranding, possibly involving the left ovary/adnexa.  Bilateral ovarian cysts, measuring up to 3.5 cm on the left, likely physiologic.  Uterine fibroids. 09/17/2020: [facility] normal-sized [facility].  Right ovary measures up to 3.8 cm and left up to 4.9 centimeters.  Multiple intramural fibroids noted, largest measuring up to 3.5 cm.  Right ovary with a 2.2 cm simple appearing cyst.  Left ovary with a 5 cm simple appearing cyst without blood flow. 04/30/2021: Pelvic ultrasound shows Uterus measures 10.2 x 6.8 x 5.8 cm.  There multiple fibroids noted, the largest measuring up to 3.7 cm.  Left ovary measures 5.2 x 4.4 x 3.7 cm with a 3.2 cm minimally complex cyst with low-level internal echoes.  Right ovary not visualized. 04/30/2021: CT of the abdomen and pelvis reveals a complex appearing 5.6 cm left ovarian cyst with some pelvic free fluid, possibly representing a ruptured  ovarian cyst.  Myomatous uterus. 10/30/2022: CT abdomen and pelvis for acute lower abdominal pain shows an anteverted fibroid uterus.  There is a 5.4 x 5.1 x 4.5 cm intermediate attenuation lesion in the left adnexal region, difficult to discern whether this is arising from the left adnexa or uterus.  There are areas of enhancing mural nodularity.  The left gonadal vessels are well-visualized without twisting.  Differential includes primary adnexal cystic structure, degenerated exophytic fibroid. 3/11: CA 125-374  01/05/23:  Robotic-assisted laparoscopic total hysterectomy with bilateral salpingectomy, left oophorectomy, lysis of adhesions/enterolysis for approximately 60 minutes   Interval History: Doing well.  Having some back pain which she thinks is related to sitting more than normal.  Endorses some urinary frequency, urinating every 4 hours as well as some incontinence, which she had prior to surgery.  Denies any dysuria.  Has been having some light watery discharge/bleeding for the last week, changes her panty liner about 6 times with small amount of clear/yellow fluid on it.  Feels like there is "something in my vagina".  Patient endorses regular daily bowel function.  Incisions are still itching a little, but significantly improved.  She is still using the steroid cream.  Tolerating p.o. without nausea or emesis.  Past Medical/Surgical History: Past Medical History:  Diagnosis Date   Anemia    Endometriosis    cyst removed from ovary   Fibroid, uterine    Headache    History of Graves' disease    Hypothyroidism    Ovarian cyst  PCOS (polycystic ovarian syndrome)    PONV (postoperative nausea and vomiting)    NAUSEA   Scleratitis    Thyroid disease     Past Surgical History:  Procedure Laterality Date   BREAST BIOPSY Right    biopsy benign   DILATION AND EVACUATION N/A 11/22/2019   Procedure: DILATATION AND EVACUATION;  Surgeon: Zelphia Cairo, MD;  Location: [facility];  Service: Gynecology;  Laterality: N/A;   OVARIAN CYST REMOVAL     in [place], 2013-14, lsc   ROBOTIC ASSISTED TOTAL HYSTERECTOMY WITH BILATERAL SALPINGO OOPHERECTOMY N/A 01/05/2023   Procedure: XI ROBOTIC ASSISTED TOTAL HYSTERECTOMY, UNILATERAL OOPHORECTOMY, BILATERAL SALPINGECTOMY, LYSIS OF ADHESIONS;  Surgeon: Carver Fila, MD;  Location: [facility];  Service: Gynecology;  Laterality: N/A;   THYROIDECTOMY  2006   TOTAL DONE FOR GRAVES DISEASE   TONSILLECTOMY  AS CHILD    Family History  Problem Relation Age of Onset   Hypertension Father    Prostate cancer Father    Breast cancer Maternal Aunt    Stroke Maternal Grandfather    Diabetes Maternal Grandfather    Cancer Maternal Grandfather    Diabetes Paternal Grandfather    Colon cancer Neg Hx    Ovarian cancer Neg Hx    Endometrial cancer Neg Hx    Pancreatic cancer Neg Hx     Social History   Socioeconomic History   Marital status: Married    Spouse name: Not on file   Number of children: 0   Years of education: Not on file   Highest education level: Not on file  Occupational History   Occupation: insurance for [employer]  Tobacco Use   Smoking status: Never   Smokeless tobacco: Never  Vaping Use   Vaping Use: Never used  Substance and Sexual Activity   Alcohol use: No   Drug use: No   Sexual activity: Yes  Other Topics Concern   Not on file  Social History Narrative   Not on file   Social Determinants of Health   Financial Resource Strain: Low Risk  (11/05/2022)   Overall Financial Resource Strain (CARDIA)    Difficulty of Paying Living Expenses: Not hard at all  Food Insecurity: No Food Insecurity (01/05/2023)   Hunger Vital Sign    Worried About Running Out of Food in the Last Year: Never true    Ran Out of Food in the Last Year: Never true  Transportation Needs: No Transportation Needs (01/05/2023)   PRAPARE - Administrator, Civil Service (Medical): No    Lack of  Transportation (Non-Medical): No  Physical Activity: Not on file  Stress: Not on file  Social Connections: Not on file    Current Medications:  Current Outpatient Medications:    hydrocortisone 2.5 % cream, Apply topically 2 (two) times daily., Disp: 20 g, Rfl: 1   ibuprofen (ADVIL) 800 MG tablet, TAKE ONE TABLET BY MOUTH EVERY 8 HOURS AS NEEDED FOR MODERATE PAIN; FOR AFTER SURGERY ONLY, Disp: 30 tablet, Rfl: 0   norethindrone (ORTHO MICRONOR) 0.35 MG tablet, Take 1 tablet (0.35 mg total) by mouth daily., Disp: 28 tablet, Rfl: 11   NP THYROID 90 MG tablet, Take 90 mg by mouth daily. morning, Disp: , Rfl:    thyroid (ARMOUR) 60 MG tablet, Take 60 mg by mouth daily. afternoon, Disp: , Rfl:   Review of Systems: + Teague, frequency, discharge, light bleeding, cramping, back pain, itching of her incisions Denies appetite changes, fevers,  chills, fatigue, unexplained weight changes. Denies hearing loss, neck lumps or masses, mouth sores, ringing in ears or voice changes. Denies cough or wheezing.  Denies shortness of breath. Denies chest pain or palpitations. Denies leg swelling. Denies abdominal distention, pain, blood in stools, constipation, diarrhea, nausea, vomiting, or early satiety. Denies pain with intercourse, dysuria, frequency, hematuria or incontinence. Denies hot flashes, pelvic pain, vaginal bleeding or vaginal discharge.   Denies joint pain, back pain or muscle pain/cramps. Denies itching, rash, or wounds. Denies dizziness, headaches, numbness or seizures. Denies swollen lymph nodes or glands, denies easy bruising or bleeding. Denies anxiety, depression, confusion, or decreased concentration.  Physical Exam: BP 125/88 (BP Location: Left Arm, Patient Position: Sitting)   Pulse (!) 110   Temp (!) 96.5 F (35.8 C) (Axillary)   Resp 18   Wt 213 lb 9.6 oz (96.9 kg)   LMP 12/17/2022   SpO2 98%   BMI 35.54 kg/m  Pulse recheck 94 General: Alert, oriented, no acute  distress. HEENT: Normocephalic, atraumatic, sclera anicteric. Chest: Unlabored breathing on room air. Abdomen: soft, nontender.  Normoactive bowel sounds.  No masses or hepatosplenomegaly appreciated.  Well-healed incisions, several remaining mattress sutures excised.  Minimal erythema of 2 of her incisions.  No surrounding rash noted. Extremities: Grossly normal range of motion.  Warm, well perfused.  No edema bilaterally. GU: Normal appearing external genitalia without erythema, excoriation, or lesions.  Speculum exam reveals cuff intact, suture visible, minimal blood at the apex with no active bleeding but small area of the cuff that looks like may have been the source of small amount of bleeding.  This was treated with silver nitrate.  Bimanual exam reveals cuff intact, no fluctuance or tenderness with palpation.    Laboratory & Radiologic Studies: A. UTERUS, CERVIX, BILATERAL TUBES, LEFT OVARY, SALPINGO, OOPHORECTOMY:  - Left ovary with serous cystadenoma and hemorrhagic luteal cyst  - Cervix:     - Benign squamous mucosa; negative for dysplasia     - Benign endocervical mucosa and nabothian cysts  - Endometrium:     - Benign secretory type endometrium; negative for hyperplasia  - Myometrium:     - Adenomyosis     - Leiomyomata  - Serosal adhesions  - Bilateral, benign fimbriated fallopian tubes  - Negative for malignancy   FINAL MICROSCOPIC DIAGNOSIS:  - No malignant cells identified  - Reactive mesothelial cells present   Assessment & Plan: Avryl Sinibaldi is a 42 y.o. woman with Stage IV endometriosis s/p TRH/BS/LO who presents for follow-up.  Patient is doing well, meeting postoperative milestones.  We discussed continued expectations and restrictions.  Reviewed pathology report with her from surgery.  Although no mention of endometriosis within the pathology report, findings at the time of surgery were clearly consistent with endometriosis.  She was reviewed with her from  surgery and she was given a copy of these.  Given 1 remaining ovary, I would recommend some suppression with progesterone.  To help avoid side effects, prescription sent for low-dose norethindrone to her pharmacy.  Recommended that she follow-up with her OB/GYN within the next 6 months.  20 minutes of total time was spent for this patient encounter, including preparation, face-to-face counseling with the patient and coordination of care, and documentation of the encounter.  Eugene Garnet, MD  Division of Gynecologic Oncology  Department of Obstetrics and Gynecology  [facility]

## 2023-01-28 ENCOUNTER — Inpatient Hospital Stay: Payer: Federal, State, Local not specified - PPO | Attending: Gynecologic Oncology | Admitting: Gynecologic Oncology

## 2023-01-28 ENCOUNTER — Encounter: Payer: Self-pay | Admitting: Gynecologic Oncology

## 2023-01-28 VITALS — BP 125/88 | HR 96 | Temp 96.5°F | Resp 18 | Wt 213.6 lb

## 2023-01-28 DIAGNOSIS — N809 Endometriosis, unspecified: Secondary | ICD-10-CM

## 2023-01-28 DIAGNOSIS — R978 Other abnormal tumor markers: Secondary | ICD-10-CM

## 2023-01-28 DIAGNOSIS — Z7189 Other specified counseling: Secondary | ICD-10-CM

## 2023-01-28 DIAGNOSIS — N9489 Other specified conditions associated with female genital organs and menstrual cycle: Secondary | ICD-10-CM

## 2023-01-28 MED ORDER — NORETHINDRONE 0.35 MG PO TABS
1.0000 | ORAL_TABLET | Freq: Every day | ORAL | 11 refills | Status: DC
Start: 2023-01-28 — End: 2023-11-28

## 2023-01-28 NOTE — Patient Instructions (Signed)
It was great to see you today.  You are healing very well.  Please remember, no heavy lifting for at least 6 weeks after surgery and nothing in the vagina for at least 10-12.  I sent a prescription for progesterone only pill which will hopefully help suppress your ovary from developing any cyst related to endometriosis moving forward.  Please follow-up with your OB/GYN in the next 6 months or so.

## 2023-03-14 ENCOUNTER — Telehealth: Payer: Self-pay | Admitting: *Deleted

## 2023-03-14 NOTE — Telephone Encounter (Addendum)
Spoke with Meghan Leach who called the office stating she is having vaginal bleeding that is very light but it was bright red on Friday when she wiped. Pt is only wearing a panty liner and the discharge is now brown. Pt states she is not saturated the panty liner.   Meghan Leach will be 10 weeks post hysterectomy on July 24th. Patient denies pain, fever and or chills. She is only having occasional mild cramping. Patient denies any heavy lifting, and nothing in the vagina. Patient states she went back to work two weeks ago, but hasn't done any lifting.   Pt states she is going on vacation starting August 10 th and that will be shortly after her 12 weeks and wants to go swimming.  Pt advised that if she is still having vaginal discharge to avoid swimming pool or water submerging. And nothing per vagina.     Advised patient that her vaginal discharge is still part of the healing process but the office would forward her concerns to provider and office will call back with recommendations.

## 2023-04-24 ENCOUNTER — Encounter: Payer: Self-pay | Admitting: Internal Medicine

## 2023-04-25 ENCOUNTER — Other Ambulatory Visit: Payer: Self-pay | Admitting: Internal Medicine

## 2023-04-25 DIAGNOSIS — E890 Postprocedural hypothyroidism: Secondary | ICD-10-CM | POA: Insufficient documentation

## 2023-04-25 MED ORDER — THYROID 60 MG PO TABS: 60.0000 mg | ORAL_TABLET | Freq: Every day | ORAL | 0 refills | Status: DC

## 2023-04-25 MED ORDER — NP THYROID 90 MG PO TABS: 90.0000 mg | ORAL_TABLET | Freq: Every day | ORAL | 0 refills | Status: DC

## 2023-05-30 DIAGNOSIS — M2550 Pain in unspecified joint: Secondary | ICD-10-CM | POA: Diagnosis not present

## 2023-05-30 DIAGNOSIS — E039 Hypothyroidism, unspecified: Secondary | ICD-10-CM | POA: Diagnosis not present

## 2023-05-30 DIAGNOSIS — A493 Mycoplasma infection, unspecified site: Secondary | ICD-10-CM | POA: Diagnosis not present

## 2023-05-30 DIAGNOSIS — E559 Vitamin D deficiency, unspecified: Secondary | ICD-10-CM | POA: Diagnosis not present

## 2023-05-30 DIAGNOSIS — R000 Tachycardia, unspecified: Secondary | ICD-10-CM | POA: Diagnosis not present

## 2023-05-30 DIAGNOSIS — R5383 Other fatigue: Secondary | ICD-10-CM | POA: Diagnosis not present

## 2023-05-30 DIAGNOSIS — E7210 Disorders of sulfur-bearing amino-acid metabolism, unspecified: Secondary | ICD-10-CM | POA: Diagnosis not present

## 2023-06-13 DIAGNOSIS — E039 Hypothyroidism, unspecified: Secondary | ICD-10-CM | POA: Diagnosis not present

## 2023-06-13 DIAGNOSIS — R5383 Other fatigue: Secondary | ICD-10-CM | POA: Diagnosis not present

## 2023-06-13 DIAGNOSIS — M2550 Pain in unspecified joint: Secondary | ICD-10-CM | POA: Diagnosis not present

## 2023-06-13 DIAGNOSIS — N809 Endometriosis, unspecified: Secondary | ICD-10-CM | POA: Diagnosis not present

## 2023-06-20 DIAGNOSIS — M722 Plantar fascial fibromatosis: Secondary | ICD-10-CM | POA: Diagnosis not present

## 2023-06-20 DIAGNOSIS — M79672 Pain in left foot: Secondary | ICD-10-CM | POA: Diagnosis not present

## 2023-06-20 DIAGNOSIS — M7672 Peroneal tendinitis, left leg: Secondary | ICD-10-CM | POA: Diagnosis not present

## 2023-06-20 DIAGNOSIS — M79671 Pain in right foot: Secondary | ICD-10-CM | POA: Diagnosis not present

## 2023-06-27 ENCOUNTER — Other Ambulatory Visit: Payer: Self-pay | Admitting: Internal Medicine

## 2023-06-27 DIAGNOSIS — E890 Postprocedural hypothyroidism: Secondary | ICD-10-CM

## 2023-06-28 NOTE — Telephone Encounter (Signed)
Requested Prescriptions  Pending Prescriptions Disp Refills   NP THYROID 60 MG tablet [Pharmacy Med Name: NP THYROID 60 MG TABLET] 90 tablet 0    Sig: TAKE ONE TABLET BY MOUTH ONE TIME DAILY IN THE AFTERNOON     Endocrinology:  Hypothyroid Agents Failed - 06/27/2023 12:20 AM      Failed - TSH in normal range and within 360 days    TSH  Date Value Ref Range Status  11/01/2022 0.09 (A) 0.41 - 5.90 Final    Comment:    T4- 1.03         Passed - Valid encounter within last 12 months    Recent Outpatient Visits           7 months ago Pelvic pain   [facility] at [facility], [place], [place]   7 months ago History of thyroidectomy   [facility] at [facility] [place]oka Cowden, [place]

## 2023-07-18 DIAGNOSIS — M7672 Peroneal tendinitis, left leg: Secondary | ICD-10-CM | POA: Diagnosis not present

## 2023-09-24 DIAGNOSIS — S63502A Unspecified sprain of left wrist, initial encounter: Secondary | ICD-10-CM | POA: Diagnosis not present

## 2023-10-03 DIAGNOSIS — S63502A Unspecified sprain of left wrist, initial encounter: Secondary | ICD-10-CM | POA: Diagnosis not present

## 2023-11-22 ENCOUNTER — Encounter: Payer: Self-pay | Admitting: Internal Medicine

## 2023-11-23 NOTE — Telephone Encounter (Signed)
 Please review and advise.  Meghan Leach

## 2023-11-28 ENCOUNTER — Encounter: Payer: Self-pay | Admitting: Internal Medicine

## 2023-11-28 ENCOUNTER — Ambulatory Visit: Admitting: Internal Medicine

## 2023-11-28 VITALS — BP 118/76 | HR 78 | Ht 65.0 in | Wt 238.5 lb

## 2023-11-28 DIAGNOSIS — M722 Plantar fascial fibromatosis: Secondary | ICD-10-CM | POA: Insufficient documentation

## 2023-11-28 DIAGNOSIS — E66812 Obesity, class 2: Secondary | ICD-10-CM | POA: Diagnosis not present

## 2023-11-28 MED ORDER — NALTREXONE-BUPROPION HCL ER 8-90 MG PO TB12
ORAL_TABLET | ORAL | 1 refills | Status: DC
Start: 2023-11-28 — End: 2023-12-02

## 2023-11-28 MED ORDER — CELECOXIB 100 MG PO CAPS: 200.0000 mg | ORAL_CAPSULE | Freq: Every day | ORAL | 0 refills | Status: DC

## 2023-11-28 NOTE — Assessment & Plan Note (Signed)
 Significant weight gain since surgery. Thyroid is stable per Endo. Will try Contrave - coupled with a structured diet like [employer] and regular exercise she will have the best results. Recommend follow up in 2 months or at [facility]

## 2023-11-28 NOTE — Progress Notes (Signed)
 Date:  11/28/2023   Name:  Meghan Leach   DOB:  1981-02-15   MRN:  [id number]   Chief Complaint: Medication Consultation (Patient is here to talk about being prescribed contrave )  Foot Injury  There was no injury mechanism. The pain is present in the left foot and right foot. The quality of the pain is described as burning. The pain is moderate. The pain has been Fluctuating since onset. She has tried NSAIDs for the symptoms. The treatment provided significant relief.  Weight gain - she has gained about 35 lbs since her hysterectomy last year.  She did not exercise for several months then developed foot pain which limited her.  She is interested in try Contrave if possible.  She is not interested in [place].    Review of Systems  Constitutional:  Negative for chills, fatigue and fever.  HENT:  Negative for trouble swallowing.   Respiratory:  Negative for chest tightness and shortness of breath.   Cardiovascular:  Negative for chest pain and leg swelling.  Musculoskeletal:  Positive for gait problem (foot pain).  Neurological:  Negative for dizziness and headaches.  Psychiatric/Behavioral:  Negative for dysphoric mood and sleep disturbance. The patient is not nervous/anxious.      Lab Results  Component Value Date   NA 132 (L) 01/06/2023   K 4.0 01/06/2023   CO2 25 01/06/2023   GLUCOSE 113 (H) 01/06/2023   BUN 7 01/06/2023   CREATININE 0.56 01/06/2023   CALCIUM 7.8 (L) 01/06/2023   GFRNONAA >60 01/06/2023   No results found for: "CHOL", "HDL", "LDLCALC", "LDLDIRECT", "TRIG", "CHOLHDL" Lab Results  Component Value Date   TSH 0.09 (A) 11/01/2022   No results found for: "HGBA1C" Lab Results  Component Value Date   WBC 10.9 (H) 01/06/2023   HGB 12.1 01/06/2023   HCT 36.7 01/06/2023   MCV 80.1 01/06/2023   PLT 226 01/06/2023   Lab Results  Component Value Date   ALT 20 04/30/2021   AST 15 04/30/2021   ALKPHOS 67 04/30/2021   BILITOT 0.9 04/30/2021   No results  found for: "25OHVITD2", "25OHVITD3", "VD25OH"   Patient Active Problem List   Diagnosis Date Noted   Obesity, Class II, BMI 35-39.9 11/28/2023   Plantar fasciitis, bilateral 11/28/2023   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   S/P hysterectomy 01/05/2023   Migraine 12/11/2019   Abnormal cervical Papanicolaou smear 12/11/2019   History of thyroidectomy 12/11/2019    Allergies  Allergen Reactions   Metformin Other (See Comments)    Abdominal bloating/diarrhea   Lactose Diarrhea    Past Surgical History:  Procedure Laterality Date   BREAST BIOPSY Right    biopsy benign   DILATION AND EVACUATION N/A 11/22/2019   Procedure: DILATATION AND EVACUATION;  Surgeon: Zelphia Cairo, MD;  Location: [facility] [place];  Service: Gynecology;  Laterality: N/A;   OVARIAN CYST REMOVAL     in [place], 2013-14, lsc   ROBOTIC ASSISTED TOTAL HYSTERECTOMY WITH BILATERAL SALPINGO OOPHERECTOMY N/A 01/05/2023   Procedure: XI ROBOTIC ASSISTED TOTAL HYSTERECTOMY, UNILATERAL OOPHORECTOMY, BILATERAL SALPINGECTOMY, LYSIS OF ADHESIONS;  Surgeon: Carver Fila, MD;  Location: [facility];  Service: Gynecology;  Laterality: N/A;   THYROIDECTOMY  2006   TOTAL DONE FOR GRAVES DISEASE   TONSILLECTOMY  AS CHILD    Social History   Tobacco Use   Smoking status: Never   Smokeless tobacco: Never  Vaping Use   Vaping status: Never Used  Substance Use Topics  Alcohol use: No   Drug use: No     Medication list has been reviewed and updated.  Current Meds  Medication Sig   celecoxib (CELEBREX) 100 MG capsule Take 2 capsules (200 mg total) by mouth daily.   cholecalciferol (VITAMIN D3) 25 MCG (1000 UNIT) tablet Take 1,000 Units by mouth daily.   Naltrexone-buPROPion HCl ER 8-90 MG TB12 Start 1 tablet every morning for 7 days, then 1 tablet twice daily for 7 days, then 2 tablets every morning and one in the evening   NP THYROID 60 MG tablet TAKE ONE TABLET BY MOUTH ONE TIME DAILY IN THE  AFTERNOON   NP THYROID 90 MG tablet Take 1 tablet (90 mg total) by mouth daily. morning   UNABLE TO FIND Take by mouth in the morning and at bedtime. Med Name: methylation complete - bioactive methyl B-12,Folate & P5P   [DISCONTINUED] ibuprofen (ADVIL) 800 MG tablet TAKE ONE TABLET BY MOUTH EVERY 8 HOURS AS NEEDED FOR MODERATE PAIN; FOR AFTER SURGERY ONLY   [DISCONTINUED] meloxicam (MOBIC) 15 MG tablet Take 15 mg by mouth daily.       11/28/2023    9:25 AM 11/12/2022    3:54 PM 11/05/2022    3:36 PM  GAD 7 : Generalized Anxiety Score  Nervous, Anxious, on Edge 2 2 2   Control/stop worrying 0 2 2  Worry too much - different things 1 2 2   Trouble relaxing 1 1 2   Restless 0 0 0  Easily annoyed or irritable 0 0 0  Afraid - awful might happen 0 1 0  Total GAD 7 Score 4 8 8   Anxiety Difficulty Not difficult at all Not difficult at all Not difficult at all       11/28/2023    9:24 AM 11/12/2022    3:54 PM 11/08/2022   10:48 AM  Depression screen PHQ 2/9  Decreased Interest 1 1 0  Down, Depressed, Hopeless 0 0 0  PHQ - 2 Score 1 1 0  Altered sleeping 1 1   Tired, decreased energy 3 3   Change in appetite 1 0   Feeling bad or failure about yourself  1 0   Trouble concentrating 2 2   Moving slowly or fidgety/restless 0 0   Suicidal thoughts 0 0   PHQ-9 Score 9 7   Difficult doing work/chores Not difficult at all Not difficult at all     BP Readings from Last 3 Encounters:  11/28/23 118/76  01/28/23 125/88  01/06/23 119/78    Physical Exam Vitals and nursing note reviewed.  Constitutional:      General: She is not in acute distress.    Appearance: Normal appearance. She is well-developed. She is obese.  HENT:     Head: Normocephalic and atraumatic.  Cardiovascular:     Rate and Rhythm: Normal rate and regular rhythm.  Pulmonary:     Effort: Pulmonary effort is normal. No respiratory distress.     Breath sounds: No wheezing or rhonchi.  Musculoskeletal:     Cervical back:  Normal range of motion.     Right lower leg: No edema.     Left lower leg: No edema.  Skin:    General: Skin is warm and dry.     Findings: No rash.  Neurological:     Mental Status: She is alert and oriented to person, place, and time.  Psychiatric:        Mood and Affect: Mood normal.  Behavior: Behavior normal.     Wt Readings from Last 3 Encounters:  11/28/23 238 lb 8 oz (108.2 kg)  01/28/23 213 lb 9.6 oz (96.9 kg)  01/05/23 209 lb 14.1 oz (95.2 kg)    BP 118/76   Pulse 78   Ht 5\' 5"  (1.651 m)   Wt 238 lb 8 oz (108.2 kg)   LMP 12/17/2022   SpO2 95%   Breastfeeding No   BMI 39.69 kg/m   Assessment and Plan:  Problem List Items Addressed This Visit       Unprioritized   Obesity, Class II, BMI 35-39.9 (Chronic)   Significant weight gain since surgery. Thyroid is stable per Endo. Will try Contrave - coupled with a structured diet like [employer] and regular exercise she will have the best results. Recommend follow up in 2 months or at [facility]      Relevant Medications   Naltrexone-buPROPion HCl ER 8-90 MG TB12   Plantar fasciitis, bilateral - Primary   Improved with Celebrex and stretching.      Relevant Medications   celecoxib (CELEBREX) 100 MG capsule    Return for CPX.    Reubin Milan, MD [facility] [place]

## 2023-11-28 NOTE — Assessment & Plan Note (Signed)
 Improved with Celebrex and stretching.

## 2023-11-28 NOTE — Patient Instructions (Signed)
 Marland Kitchen

## 2023-12-02 ENCOUNTER — Other Ambulatory Visit: Payer: Self-pay

## 2023-12-02 ENCOUNTER — Ambulatory Visit: Admitting: Internal Medicine

## 2023-12-02 ENCOUNTER — Other Ambulatory Visit: Payer: Self-pay | Admitting: Internal Medicine

## 2023-12-02 ENCOUNTER — Telehealth: Payer: Self-pay

## 2023-12-02 DIAGNOSIS — E66812 Obesity, class 2: Secondary | ICD-10-CM

## 2023-12-02 MED ORDER — NALTREXONE-BUPROPION HCL ER 8-90 MG PO TB12: ORAL_TABLET | ORAL | 0 refills | Status: DC

## 2023-12-02 MED ORDER — NALTREXONE-BUPROPION HCL ER 8-90 MG PO TB12
ORAL_TABLET | ORAL | 1 refills | Status: DC
Start: 2023-12-02 — End: 2023-12-02

## 2023-12-02 NOTE — Telephone Encounter (Signed)
 Copied from CRM [id number]. Topic: Clinical - Prescription Issue >> Dec 02, 2023  1:24 PM Higinio Roger wrote: Reason for CRM: Patient would like Contrave (naltrexone/bupropion) sent to Preferred Pharmacy  Callback #: [phone]  Preferred Pharmacy: [facility] - [place], [place] - [address] [place] [address] Phone: [phone] Fax: [phone] Hours: Not open 24 hours

## 2023-12-02 NOTE — Telephone Encounter (Signed)
 Please review. It printed.   KP

## 2024-02-06 ENCOUNTER — Ambulatory Visit (INDEPENDENT_AMBULATORY_CARE_PROVIDER_SITE_OTHER): Admitting: Internal Medicine

## 2024-02-06 ENCOUNTER — Encounter: Payer: Self-pay | Admitting: Internal Medicine

## 2024-02-06 VITALS — BP 126/80 | HR 81 | Ht 65.0 in | Wt 233.0 lb

## 2024-02-06 DIAGNOSIS — Z1159 Encounter for screening for other viral diseases: Secondary | ICD-10-CM

## 2024-02-06 DIAGNOSIS — E559 Vitamin D deficiency, unspecified: Secondary | ICD-10-CM | POA: Diagnosis not present

## 2024-02-06 DIAGNOSIS — Z1231 Encounter for screening mammogram for malignant neoplasm of breast: Secondary | ICD-10-CM

## 2024-02-06 DIAGNOSIS — E66812 Obesity, class 2: Secondary | ICD-10-CM

## 2024-02-06 DIAGNOSIS — E890 Postprocedural hypothyroidism: Secondary | ICD-10-CM

## 2024-02-06 DIAGNOSIS — Z0000 Encounter for general adult medical examination without abnormal findings: Secondary | ICD-10-CM | POA: Diagnosis not present

## 2024-02-06 MED ORDER — THYROID 60 MG PO TABS: 60.0000 mg | ORAL_TABLET | Freq: Every day | ORAL | 1 refills | Status: AC

## 2024-02-06 MED ORDER — NP THYROID 90 MG PO TABS: 90.0000 mg | ORAL_TABLET | Freq: Every day | ORAL | 1 refills | Status: DC

## 2024-02-06 MED ORDER — CONTRAVE 8-90 MG PO TB12
2.0000 | ORAL_TABLET | Freq: Two times a day (BID) | ORAL | 1 refills | Status: DC
Start: 2024-02-06 — End: 2024-07-04

## 2024-02-06 NOTE — Progress Notes (Signed)
 Date:  02/06/2024   Name:  Meghan Leach   DOB:  March 07, 1981   MRN:  [id number]   Chief Complaint: Annual Exam Meghan Leach is a 43 y.o. female who presents today for her Complete Annual Exam. She feels fairly well. She reports exercising - no. She reports she is sleeping well. Breast complaints - none. She is taking Contrave  only 1 bid and has lost 5 lbs.  Health Maintenance  Topic Date Due   HPV Vaccine (1 - 3-dose series) Never done   HIV Screening  Never done   Hepatitis C Screening  Never done   DTaP/Tdap/Td vaccine (1 - Tdap) Never done   COVID-19 Vaccine (1 - 2024-25 season) Never done   Flu Shot  03/23/2024   Meningitis B Vaccine  Aged Out     Thyroid  Problem Presents for follow-up visit. Patient reports no anxiety, constipation, diarrhea, fatigue or palpitations. The symptoms have been stable.  Weight management - she is taking Contrave  without significant side effects.  Taking one tab bid and already has appetite suppression and some weight loss.   Review of Systems  Constitutional:  Negative for chills, fatigue and unexpected weight change.  HENT:  Negative for trouble swallowing.   Eyes:  Negative for visual disturbance.  Respiratory:  Negative for cough, chest tightness, shortness of breath and wheezing.   Cardiovascular:  Negative for chest pain, palpitations and leg swelling.  Gastrointestinal:  Negative for abdominal pain, constipation and diarrhea.  Genitourinary:  Negative for dysuria and hematuria.  Musculoskeletal:  Negative for arthralgias and myalgias.  Neurological:  Negative for dizziness, weakness, light-headedness and headaches.  Psychiatric/Behavioral:  Negative for dysphoric mood and sleep disturbance. The patient is not nervous/anxious.      Lab Results  Component Value Date   NA 132 (L) 01/06/2023   K 4.0 01/06/2023   CO2 25 01/06/2023   GLUCOSE 113 (H) 01/06/2023   BUN 7 01/06/2023   CREATININE 0.56 01/06/2023   CALCIUM 7.8 (L)  01/06/2023   GFRNONAA >60 01/06/2023   No results found for: CHOL, HDL, LDLCALC, LDLDIRECT, TRIG, CHOLHDL Lab Results  Component Value Date   TSH 0.09 (A) 11/01/2022   No results found for: HGBA1C Lab Results  Component Value Date   WBC 10.9 (H) 01/06/2023   HGB 12.1 01/06/2023   HCT 36.7 01/06/2023   MCV 80.1 01/06/2023   PLT 226 01/06/2023   Lab Results  Component Value Date   ALT 20 04/30/2021   AST 15 04/30/2021   ALKPHOS 67 04/30/2021   BILITOT 0.9 04/30/2021   No results found for: Lucetta Russel, VD25OH   Patient Active Problem List   Diagnosis Date Noted   Obesity, Class II, BMI 35-39.9 11/28/2023   Plantar fasciitis, bilateral 11/28/2023   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   S/P hysterectomy 01/05/2023   Migraine 12/11/2019   Abnormal cervical Papanicolaou smear 12/11/2019   History of thyroidectomy 12/11/2019    Allergies  Allergen Reactions   Metformin Other (See Comments)    Abdominal bloating/diarrhea   Lactose Diarrhea    Past Surgical History:  Procedure Laterality Date   BREAST BIOPSY Right    biopsy benign   DILATION AND EVACUATION N/A 11/22/2019   Procedure: DILATATION AND EVACUATION;  Surgeon: Ashby Lawman, MD;  Location: [facility];  Service: Gynecology;  Laterality: N/A;   OVARIAN CYST REMOVAL     in [place], 2013-14, lsc   ROBOTIC ASSISTED TOTAL HYSTERECTOMY WITH BILATERAL SALPINGO OOPHERECTOMY N/A  01/05/2023   Procedure: XI ROBOTIC ASSISTED TOTAL HYSTERECTOMY, UNILATERAL OOPHORECTOMY, BILATERAL SALPINGECTOMY, LYSIS OF ADHESIONS;  Surgeon: Suzi Essex, MD;  Location: [facility];  Service: Gynecology;  Laterality: N/A;   THYROIDECTOMY  2006   TOTAL DONE FOR GRAVES DISEASE   TONSILLECTOMY  AS CHILD    Social History   Tobacco Use   Smoking status: Never   Smokeless tobacco: Never  Vaping Use   Vaping status: Never Used  Substance Use Topics   Alcohol use: No   Drug use: No      Medication list has been reviewed and updated.  Current Meds  Medication Sig   celecoxib  (CELEBREX ) 100 MG capsule Take 2 capsules (200 mg total) by mouth daily.   cholecalciferol (VITAMIN D3) 25 MCG (1000 UNIT) tablet Take 1,000 Units by mouth daily.   Naltrexone -buPROPion  HCl ER (CONTRAVE ) 8-90 MG TB12 Take 2 tablets by mouth 2 (two) times daily. Start 1 tablet every morning for 7 days, then 1 tablet twice daily for 7 days, then 2 tablets every morning and one every evening   UNABLE TO FIND Take by mouth in the morning and at bedtime. Med Name: methylation complete - bioactive methyl B-12,Folate & P5P   [DISCONTINUED] Naltrexone -buPROPion  HCl ER 8-90 MG TB12 Start 1 tablet every morning for 7 days, then 1 tablet twice daily for 7 days, then 2 tablets every morning and one in the evening   [DISCONTINUED] Naltrexone -buPROPion  HCl ER 8-90 MG TB12 Take by mouth.   [DISCONTINUED] NP THYROID  60 MG tablet TAKE ONE TABLET BY MOUTH ONE TIME DAILY IN THE AFTERNOON   [DISCONTINUED] NP THYROID  90 MG tablet Take 1 tablet (90 mg total) by mouth daily. morning       02/06/2024    3:38 PM 11/28/2023    9:25 AM 11/12/2022    3:54 PM 11/05/2022    3:36 PM  GAD 7 : Generalized Anxiety Score  Nervous, Anxious, on Edge 0 2 2 2   Control/stop worrying 0 0 2 2  Worry too much - different things 0 1 2 2   Trouble relaxing 0 1 1 2   Restless 0 0 0 0  Easily annoyed or irritable 0 0 0 0  Afraid - awful might happen 0 0 1 0  Total GAD 7 Score 0 4 8 8   Anxiety Difficulty  Not difficult at all Not difficult at all Not difficult at all       02/06/2024    3:37 PM 11/28/2023    9:24 AM 11/12/2022    3:54 PM  Depression screen PHQ 2/9  Decreased Interest 0 1 1  Down, Depressed, Hopeless 0 0 0  PHQ - 2 Score 0 1 1  Altered sleeping 0 1 1  Tired, decreased energy 1 3 3   Change in appetite 0 1 0  Feeling bad or failure about yourself  0 1 0  Trouble concentrating 0 2 2  Moving slowly or fidgety/restless 0 0  0  Suicidal thoughts 0 0 0  PHQ-9 Score 1 9 7   Difficult doing work/chores  Not difficult at all Not difficult at all    BP Readings from Last 3 Encounters:  02/06/24 126/80  11/28/23 118/76  01/28/23 125/88    Physical Exam Vitals and nursing note reviewed.  Constitutional:      General: She is not in acute distress.    Appearance: Normal appearance. She is well-developed.  HENT:     Head: Normocephalic and atraumatic.  Right Ear: Tympanic membrane and ear canal normal.     Left Ear: Tympanic membrane and ear canal normal.     Nose:     Right Sinus: No maxillary sinus tenderness.     Left Sinus: No maxillary sinus tenderness.   Eyes:     General: No scleral icterus.       Right eye: No discharge.        Left eye: No discharge.     Conjunctiva/sclera: Conjunctivae normal.   Neck:     Thyroid : No thyromegaly.     Vascular: No carotid bruit.   Cardiovascular:     Rate and Rhythm: Normal rate and regular rhythm.     Pulses: Normal pulses.     Heart sounds: Normal heart sounds.  Pulmonary:     Effort: Pulmonary effort is normal. No respiratory distress.     Breath sounds: No wheezing.  Abdominal:     General: Bowel sounds are normal.     Palpations: Abdomen is soft.     Tenderness: There is no abdominal tenderness.   Musculoskeletal:     Cervical back: Normal range of motion. No erythema.     Right lower leg: No edema.     Left lower leg: No edema.  Lymphadenopathy:     Cervical: No cervical adenopathy.   Skin:    General: Skin is warm and dry.     Capillary Refill: Capillary refill takes less than 2 seconds.     Findings: No rash.   Neurological:     General: No focal deficit present.     Mental Status: She is alert and oriented to person, place, and time.     Cranial Nerves: No cranial nerve deficit.     Sensory: No sensory deficit.     Deep Tendon Reflexes: Reflexes are normal and symmetric.   Psychiatric:        Attention and Perception: Attention  normal.        Mood and Affect: Mood normal.     Wt Readings from Last 3 Encounters:  02/06/24 233 lb (105.7 kg)  11/28/23 238 lb 8 oz (108.2 kg)  01/28/23 213 lb 9.6 oz (96.9 kg)    BP 126/80   Pulse 81   Ht 5' 5 (1.651 m)   Wt 233 lb (105.7 kg)   LMP 12/17/2022   SpO2 98%   BMI 38.77 kg/m   Assessment and Plan:  Problem List Items Addressed This Visit       Unprioritized   Post-surgical hypothyroidism (Chronic)   Relevant Medications   thyroid  (NP THYROID ) 60 MG tablet   NP THYROID  90 MG tablet   Other Relevant Orders   Hemoglobin A1c   TSH + free T4   Obesity, Class II, BMI 35-39.9 (Chronic)   Relevant Medications   Naltrexone -buPROPion  HCl ER (CONTRAVE ) 8-90 MG TB12   Other Visit Diagnoses       Annual physical exam    -  Primary   Relevant Orders   CBC with Differential/Platelet   Comprehensive metabolic panel with GFR   Hemoglobin A1c   Lipid panel   TSH + free T4   Hepatitis C antibody   VITAMIN D 25 Hydroxy (Vit-D Deficiency, Fractures)     Encounter for screening mammogram for breast cancer       schedule at [facility]   Relevant Orders   MM 3D SCREENING MAMMOGRAM BILATERAL BREAST     Need for hepatitis C screening test  Relevant Orders   Hepatitis C antibody     Vitamin D deficiency       Relevant Orders   VITAMIN D 25 Hydroxy (Vit-D Deficiency, Fractures)       Return in about 3 months (around 05/08/2024) for wt follow up.    Sheron Dixons, MD [facility] [place]

## 2024-02-06 NOTE — Patient Instructions (Signed)
 Call [facility] to schedule your mammogram at [phone].

## 2024-02-07 ENCOUNTER — Ambulatory Visit: Payer: Self-pay | Admitting: Internal Medicine

## 2024-02-07 LAB — CBC WITH DIFFERENTIAL/PLATELET
Basophils Absolute: 0.1 10*3/uL (ref 0.0–0.2)
Basos: 1 %
EOS (ABSOLUTE): 0.2 10*3/uL (ref 0.0–0.4)
Eos: 2 %
Hematocrit: 44.8 % (ref 34.0–46.6)
Hemoglobin: 14.5 g/dL (ref 11.1–15.9)
Immature Grans (Abs): 0 10*3/uL (ref 0.0–0.1)
Immature Granulocytes: 0 %
Lymphocytes Absolute: 1.5 10*3/uL (ref 0.7–3.1)
Lymphs: 14 %
MCH: 27.5 pg (ref 26.6–33.0)
MCHC: 32.4 g/dL (ref 31.5–35.7)
MCV: 85 fL (ref 79–97)
Monocytes Absolute: 0.6 10*3/uL (ref 0.1–0.9)
Monocytes: 5 %
Neutrophils Absolute: 8.6 10*3/uL — ABNORMAL HIGH (ref 1.4–7.0)
Neutrophils: 78 %
Platelets: 279 10*3/uL (ref 150–450)
RBC: 5.27 x10E6/uL (ref 3.77–5.28)
RDW: 13.1 % (ref 11.7–15.4)
WBC: 10.9 10*3/uL — ABNORMAL HIGH (ref 3.4–10.8)

## 2024-02-07 LAB — COMPREHENSIVE METABOLIC PANEL WITH GFR
ALT: 12 IU/L (ref 0–32)
AST: 16 IU/L (ref 0–40)
Albumin: 4.4 g/dL (ref 3.9–4.9)
Alkaline Phosphatase: 76 IU/L (ref 44–121)
BUN/Creatinine Ratio: 18 (ref 9–23)
BUN: 11 mg/dL (ref 6–24)
Bilirubin Total: 0.3 mg/dL (ref 0.0–1.2)
CO2: 21 mmol/L (ref 20–29)
Calcium: 9.2 mg/dL (ref 8.7–10.2)
Chloride: 102 mmol/L (ref 96–106)
Creatinine, Ser: 0.6 mg/dL (ref 0.57–1.00)
Globulin, Total: 1.9 g/dL (ref 1.5–4.5)
Glucose: 79 mg/dL (ref 70–99)
Potassium: 4.1 mmol/L (ref 3.5–5.2)
Sodium: 140 mmol/L (ref 134–144)
Total Protein: 6.3 g/dL (ref 6.0–8.5)
eGFR: 113 mL/min/1.73

## 2024-02-07 LAB — HEPATITIS C ANTIBODY: Hep C Virus Ab: NONREACTIVE

## 2024-02-07 LAB — LIPID PANEL
Chol/HDL Ratio: 2.7 ratio (ref 0.0–4.4)
Cholesterol, Total: 147 mg/dL (ref 100–199)
HDL: 55 mg/dL
LDL Chol Calc (NIH): 66 mg/dL (ref 0–99)
Triglycerides: 151 mg/dL — ABNORMAL HIGH (ref 0–149)
VLDL Cholesterol Cal: 26 mg/dL (ref 5–40)

## 2024-02-07 LAB — HEMOGLOBIN A1C
Est. average glucose Bld gHb Est-mCnc: 94 mg/dL
Hgb A1c MFr Bld: 4.9 % (ref 4.8–5.6)

## 2024-02-07 LAB — TSH+FREE T4
Free T4: 0.98 ng/dL (ref 0.82–1.77)
TSH: 0.017 u[IU]/mL — ABNORMAL LOW (ref 0.450–4.500)

## 2024-02-07 LAB — VITAMIN D 25 HYDROXY (VIT D DEFICIENCY, FRACTURES): Vit D, 25-Hydroxy: 40.7 ng/mL (ref 30.0–100.0)

## 2024-05-14 ENCOUNTER — Ambulatory Visit: Admitting: Internal Medicine

## 2024-07-03 ENCOUNTER — Encounter: Payer: Self-pay | Admitting: Internal Medicine

## 2024-07-03 ENCOUNTER — Ambulatory Visit: Admitting: Internal Medicine

## 2024-07-03 VITALS — BP 126/74 | HR 112 | Temp 99.2°F | Ht 65.0 in | Wt 233.0 lb

## 2024-07-03 DIAGNOSIS — B029 Zoster without complications: Secondary | ICD-10-CM | POA: Diagnosis not present

## 2024-07-03 MED ORDER — VALACYCLOVIR HCL 1 G PO TABS: 1000.0000 mg | ORAL_TABLET | Freq: Three times a day (TID) | ORAL | 0 refills | Status: AC

## 2024-07-03 MED ORDER — GABAPENTIN 100 MG PO CAPS: 100.0000 mg | ORAL_CAPSULE | Freq: Three times a day (TID) | ORAL | 0 refills | Status: AC

## 2024-07-03 NOTE — Progress Notes (Signed)
 Date:  07/03/2024   Name:  Meghan Leach   DOB:  1981-08-12   MRN:  [id number]   Chief Complaint: Rash (Severely painful rash in between buttocks x 3 days., Started with pain in butt bone and now rash is spreading painfully. Hurts to sit. Low grade fever. Feels malaise. Pt would like work note for a few days to rest at home since she sits most days at work [employer]))  Rash This is a new problem. Episode onset: three days ago. The affected locations include the left buttock. The rash is characterized by blistering, itchiness and pain. Associated symptoms include fatigue. Pertinent negatives include no fever or shortness of breath. Past treatments include nothing.    Review of Systems  Constitutional:  Positive for fatigue. Negative for chills and fever.  Respiratory:  Negative for chest tightness and shortness of breath.   Cardiovascular:  Negative for chest pain and palpitations.  Skin:  Positive for rash.  Neurological:  Positive for headaches.     Lab Results  Component Value Date   NA 140 02/06/2024   K 4.1 02/06/2024   CO2 21 02/06/2024   GLUCOSE 79 02/06/2024   BUN 11 02/06/2024   CREATININE 0.60 02/06/2024   CALCIUM 9.2 02/06/2024   EGFR 113 02/06/2024   GFRNONAA >60 01/06/2023   Lab Results  Component Value Date   CHOL 147 02/06/2024   HDL 55 02/06/2024   LDLCALC 66 02/06/2024   TRIG 151 (H) 02/06/2024   CHOLHDL 2.7 02/06/2024   Lab Results  Component Value Date   TSH 0.017 (L) 02/06/2024   Lab Results  Component Value Date   HGBA1C 4.9 02/06/2024   Lab Results  Component Value Date   WBC 10.9 (H) 02/06/2024   HGB 14.5 02/06/2024   HCT 44.8 02/06/2024   MCV 85 02/06/2024   PLT 279 02/06/2024   Lab Results  Component Value Date   ALT 12 02/06/2024   AST 16 02/06/2024   ALKPHOS 76 02/06/2024   BILITOT 0.3 02/06/2024   Lab Results  Component Value Date   VD25OH 40.7 02/06/2024     Patient Active Problem List   Diagnosis  Date Noted   Obesity, Class II, BMI 35-39.9 11/28/2023   Plantar fasciitis, bilateral 11/28/2023   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   S/P hysterectomy 01/05/2023   Migraine 12/11/2019   Abnormal cervical Papanicolaou smear 12/11/2019   History of thyroidectomy 12/11/2019    Allergies  Allergen Reactions   Metformin Other (See Comments)    Abdominal bloating/diarrhea   Lactose Diarrhea    Past Surgical History:  Procedure Laterality Date   BREAST BIOPSY Right    biopsy benign   DILATION AND EVACUATION N/A 11/22/2019   Procedure: DILATATION AND EVACUATION;  Surgeon: Latisha Medford, MD;  Location: [facility];  Service: Gynecology;  Laterality: N/A;   OVARIAN CYST REMOVAL     in [place], 2013-14, lsc   ROBOTIC ASSISTED TOTAL HYSTERECTOMY WITH BILATERAL SALPINGO OOPHERECTOMY N/A 01/05/2023   Procedure: XI ROBOTIC ASSISTED TOTAL HYSTERECTOMY, UNILATERAL OOPHORECTOMY, BILATERAL SALPINGECTOMY, LYSIS OF ADHESIONS;  Surgeon: Viktoria Comer SAUNDERS, MD;  Location: [facility];  Service: Gynecology;  Laterality: N/A;   THYROIDECTOMY  2006   TOTAL DONE FOR GRAVES DISEASE   TONSILLECTOMY  AS CHILD    Social History   Tobacco Use   Smoking status: Never   Smokeless tobacco: Never  Vaping Use   Vaping status: Never Used  Substance Use Topics   Alcohol  use: No   Drug use: No     Medication list has been reviewed and updated.  Current Meds  Medication Sig   celecoxib  (CELEBREX ) 100 MG capsule Take 2 capsules (200 mg total) by mouth daily.   cholecalciferol (VITAMIN D3) 25 MCG (1000 UNIT) tablet Take 1,000 Units by mouth daily.   gabapentin  (NEURONTIN ) 100 MG capsule Take 1-3 capsules (100-300 mg total) by mouth 3 (three) times daily.   Naltrexone -buPROPion  HCl ER (CONTRAVE ) 8-90 MG TB12 Take 2 tablets by mouth 2 (two) times daily. Start 1 tablet every morning for 7 days, then 1 tablet twice daily for 7 days, then 2 tablets every morning and one every evening   NP  THYROID  90 MG tablet Take 1 tablet (90 mg total) by mouth daily. morning   thyroid  (NP THYROID ) 60 MG tablet Take 1 tablet (60 mg total) by mouth daily before breakfast.   UNABLE TO FIND Take by mouth in the morning and at bedtime. Med Name: methylation complete - bioactive methyl B-12,Folate & P5P   valACYclovir (VALTREX) 1000 MG tablet Take 1 tablet (1,000 mg total) by mouth 3 (three) times daily for 10 days.       07/03/2024   10:30 AM 02/06/2024    3:38 PM 11/28/2023    9:25 AM 11/12/2022    3:54 PM  GAD 7 : Generalized Anxiety Score  Nervous, Anxious, on Edge 0 0 2 2  Control/stop worrying 0 0 0 2  Worry too much - different things 0 0 1 2  Trouble relaxing 0 0 1 1  Restless 0 0 0 0  Easily annoyed or irritable 0 0 0 0  Afraid - awful might happen 0 0 0 1  Total GAD 7 Score 0 0 4 8  Anxiety Difficulty Not difficult at all  Not difficult at all Not difficult at all       07/03/2024   10:30 AM 02/06/2024    3:37 PM 11/28/2023    9:24 AM  Depression screen PHQ 2/9  Decreased Interest 0 0 1  Down, Depressed, Hopeless 0 0 0  PHQ - 2 Score 0 0 1  Altered sleeping 0 0 1  Tired, decreased energy 0 1 3  Change in appetite 0 0 1  Feeling bad or failure about yourself  0 0 1  Trouble concentrating 0 0 2  Moving slowly or fidgety/restless 0 0 0  Suicidal thoughts 0 0 0  PHQ-9 Score 0 1  9   Difficult doing work/chores Not difficult at all  Not difficult at all     Data saved with a previous flowsheet row definition    BP Readings from Last 3 Encounters:  07/03/24 126/74  02/06/24 126/80  11/28/23 118/76    Physical Exam Vitals and nursing note reviewed.  Constitutional:      General: She is not in acute distress.    Appearance: She is well-developed.  HENT:     Head: Normocephalic and atraumatic.  Pulmonary:     Effort: Pulmonary effort is normal. No respiratory distress.  Skin:    General: Skin is warm and dry.     Findings: Rash present.         Comments: Red  blistering painful rash  Neurological:     Mental Status: She is alert and oriented to person, place, and time.  Psychiatric:        Mood and Affect: Mood normal.        Behavior: Behavior  normal.     Wt Readings from Last 3 Encounters:  07/03/24 233 lb (105.7 kg)  02/06/24 233 lb (105.7 kg)  11/28/23 238 lb 8 oz (108.2 kg)    BP 126/74   Pulse (!) 112   Temp 99.2 F (37.3 C) (Oral)   Ht 5' 5 (1.651 m)   Wt 233 lb (105.7 kg)   LMP 12/17/2022   SpO2 97%   BMI 38.77 kg/m   Assessment and Plan:  Problem List Items Addressed This Visit   None Visit Diagnoses       Herpes zoster without complication    -  Primary   Valtrex x 10 days gabapentin  for pain note for work this week   Relevant Medications   valACYclovir (VALTREX) 1000 MG tablet   gabapentin  (NEURONTIN ) 100 MG capsule       No follow-ups on file.    Leita HILARIO Adie, MD [facility] [place]

## 2024-07-03 NOTE — Patient Instructions (Signed)
 Take gabapentin  100 -300 mg three times per day - start with 100 mg and gradually increase as needed.  Can take more at bedtime to help with sleep.  Pressure relieving cushion may be helpful.

## 2024-07-04 ENCOUNTER — Encounter: Payer: Self-pay | Admitting: Internal Medicine

## 2024-07-04 ENCOUNTER — Other Ambulatory Visit: Payer: Self-pay | Admitting: Internal Medicine

## 2024-07-04 DIAGNOSIS — B029 Zoster without complications: Secondary | ICD-10-CM

## 2024-07-04 MED ORDER — TRAMADOL HCL 50 MG PO TABS: 50.0000 mg | ORAL_TABLET | Freq: Four times a day (QID) | ORAL | 0 refills | Status: AC | PRN

## 2024-07-04 MED ORDER — ONDANSETRON HCL 4 MG PO TABS: 4.0000 mg | ORAL_TABLET | Freq: Three times a day (TID) | ORAL | 1 refills | Status: AC | PRN

## 2024-07-04 NOTE — Progress Notes (Unsigned)
 Date:  07/04/2024   Name:  Robynne Roat   DOB:  [birth year]   MRN:  [id number]   Chief Complaint: No chief complaint on file.  HPI  Review of Systems   Lab Results  Component Value Date   NA 140 02/06/2024   K 4.1 02/06/2024   CO2 21 02/06/2024   GLUCOSE 79 02/06/2024   BUN 11 02/06/2024   CREATININE 0.60 02/06/2024   CALCIUM 9.2 02/06/2024   EGFR 113 02/06/2024   GFRNONAA >60 01/06/2023   Lab Results  Component Value Date   CHOL 147 02/06/2024   HDL 55 02/06/2024   LDLCALC 66 02/06/2024   TRIG 151 (H) 02/06/2024   CHOLHDL 2.7 02/06/2024   Lab Results  Component Value Date   TSH 0.017 (L) 02/06/2024   Lab Results  Component Value Date   HGBA1C 4.9 02/06/2024   Lab Results  Component Value Date   WBC 10.9 (H) 02/06/2024   HGB 14.5 02/06/2024   HCT 44.8 02/06/2024   MCV 85 02/06/2024   PLT 279 02/06/2024   Lab Results  Component Value Date   ALT 12 02/06/2024   AST 16 02/06/2024   ALKPHOS 76 02/06/2024   BILITOT 0.3 02/06/2024   Lab Results  Component Value Date   VD25OH 40.7 02/06/2024     Patient Active Problem List   Diagnosis Date Noted   Obesity, Class II, BMI 35-39.9 11/28/2023   Plantar fasciitis, bilateral 11/28/2023   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   S/P hysterectomy 01/05/2023   Migraine 12/11/2019   Abnormal cervical Papanicolaou smear 12/11/2019   History of thyroidectomy 12/11/2019    Allergies  Allergen Reactions   Metformin Other (See Comments)    Abdominal bloating/diarrhea   Lactose Diarrhea    Past Surgical History:  Procedure Laterality Date   BREAST BIOPSY Right    biopsy benign   DILATION AND EVACUATION N/A 11/22/2019   Procedure: DILATATION AND EVACUATION;  Surgeon: Latisha Medford, MD;  Location: [facility];  Service: Gynecology;  Laterality: N/A;   OVARIAN CYST REMOVAL     in [place], 2013-14, lsc   ROBOTIC ASSISTED TOTAL HYSTERECTOMY WITH BILATERAL SALPINGO OOPHERECTOMY N/A  01/05/2023   Procedure: XI ROBOTIC ASSISTED TOTAL HYSTERECTOMY, UNILATERAL OOPHORECTOMY, BILATERAL SALPINGECTOMY, LYSIS OF ADHESIONS;  Surgeon: Viktoria Comer SAUNDERS, MD;  Location: [facility];  Service: Gynecology;  Laterality: N/A;   THYROIDECTOMY  2006   TOTAL DONE FOR GRAVES DISEASE   TONSILLECTOMY  AS CHILD    Social History   Tobacco Use   Smoking status: Never   Smokeless tobacco: Never  Vaping Use   Vaping status: Never Used  Substance Use Topics   Alcohol use: No   Drug use: No     Medication list has been reviewed and updated.  No outpatient medications have been marked as taking for the 07/04/24 encounter (Orders Only) with Justus Leita DEL, MD.       07/03/2024   10:30 AM 02/06/2024    3:38 PM 11/28/2023    9:25 AM 11/12/2022    3:54 PM  GAD 7 : Generalized Anxiety Score  Nervous, Anxious, on Edge 0 0 2 2  Control/stop worrying 0 0 0 2  Worry too much - different things 0 0 1 2  Trouble relaxing 0 0 1 1  Restless 0 0 0 0  Easily annoyed or irritable 0 0 0 0  Afraid - awful might happen 0 0 0 1  Total GAD 7 Score  0 0 4 8  Anxiety Difficulty Not difficult at all  Not difficult at all Not difficult at all       07/03/2024   10:30 AM 02/06/2024    3:37 PM 11/28/2023    9:24 AM  Depression screen PHQ 2/9  Decreased Interest 0 0 1  Down, Depressed, Hopeless 0 0 0  PHQ - 2 Score 0 0 1  Altered sleeping 0 0 1  Tired, decreased energy 0 1 3  Change in appetite 0 0 1  Feeling bad or failure about yourself  0 0 1  Trouble concentrating 0 0 2  Moving slowly or fidgety/restless 0 0 0  Suicidal thoughts 0 0 0  PHQ-9 Score 0 1  9   Difficult doing work/chores Not difficult at all  Not difficult at all     Data saved with a previous flowsheet row definition    BP Readings from Last 3 Encounters:  07/03/24 126/74  02/06/24 126/80  11/28/23 118/76    Physical Exam  Wt Readings from Last 3 Encounters:  07/03/24 233 lb (105.7 kg)  02/06/24 233 lb (105.7 kg)   11/28/23 238 lb 8 oz (108.2 kg)    LMP 12/17/2022   Assessment and Plan:  Problem List Items Addressed This Visit   None   No follow-ups on file.    Leita HILARIO Adie, MD [facility] [place]

## 2024-07-15 ENCOUNTER — Encounter: Payer: Self-pay | Admitting: Internal Medicine

## 2024-07-16 ENCOUNTER — Ambulatory Visit: Admitting: Internal Medicine

## 2024-08-01 DIAGNOSIS — M25561 Pain in right knee: Secondary | ICD-10-CM | POA: Diagnosis not present

## 2024-08-08 ENCOUNTER — Other Ambulatory Visit: Payer: Self-pay | Admitting: Internal Medicine

## 2024-08-08 DIAGNOSIS — B029 Zoster without complications: Secondary | ICD-10-CM

## 2024-08-08 DIAGNOSIS — M2241 Chondromalacia patellae, right knee: Secondary | ICD-10-CM | POA: Diagnosis not present

## 2024-08-09 ENCOUNTER — Telehealth: Payer: Self-pay

## 2024-08-09 NOTE — Telephone Encounter (Signed)
 Please review.  KP

## 2024-08-10 ENCOUNTER — Other Ambulatory Visit: Payer: Self-pay | Admitting: Internal Medicine

## 2024-08-10 DIAGNOSIS — M722 Plantar fascial fibromatosis: Secondary | ICD-10-CM

## 2024-08-10 NOTE — Telephone Encounter (Unsigned)
 Copied from CRM #[id number]. Topic: Clinical - Medication Refill >> Aug 10, 2024  9:45 AM Franky GRADE wrote: Medication: celecoxib  (CELEBREX ) 100 MG capsule [[id number]]  Has the patient contacted their pharmacy? Yes, pharmacy calling to place the request.  (Agent: If no, request that the patient contact the pharmacy for the refill. If patient does not wish to contact the pharmacy document the reason why and proceed with request.) (Agent: If yes, when and what did the pharmacy advise?)  This is the patient's preferred pharmacy:   [id number] [place], [place] - [address] [place] [address] Phone: [phone] Fax: [phone]  Is this the correct pharmacy for this prescription? Yes If no, delete pharmacy and type the correct one.   Has the prescription been filled recently? No  Is the patient out of the medication? Yes  Has the patient been seen for an appointment in the last year OR does the patient have an upcoming appointment? Yes  Can we respond through MyChart? Yes  Agent: Please be advised that Rx refills may take up to 3 business days. We ask that you follow-up with your pharmacy.

## 2024-08-14 MED ORDER — CELECOXIB 100 MG PO CAPS: 200.0000 mg | ORAL_CAPSULE | Freq: Every day | ORAL | 0 refills | Status: AC

## 2024-08-14 NOTE — Telephone Encounter (Signed)
 Requested Prescriptions  Pending Prescriptions Disp Refills   celecoxib  (CELEBREX ) 100 MG capsule 180 capsule 0    Sig: Take 2 capsules (200 mg total) by mouth daily.     Analgesics:  COX2 Inhibitors Failed - 08/14/2024  2:01 PM      Failed - Manual Review: Labs are only required if the patient has taken medication for more than 8 weeks.      Passed - HGB in normal range and within 360 days    Hemoglobin  Date Value Ref Range Status  02/06/2024 14.5 11.1 - 15.9 g/dL Final         Passed - Cr in normal range and within 360 days    Creatinine, Ser  Date Value Ref Range Status  02/06/2024 0.60 0.57 - 1.00 mg/dL Final         Passed - HCT in normal range and within 360 days    Hematocrit  Date Value Ref Range Status  02/06/2024 44.8 34.0 - 46.6 % Final         Passed - AST in normal range and within 360 days    AST  Date Value Ref Range Status  02/06/2024 16 0 - 40 IU/L Final         Passed - ALT in normal range and within 360 days    ALT  Date Value Ref Range Status  02/06/2024 12 0 - 32 IU/L Final         Passed - eGFR is 30 or above and within 360 days    GFR calc Af Amer  Date Value Ref Range Status  10/22/2017 >60 >60 mL/min Final    Comment:    (NOTE) The eGFR has been calculated using the CKD EPI equation. This calculation has not been validated in all clinical situations. eGFR's persistently <60 mL/min signify possible Chronic Kidney Disease.    GFR, Estimated  Date Value Ref Range Status  01/06/2023 >60 >60 mL/min Final    Comment:    (NOTE) Calculated using the CKD-EPI Creatinine Equation (2021)    eGFR  Date Value Ref Range Status  02/06/2024 113 >59 mL/min/1.73 Final         Passed - Patient is not pregnant      Passed - Valid encounter within last 12 months    Recent Outpatient Visits           1 month ago Herpes zoster without complication   [place] [facility] at [facility], Leita DEL, MD   6 months  ago Annual physical exam   [facility] at [facility], Leita DEL, MD   8 months ago Plantar fasciitis, bilateral   [facility] at [facility], Leita DEL, MD

## 2024-08-24 ENCOUNTER — Other Ambulatory Visit: Payer: Self-pay | Admitting: Internal Medicine

## 2024-08-24 DIAGNOSIS — B029 Zoster without complications: Secondary | ICD-10-CM

## 2024-08-24 NOTE — Telephone Encounter (Signed)
 Requested medications are due for refill today.  yes  Requested medications are on the active medications list.  yes  Last refill. 08/10/2024 #120 0 rf  Future visit scheduled.   no  Notes to clinic.  Called pt and lm to call back and schedule TOC appt.    Requested Prescriptions  Pending Prescriptions Disp Refills   gabapentin  (NEURONTIN ) 100 MG capsule [Pharmacy Med Name: GABAPENTIN  100 MG CAPSULE] 120 capsule 0    Sig: TAKE 1-3 CAPSULES BY MOUTH 3 TIMES A DAY     Neurology: Anticonvulsants - gabapentin  Passed - 08/24/2024  2:10 PM      Passed - Cr in normal range and within 360 days    Creatinine, Ser  Date Value Ref Range Status  02/06/2024 0.60 0.57 - 1.00 mg/dL Final         Passed - Completed PHQ-2 or PHQ-9 in the last 360 days      Passed - Valid encounter within last 12 months    Recent Outpatient Visits           1 month ago Herpes zoster without complication   [place] [facility] at [facility], Leita DEL, MD   6 months ago Annual physical exam   [facility] at [facility], Leita DEL, MD   9 months ago Plantar fasciitis, bilateral   [facility] at [facility], Leita DEL, MD

## 2024-08-24 NOTE — Telephone Encounter (Signed)
 Called pt and left message to call back and schedule TOC.

## 2024-09-18 ENCOUNTER — Other Ambulatory Visit: Payer: Self-pay

## 2024-09-18 DIAGNOSIS — E890 Postprocedural hypothyroidism: Secondary | ICD-10-CM

## 2024-09-18 MED ORDER — NP THYROID 90 MG PO TABS: 90.0000 mg | ORAL_TABLET | Freq: Every day | ORAL | 0 refills | Status: AC

## 2024-10-05 ENCOUNTER — Ambulatory Visit: Admitting: Family Medicine

## 2024-10-19 ENCOUNTER — Ambulatory Visit: Admitting: Family Medicine
